# Patient Record
Sex: Male | Born: 1950 | Race: White | Hispanic: No | Marital: Married | Smoking: Never smoker
Health system: Southern US, Community
[De-identification: ages and names within clinical notes are randomized; demographics above are authoritative.]

## PROBLEM LIST (undated history)

## (undated) VITALS — BP 117/72 | HR 86 | Temp 97.0°F | Resp 18 | Ht 73.0 in | Wt 202.0 lb

## (undated) DIAGNOSIS — I469 Cardiac arrest, cause unspecified: Secondary | ICD-10-CM

## (undated) DIAGNOSIS — L0390 Cellulitis, unspecified: Secondary | ICD-10-CM

## (undated) DIAGNOSIS — F32A Depression, unspecified: Secondary | ICD-10-CM

## (undated) DIAGNOSIS — J45909 Unspecified asthma, uncomplicated: Secondary | ICD-10-CM

## (undated) DIAGNOSIS — F419 Anxiety disorder, unspecified: Secondary | ICD-10-CM

## (undated) DIAGNOSIS — J329 Chronic sinusitis, unspecified: Secondary | ICD-10-CM

## (undated) DIAGNOSIS — X838XXA Intentional self-harm by other specified means, initial encounter: Secondary | ICD-10-CM

## (undated) DIAGNOSIS — G4733 Obstructive sleep apnea (adult) (pediatric): Secondary | ICD-10-CM

## (undated) DIAGNOSIS — T7840XA Allergy, unspecified, initial encounter: Secondary | ICD-10-CM

## (undated) DIAGNOSIS — H269 Unspecified cataract: Secondary | ICD-10-CM

## (undated) DIAGNOSIS — F319 Bipolar disorder, unspecified: Secondary | ICD-10-CM

## (undated) DIAGNOSIS — M1990 Unspecified osteoarthritis, unspecified site: Secondary | ICD-10-CM

## (undated) DIAGNOSIS — E785 Hyperlipidemia, unspecified: Secondary | ICD-10-CM

## (undated) DIAGNOSIS — Z9989 Dependence on other enabling machines and devices: Secondary | ICD-10-CM

## (undated) DIAGNOSIS — G709 Myoneural disorder, unspecified: Secondary | ICD-10-CM

## (undated) DIAGNOSIS — K219 Gastro-esophageal reflux disease without esophagitis: Secondary | ICD-10-CM

## (undated) DIAGNOSIS — F99 Mental disorder, not otherwise specified: Secondary | ICD-10-CM

## (undated) DIAGNOSIS — M810 Age-related osteoporosis without current pathological fracture: Secondary | ICD-10-CM

## (undated) DIAGNOSIS — I10 Essential (primary) hypertension: Secondary | ICD-10-CM

## (undated) DIAGNOSIS — F329 Major depressive disorder, single episode, unspecified: Secondary | ICD-10-CM

## (undated) DIAGNOSIS — E079 Disorder of thyroid, unspecified: Secondary | ICD-10-CM

## (undated) DIAGNOSIS — I82409 Acute embolism and thrombosis of unspecified deep veins of unspecified lower extremity: Secondary | ICD-10-CM

## (undated) HISTORY — DX: Hyperlipidemia, unspecified: E78.5

## (undated) HISTORY — PX: BUNIONECTOMY: SHX129

## (undated) HISTORY — DX: Chronic sinusitis, unspecified: J32.9

## (undated) HISTORY — DX: Obstructive sleep apnea (adult) (pediatric): G47.33

## (undated) HISTORY — DX: Age-related osteoporosis without current pathological fracture: M81.0

## (undated) HISTORY — DX: Unspecified asthma, uncomplicated: J45.909

## (undated) HISTORY — DX: Major depressive disorder, single episode, unspecified: F32.9

## (undated) HISTORY — PX: COLON SURGERY: SHX602

## (undated) HISTORY — DX: Essential (primary) hypertension: I10

## (undated) HISTORY — PX: CHOLECYSTECTOMY: SHX55

## (undated) HISTORY — DX: Anxiety disorder, unspecified: F41.9

## (undated) HISTORY — DX: Bipolar disorder, unspecified: F31.9

## (undated) HISTORY — DX: Cellulitis, unspecified: L03.90

## (undated) HISTORY — DX: Dependence on other enabling machines and devices: Z99.89

## (undated) HISTORY — DX: Unspecified cataract: H26.9

## (undated) HISTORY — DX: Depression, unspecified: F32.A

## (undated) HISTORY — DX: Gastro-esophageal reflux disease without esophagitis: K21.9

## (undated) HISTORY — DX: Myoneural disorder, unspecified: G70.9

## (undated) HISTORY — DX: Unspecified osteoarthritis, unspecified site: M19.90

## (undated) HISTORY — DX: Allergy, unspecified, initial encounter: T78.40XA

## (undated) HISTORY — DX: Disorder of thyroid, unspecified: E07.9

## (undated) HISTORY — PX: FOOT SURGERY: SHX648

---

## 1996-06-17 HISTORY — PX: NEPHRECTOMY: SHX65

## 1996-06-17 HISTORY — PX: KIDNEY SURGERY: SHX687

## 1997-10-13 ENCOUNTER — Inpatient Hospital Stay (HOSPITAL_COMMUNITY): Admission: AD | Admit: 1997-10-13 | Discharge: 1997-10-17 | Payer: Self-pay | Admitting: Psychiatry

## 1999-05-14 ENCOUNTER — Encounter: Payer: Self-pay | Admitting: Urology

## 1999-05-14 ENCOUNTER — Encounter: Admission: RE | Admit: 1999-05-14 | Discharge: 1999-05-14 | Payer: Self-pay | Admitting: Urology

## 1999-11-13 ENCOUNTER — Encounter: Payer: Self-pay | Admitting: Urology

## 1999-11-13 ENCOUNTER — Encounter: Admission: RE | Admit: 1999-11-13 | Discharge: 1999-11-13 | Payer: Self-pay | Admitting: Urology

## 2000-05-12 ENCOUNTER — Encounter: Admission: RE | Admit: 2000-05-12 | Discharge: 2000-05-12 | Payer: Self-pay | Admitting: Urology

## 2000-05-12 ENCOUNTER — Encounter: Payer: Self-pay | Admitting: Urology

## 2000-05-16 ENCOUNTER — Ambulatory Visit (HOSPITAL_COMMUNITY): Admission: RE | Admit: 2000-05-16 | Discharge: 2000-05-16 | Payer: Self-pay | Admitting: Urology

## 2000-05-16 ENCOUNTER — Encounter: Payer: Self-pay | Admitting: Urology

## 2000-05-23 ENCOUNTER — Encounter: Payer: Self-pay | Admitting: Urology

## 2000-05-23 ENCOUNTER — Ambulatory Visit (HOSPITAL_COMMUNITY): Admission: RE | Admit: 2000-05-23 | Discharge: 2000-05-23 | Payer: Self-pay | Admitting: Urology

## 2000-05-23 ENCOUNTER — Encounter (INDEPENDENT_AMBULATORY_CARE_PROVIDER_SITE_OTHER): Payer: Self-pay | Admitting: Specialist

## 2006-08-07 ENCOUNTER — Emergency Department (HOSPITAL_COMMUNITY): Admission: EM | Admit: 2006-08-07 | Discharge: 2006-08-07 | Payer: Self-pay | Admitting: Emergency Medicine

## 2007-05-08 ENCOUNTER — Ambulatory Visit (HOSPITAL_BASED_OUTPATIENT_CLINIC_OR_DEPARTMENT_OTHER): Admission: RE | Admit: 2007-05-08 | Discharge: 2007-05-08 | Payer: Self-pay | Admitting: Internal Medicine

## 2007-05-08 ENCOUNTER — Ambulatory Visit: Payer: Self-pay | Admitting: Internal Medicine

## 2007-05-11 ENCOUNTER — Ambulatory Visit: Payer: Self-pay | Admitting: Internal Medicine

## 2007-05-31 ENCOUNTER — Observation Stay (HOSPITAL_COMMUNITY): Admission: RE | Admit: 2007-05-31 | Discharge: 2007-06-01 | Payer: Self-pay | Admitting: Family Medicine

## 2007-05-31 ENCOUNTER — Ambulatory Visit: Payer: Self-pay | Admitting: Family Medicine

## 2007-05-31 ENCOUNTER — Ambulatory Visit: Payer: Self-pay | Admitting: *Deleted

## 2007-05-31 ENCOUNTER — Encounter: Payer: Self-pay | Admitting: Emergency Medicine

## 2007-06-02 ENCOUNTER — Ambulatory Visit: Payer: Self-pay | Admitting: Internal Medicine

## 2007-06-02 DIAGNOSIS — J45909 Unspecified asthma, uncomplicated: Secondary | ICD-10-CM | POA: Insufficient documentation

## 2007-06-02 DIAGNOSIS — C649 Malignant neoplasm of unspecified kidney, except renal pelvis: Secondary | ICD-10-CM | POA: Insufficient documentation

## 2007-06-02 DIAGNOSIS — F319 Bipolar disorder, unspecified: Secondary | ICD-10-CM | POA: Insufficient documentation

## 2007-06-02 DIAGNOSIS — G4733 Obstructive sleep apnea (adult) (pediatric): Secondary | ICD-10-CM

## 2007-06-02 DIAGNOSIS — R0609 Other forms of dyspnea: Secondary | ICD-10-CM

## 2007-06-02 DIAGNOSIS — E785 Hyperlipidemia, unspecified: Secondary | ICD-10-CM

## 2007-06-02 DIAGNOSIS — R0989 Other specified symptoms and signs involving the circulatory and respiratory systems: Secondary | ICD-10-CM | POA: Insufficient documentation

## 2007-06-20 ENCOUNTER — Ambulatory Visit: Payer: Self-pay | Admitting: Internal Medicine

## 2007-06-24 ENCOUNTER — Encounter: Admission: RE | Admit: 2007-06-24 | Discharge: 2007-06-24 | Payer: Self-pay | Admitting: Emergency Medicine

## 2007-07-15 ENCOUNTER — Ambulatory Visit: Payer: Self-pay | Admitting: Internal Medicine

## 2007-11-06 ENCOUNTER — Telehealth: Payer: Self-pay | Admitting: *Deleted

## 2007-11-11 ENCOUNTER — Ambulatory Visit: Payer: Self-pay | Admitting: Sports Medicine

## 2007-11-11 DIAGNOSIS — M7750 Other enthesopathy of unspecified foot: Secondary | ICD-10-CM | POA: Insufficient documentation

## 2007-11-12 ENCOUNTER — Encounter: Payer: Self-pay | Admitting: Sports Medicine

## 2007-11-12 ENCOUNTER — Ambulatory Visit: Payer: Self-pay | Admitting: Internal Medicine

## 2008-04-13 ENCOUNTER — Observation Stay (HOSPITAL_COMMUNITY): Admission: EM | Admit: 2008-04-13 | Discharge: 2008-04-14 | Payer: Self-pay | Admitting: Emergency Medicine

## 2008-05-16 ENCOUNTER — Ambulatory Visit: Payer: Self-pay | Admitting: Internal Medicine

## 2008-11-15 ENCOUNTER — Ambulatory Visit: Payer: Self-pay | Admitting: Internal Medicine

## 2009-01-18 ENCOUNTER — Encounter: Payer: Self-pay | Admitting: Internal Medicine

## 2009-01-20 ENCOUNTER — Encounter: Payer: Self-pay | Admitting: Internal Medicine

## 2009-05-19 ENCOUNTER — Ambulatory Visit: Payer: Self-pay | Admitting: Internal Medicine

## 2009-08-16 ENCOUNTER — Telehealth: Payer: Self-pay | Admitting: Internal Medicine

## 2009-08-21 ENCOUNTER — Encounter: Payer: Self-pay | Admitting: Internal Medicine

## 2009-08-29 ENCOUNTER — Encounter: Payer: Self-pay | Admitting: Internal Medicine

## 2009-09-16 ENCOUNTER — Telehealth: Payer: Self-pay | Admitting: Internal Medicine

## 2009-11-15 ENCOUNTER — Ambulatory Visit: Payer: Self-pay | Admitting: Internal Medicine

## 2010-05-14 ENCOUNTER — Ambulatory Visit: Payer: Self-pay | Admitting: Internal Medicine

## 2010-07-17 NOTE — Assessment & Plan Note (Signed)
Summary: rov 6 months///kp   Copy to:  Daub Primary Provider/Referring Provider:  Cleta Alberts  CC:  6 month follow up.  Pt states he is wearing cpap every night for 7-8hours.  States pressure is doing better since it was increased.  Denies problems with mask.  Pt does c/o increased daytime sleepiness since starting on lorazepam by Dr. Awilda Metro.Marland Kitchen  History of Present Illness: 05/16/08- OSA, DOE, hx renal cancer chemo Comfortable with cpap 12, [facility] full face mask. Occ wakes dry mouth. Discussed humidifier and cpap settings.  11/15/08- OSA, DOE, hx renal cancer chemo Mentions he is getting androgel injections for fatigue by Earl Lites. Says this is lack of get up and go, not sleepiness. Admits he stays up too late. Aware of weight gain limited by aching joints after his chemo therapy. Sleeps comfortably, with occasional insomnia. He uses ambien occasionally. Naps on weekends help. Caffeine after lunch may be a problem. CPAP 12 is comfortable, full face mask.. Talks about taking dog to the ER at midnight as reason he is tired.  May 19, 2009- OSA, DOE, Hx Renal Cancer chemo Uses CPAP every night- comfortable. He would like to lose 20 lbs and we discussed changes in cpap. Been a little tireder recently- mutifactorial. Minor chest congestion this week- going through family.  November 15, 2009- OSA, DOE, Hx Renal Cancer chemo He got ? steroid injection and prednisone taper this Spring from Dr Lyla Son office for allergy. That may be what triggered anxiety for which he got lorazepam from Dr Plovsky/ Psychiatry. Lorazepam is making him sleepy in day. Sleeps ok at night with occasional nocturia or heartburn. He remains compliant with CPAP, liking the pressure better since we raised it to 14. Uses it all night, every night. He has gained some weight and understands that weight loss will change pressure requirement and mask fit.   Preventive Screening-Counseling & Management  Alcohol-Tobacco     Smoking  Status: never  Current Medications (verified): 1)  Lithium Carbonate 300 Mg  Tbcr (Lithium Carbonate) .... Take 1 By Mouth Once Daily 2)  Depakote Er 500 Mg  Tb24 (Divalproex Sodium) .... Take 1 By Mouth Once Daily 3)  Paxil 20 Mg  Tabs (Paroxetine Hcl) .... Take 1 By Mouth Once Daily 4)  Pravastatin Sodium 40 Mg  Tabs (Pravastatin Sodium) .... Take 1 By Mouth Once Daily 5)  Cpap 14 Cwp [facility] 6)  Testosterone Enanthate 200 Mg/ml Oil (Testosterone Enanthate) .Marland Kitchen.. 1 Cc Every 2 Weeks 7)  Lorazepam 0.5 Mg Tabs (Lorazepam) .Marland Kitchen.. 1 in Am and 2 in Pm 8)  Multivitamins  Tabs (Multiple Vitamin) .... Take 1 Tablet By Mouth Once A Day 9)  Fish Oil 1200 Mg Caps (Omega-3 Fatty Acids) .... Take 1 Capsule By Mouth Once A Day 10)  Bioflex  Tabs (Bioflavonoid Products) .... Take 1 Tablet By Mouth Two Times A Day  Allergies (verified): 1)  ! * Contrast Dye  Past History:  Past Medical History: Last updated: 11/11/2007 Renal cell carcinoma - chemo and xrt-- lost R kidney  Childhood asthma Sinusitis cellulitis after left foot surgery Osteoarthritis Obstructive sleep apnea--NPSG 05/08/07 AHI 63.1/hr, desat to 80%,loud snoring bipolar (current medications of paxil, depakote, low-dose lithium) cholesterol  see intake sheet  Past Surgical History: Last updated: 07/15/2007 Podiatric surgery on left foot toes, bunionecrtomy  Family History: Last updated: 11/25/2007 Hypertension Sleep Apnea Mother-living age 16 Father-living age 57; emphysema, prostate cancer Brother- living age 52 Sister-living age 64  Social History: Last updated:  11/25/2007 Patient never smoked.  Positive history of passive tobacco smoke exposure.  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    Vital Signs:  Patient profile:   60 year old male Height:      74 inches Weight:      225 pounds BMI:     28.99 O2 Sat:      98 % on Room air Pulse rate:   59 / minute BP sitting:   118 / 80  (right arm) Cuff size:   regular  Vitals Entered By: Gweneth Dimitri RN (November 15, 2009 10:31 AM)  O2 Flow:  Room air CC: 6 month follow up.  Pt states he is wearing cpap every night for 7-8hours.  States pressure is doing better since it was increased.  Denies problems with mask.  Pt does c/o increased daytime sleepiness since starting on lorazepam by Dr. Awilda Metro. Comments Medications reviewed with patient Daytime contact number verified with patient. Gweneth Dimitri RN  November 15, 2009 10:31 AM    Physical Exam  Additional Exam:  General: A/Ox3; pleasant and cooperative, NAD, ca;lm and conversational SKIN: no rash, lesions NODES: no lymphadenopathy HEENT: [place]/AT, EOM- WNL, Conjuctivae- clear, PERRLA, TM-WNL, Nose- clear, Throat- clear and wnl, Mallampati III, long uvula NECK: Supple w/ fair ROM, JVD- none, normal carotid impulses w/o bruits Thyroid-  CHEST: Clear to P&A HEART: RRR, no m/g/r heard ABDOMEN: Soft  QMV:HQIO, nl pulses, no edema  NEURO:  tremor with intention, seems worse.     Impression & Recommendations:  Problem # 1:  OBSTRUCTIVE SLEEP APNEA (ICD-327.23)  Good control and pressure is well tolerated. We discussed sleep hygiene.  Problem # 2:  ASTHMA, CHILDHOOD (ICD-493.00) Not having active problems to require care.  Medications Added to Medication List This Visit: 1)  Lorazepam 0.5 Mg Tabs (Lorazepam) .Marland Kitchen.. 1 in am and 2 in pm 2)   Multivitamins Tabs (Multiple vitamin) .... Take 1 tablet by mouth once a day 3)  Fish Oil 1200 Mg Caps (Omega-3 fatty acids) .... Take 1 capsule by mouth once a day 4)  Bioflex Tabs (Bioflavonoid products) .... Take 1 tablet by mouth two times a day  Other Orders: Est. Patient Level III (96295)  Patient Instructions: 1)  Please schedule a follow-up appointment in 6 months. 2)  Continue CPAP at 14. Call if that seems not to be working right for you.

## 2010-07-17 NOTE — Progress Notes (Signed)
Summary: Autotitrated to cpap 14- will change  Phone Note Other Incoming   Summary of Call: CPAP autotitration 08/31/09- needs CPAP increased to 14 cwp Initial call taken by: Waymon Budge MD,  September 16, 2009 7:14 PM  Follow-up for Phone Call        Spoke with pt and he is aware of pressure change. He is aware that we faxed an order to [facility] to increase his pressure 14 cwp. Rhonda Cobb  September 18, 2009 9:34 AM     New/Updated Medications: * CPAP 14 CWP [facility]

## 2010-07-17 NOTE — Assessment & Plan Note (Signed)
Summary: 6 months/apc   Copy to:  Daub Primary Provider/Referring Provider:  Cleta Alberts  CC:  6 month follow up visit-sleep; Uses CPAP regularly-only missed one night of use due to being out of town; Having Dry mouth due to mouth breathing.[name]  History of Present Illness: May 19, 2009- OSA, DOE, Hx Renal Cancer chemo Uses CPAP every night- comfortable. He would like to lose 20 lbs and we discussed changes in cpap. Been a little tireder recently- mutifactorial. Minor chest congestion this week- going through family.  November 15, 2009- OSA, DOE, Hx Renal Cancer chemo He got ? steroid injection and prednisone taper this Spring from Dr Lyla Son office for allergy. That may be what triggered anxiety for which he got lorazepam from Dr Plovsky/ Psychiatry. Lorazepam is making him sleepy in day. Sleeps ok at night with occasional nocturia or heartburn. He remains compliant with CPAP, liking the pressure better since we raised it to 14. Uses it all night, every night. He has gained some weight and understands that weight loss will change pressure requirement and mask fit.  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 Blames onset of dry mouth with use of lorazepam, now dc'd. Discussed nasal stuffiness and mouth breathing. Using CPAP every night and sleeps much better with it.      Preventive Screening-Counseling & Management  Alcohol-Tobacco     Smoking Status: never     Passive Smoke Exposure: yes  Current Medications (verified): 1)  Lithium Carbonate 300 Mg  Tbcr (Lithium Carbonate) .... Take 1 By Mouth Once Daily 2)  Depakote Er 500 Mg  Tb24 (Divalproex Sodium) .... Take 1 By Mouth Once Daily 3)  Paxil 20 Mg  Tabs (Paroxetine Hcl) .... Take 1 By Mouth Once Daily 4)  Pravastatin Sodium 40 Mg  Tabs (Pravastatin Sodium) .... Take 1 By  Mouth Once Daily 5)  Cpap 14 Cwp [facility] 6)  Testosterone Enanthate 200 Mg/ml Oil (Testosterone Enanthate) .[name].. 1 Cc Every 2 Weeks 7)  Multivitamins  Tabs (Multiple Vitamin) .... Take 1 Tablet By Mouth Once A Day 8)  Fish Oil 1200 Mg Caps (Omega-3 Fatty Acids) .... Take 1 Capsule By Mouth Two Times A Day 9)  Bioflex  Tabs (Bioflavonoid Products) .... Take 1 Tablet By Mouth Two Times A Day  Allergies (verified): 1)  ! * Contrast Dye  Past History:  Past Medical History: Last updated: 11/11/2007 Renal cell carcinoma - chemo and xrt-- lost R kidney  Childhood asthma Sinusitis cellulitis after left foot surgery Osteoarthritis Obstructive sleep apnea--NPSG 05/08/07 AHI 63.1/hr, desat to 80%,loud snoring bipolar (current medications of paxil, depakote, low-dose lithium) cholesterol  see intake sheet  Past Surgical History: Last updated: 07/15/2007 Podiatric surgery on left foot toes, bunionecrtomy  Family History: Last updated: 11/25/2007 Hypertension Sleep Apnea Mother-living age 14 Father-living age 33; emphysema, prostate cancer Brother- living age 45 Sister-living age 2  Social History: Last updated: 11/25/2007 Patient never smoked.  Positive history of passive tobacco smoke exposure.  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  The patient denies shortness  of breath with activity, shortness of breath at rest, productive cough, non-productive cough, coughing up blood, chest pain, irregular heartbeats, acid heartburn, indigestion, loss of appetite, weight change, abdominal pain, difficulty swallowing, sore throat, tooth/dental problems, headaches, and sneezing.    Vital Signs:  Patient profile:   60 year old  male Height:      74 inches Weight:      220 pounds BMI:     28.35 O2 Sat:      96 % on Room air Pulse rate:   70 / minute BP sitting:   130 / 72  (right arm) Cuff size:   regular  Vitals Entered By: Reynaldo Minium CMA (May 14, 2010 11:09 AM)  O2 Flow:  Room air CC: 6 month follow up visit-sleep; Uses CPAP regularly-only missed one night of use due to being out of town; Having Dry mouth due to mouth breathing.   Physical Exam  Additional Exam:  General: A/Ox3; pleasant and cooperative, NAD, ca;lm and conversational SKIN: no rash, lesions NODES: no lymphadenopathy HEENT: [place]/AT, EOM- WNL, Conjuctivae- clear, PERRLA, TM-WNL, Nose- clear, Throat- clear and wnl, Mallampati III, long uvula, mild dry to tongue blade.  NECK: Supple w/ fair ROM, JVD- none, normal carotid impulses w/o bruits Thyroid-  CHEST: Clear to P&A HEART: RRR, no m/g/r heard ABDOMEN: Soft  EAV:WUJW, nl pulses, no edema  NEURO:  tremor with intention, significant     Impression & Recommendations:  Problem # 1:  OBSTRUCTIVE SLEEP APNEA (ICD-327.23)  We discussed potential for CPAP with humidifier to cause some dry  mouth. Some of his meds might also do it, as well as winter heat. We will let im also try  Biotine. Discussed normal sleep hygiene.  Problem # 2:  DYSPNEA ON EXERTION (ICD-786.09) Currently he associates dyspnea mainly w/ deconditioning, without rec ent change, or cough/wheeze/chest pain.  Medications Added to Medication List This Visit: 1)  Fish Oil 1200 Mg Caps (Omega-3 fatty acids) .... Take 1 capsule by mouth two times a day  Other Orders: Est. Patient Level III (11914)  Patient Instructions: 1)  Please schedule a follow-up appointment in 1 year. 2)  For dry mouth, getting enough water, reducing indoor heat at night, running a humidifier, or using a mouth moisture replacement like Biotine (otc) all may help

## 2010-07-17 NOTE — Progress Notes (Signed)
Summary: order request/ cpap -LMTCB  Phone Note Call from Patient Call back at Work Phone [phone]   Caller: Patient Call For: young Summary of Call: pt wants cpap pressure changed. says [facility] is waiting for an order.  Initial call taken by: Tivis Ringer, CNA,  August 16, 2009 10:00 AM  Follow-up for Phone Call        [facility] Carron Curie CMA  August 16, 2009 11:01 AM  Called and spoke with pt.  He states that lately, he has not been feeling as "refreshed" as he used to in the am.  He also states that  he has had some wt gain.  Pt thinks that he needs cpap titration study done.  Please advise, thanks Vernie Murders  August 21, 2009 4:39 PM    Additional Follow-up for Phone Call Additional follow up Details #1::        Will order CPAP titration through [facility]. Additional Follow-up by: Waymon Budge MD,  August 21, 2009 5:16 PM    Additional Follow-up for Phone Call Additional follow up Details #2::    order palced. Carron Curie CMA  August 21, 2009 5:23 PM

## 2010-07-17 NOTE — Letter (Signed)
Summary: LMN for 5 night auto titration/[facility]  LMN for 5 night auto titration/[facility]   Imported By: Sherian Rein 08/24/2009 12:16:52  _____________________________________________________________________  External Attachment:    Type:   Image     Comment:   External Document

## 2010-10-30 NOTE — Discharge Summary (Signed)
[name], [name]                  ACCOUNT NO.:  [id number]   MEDICAL RECORD NO.:  [id number]          PATIENT TYPE:  INP   LOCATION:  4707                         FACILITY:  [facility]   PHYSICIAN:  Ritta Slot, MD     DATE OF BIRTH:  [birth year]   DATE OF ADMISSION:  04/13/2008  DATE OF DISCHARGE:  04/14/2008                               DISCHARGE SUMMARY   DISCHARGE DIAGNOSES:  1. Chest pain, worrisome for unstable angina, myocardial infarction      ruled out.  2. History of renal cell cancer, status post nephrectomy in 1998 with      recurrence, treated with inferior vena cava reconstruction and      radiation therapy in 2003.  3. Sleep apnea, on CPAP.  4. History of bipolar disorder.  5. Degenerative joint disease.   HOSPITAL COURSE:  The patient is a pleasant 60 year old male who has  been followed by Dr. Cleta Alberts.  He has no history of coronary disease.  He  was sent to the emergency room by his primary care doctor after he  presented there with a history of substernal chest pain.  The patient  described his pain as heartburn.  He did get partial relief with Tums  but his symptoms recurred.  His wife became concerned and insisted he go  and see his family doctor.  In Dr. Ellis Parents office, he was given 4 aspirin  and sent to the ER at [facility].  He continued to have some midsternal chest  pain at [facility].  Symptoms did not radiate to his jaw or arms.  He does  admit to some dyspnea on exertion but states this has been chronic.  The  patient was admitted with unstable angina for observation.  He was  started on heparin and IV nitrates.  Enzymes were obtained.  These were  negative x2.  His EKG shows sinus rhythm, sinus bradycardia without  acute changes.  When seen by Dr. Lynnea Ferrier on April 14, 2008, we felt he  can be discharged.  He will get followup Myoview as an outpatient and  then see Dr. Lynnea Ferrier back.   LABORATORY DATA:  As noted EKG shows sinus rhythm without acute changes.  Chest  x-ray shows INR was 1.1, TSH 2.39, troponins were negative x2,  liver functions were normal.  Sodium 139, potassium 4.8, BUN 29,  creatinine 1.93.  White count 4.0, hemoglobin 13.1, hematocrit 39.1,  platelets 119.   DISCHARGE MEDICATIONS:  1. Coated aspirin once a day.  2. Prilosec 20 mg a day.   The patient will continue his home dose of lithium 300 mg nightly,  Depakote 500 mg nightly, Paxil 20 mg a day nightly, and pravastatin 40  mg nightly, and AndroGel as taken at home.  We have also added Prilosec  20 mg a day and coated aspirin.   DISCHARGE DIAGNOSES:  1. Chest pain, myocardial infarction ruled out.  2. History of renal cell cancer status post nephrectomy in 1998 with      recurrence, treated with inferior vena cava reconstruction and  radiation in 2003 at [facility].  3. Renal insufficiency with a creatinine of 1.9.  4. Bipolar disorder, controlled with medications.  5. Thrombocytopenia.  6. Sleep apnea, on CPAP.  7. Degenerative joint disease.  The patient does see a chiropractor      for some back issues.   PLAN:  The patient will be set up for a Persantine Myoview as an  outpatient.  He will follow up with Dr. Lynnea Ferrier after this.      Abelino Derrick, P.A.      Ritta Slot, MD  Electronically Signed    LKK/MEDQ  D:  04/14/2008  T:  04/15/2008  Job:  [id number]   cc:   Brett Canales A. Cleta Alberts, M.D.

## 2010-10-30 NOTE — Discharge Summary (Signed)
[name], [name]                  ACCOUNT NO.:  [id number]   MEDICAL RECORD NO.:  [id number]          PATIENT TYPE:  INP   LOCATION:  5032                         FACILITY:  [facility]   PHYSICIAN:  Pearlean Brownie, M.D.DATE OF BIRTH:  [birth year]   DATE OF ADMISSION:  05/31/2007  DATE OF DISCHARGE:  06/01/2007                               DISCHARGE SUMMARY   ADMISSION DIAGNOSIS:  Left lower extremity edema.   DISCHARGE DIAGNOSIS:  Resolved left lower extremity edema.   LABORATORY DATA:  CBC on admission showed white blood cell count 4.9,  hemoglobin 12, hematocrit 34.  Basic metabolic panel; sodium 137,  potassium 4.5, chloride 102, CO2 28, glucose 104, BUN 23, serum  creatinine 2.13, calcium 9.2.  Follow-up CBC which was done later in the  evening on December 14 as well showed white blood cell count 3.5,  hemoglobin 11.7, hematocrit 34.  Follow-up complete metabolic panel;  sodium 140, potassium 4.3, chloride 103, CO2 28, serum creatinine 1.9.  Lithium level done on December 14 was 0.27 with a reference range of 0.8  to 1.4.  Blood cultures were negative x2 days.   DISCHARGE MEDICATIONS:  Keflex 500 mg every 6 hours for 10 days.   FOLLOWUP:  He is to follow up with his podiatrist, Dr. Charlsie Merles, today.  Dr. Charlsie Merles has been informed.   HOSPITAL COURSE:  A 60 year old gentleman whose past medical history was  significant for renal cell carcinoma and bipolar disorder who presented  three weeks postoperative from a hammertoe repair and bunion surgery in  his left foot at [facility].  He had an unremarkable  postoperative course until December 13, when in the morning he began to  notice increased edema in his toes and foot.  He noticed it was more  painful to walk around.  That evening when he took off his boot to  change his dressing, he noted edema to mid calf, erythema, warmth, and  tenderness.  He denied fever, chills, drainage from incisions.  He had  sutures removed on  Monday, December 8.  The patient still has two pins  in place.  He presented to [facility] ED where x-  rays showed bony lucency concerning for osteomyelitis versus  postoperative changes.  He was given one dose of Ancef and transferred  to [facility] for admission.  He has no previous history of any  blood clots.  On admission it was noted that he had 2+ pitting edema to  mid calf, erythema, warmth, swelling from his toes to mid calf.  He was  mildly tender to palpation and there were no cords palpated and there  was no drainage from incisions.  He had good pulses bilaterally.  His  left calf was much larger than his right calf.  Neurologic examination  showed no focal changes.  The radiology report impression was that there  were changes that could not be differentiated between acute versus  chronic, that considerations would include osteomyelitis, Charcot  joints, or an unusual destructive arthropathy and that there was soft  tissue swelling  which could indicate a superimposed cellulitis.  He was  started on Vancomycin IV.  The patient also had Dopplers of his left  lower extremity to rule out a DVT, no signs of DVT were found.  He did  remarkably well and by the next morning edema, swelling, and pain had  all significantly improved.  His renal insufficiency had also improved  with his serum creatinine improving from 2.13 to 1.9.  He was continued  on his medicines for bipolar and had no changes in mood.  His labs also  showed no signs of liver damage and he was continued on his statin.  A  telephone call was made to his podiatrist who agreed to see him upon  discharge, so  the patient was switched to p.o. Keflex and instructed to follow up with  Dr. Charlsie Merles.  The patient verbalized understanding and had no other  questions or concerns.  He was again discharged in stable condition with  follow-up with Dr. Charlsie Merles scheduled.      [name], M.D.   Electronically Signed      Pearlean Brownie, M.D.  Electronically Signed    JH/MEDQ  D:  06/01/2007  T:  06/01/2007  Job:  [id number]   cc:   Lenn Sink, D.P.M.

## 2010-10-30 NOTE — Assessment & Plan Note (Signed)
[facility]                             PULMONARY OFFICE NOTE   NAME:ROYALHarden, Bramer                         MRN:          [id number]  DATE:05/08/2007                            DOB:          [birth year]    PROBLEM:  This is a 60 year old man seen through the courtesy of Dr.  Cleta Alberts in pulmonary consultation because of fatigue and reduced lung  capacity questioning sleep apnea.   HISTORY:  He comes with his wife. He has a complicated medical history.  Since March 2007, he has noticed easy exertional dyspnea primarily on  hills and stairs. There may have been some gradual progression, but that  is not definite. He feels comfortable walking as much as he wants on  flat surfaces and is not dyspneic sitting or in bed. He tires more  easily on an elliptical trainer at the gym than he used to. He does not  recognize any sudden event. His wife and he are now in separate bedrooms  because of his loud snoring, and she says that he is sleepy all of the  time. He admits that he falls asleep quickly if sitting and has to fight  sleepiness sometimes while driving. We discussed his responsibility for  safe driving.   He had a full physical exam on September 23 with Dr. Cleta Alberts and indicates  that he is not anemic. Chest x-ray from that visit is available and  shows clear lung parenchyma, normal heart size, mild elevation of the  left hemidiaphragm with large gastric air bubbles suggesting air  swallowing. He brings spirometry from that workup showing mild to  moderate restriction of exhaled volume with an FVC 67% of predicted, and  additional mild obstructive defect with an FEV1 62% of predicted. The  FEV1/FVC ratio was normal at 95%, but small airway flows are reduced to  49% of predicted.   MEDICATIONS:  1. Lithium carbonate 350 mg.  2. Depakote ER 500 mg.  3. Paxil 20 mg.  4. Pravastatin 40 mg.   Drug intolerant to CONTRAST DYE.   REVIEW OF SYSTEMS:  Weight is  stable. He denies purulent or bloody  sputum, adenopathy, rash, fever, chest pain, palpitation, leg edema, or  leg pain.   PAST HISTORY:  Bipolar disorder, childhood asthma, elevated cholesterol.  Right nephrectomy for renal cell carcinoma which was recurrent and  managed at [facility]. He had much chemotherapy and radiation therapy  including immunotherapy for this problem. He developed a peripheral  neuropathy while on Flutamide, but when the drug was withdrawn, the  neuropathy improved. He has no history of clotting disorder or anemia.  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 He has had degenerative  arthritis in the knee and back. Previous bunion surgery in 1984 and  surgery on a toe on his right foot in 2008. Cholecystectomy in 1987. He  is pending further foot surgery for hammer toe. Chronic tremor because  of which he strictly avoids caffeine. Allergy management including  allergy vaccine in  the past, by Dr. Stevphen Rochester.   SOCIAL HISTORY:  Never smoked. Avoids caffeine. 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   FAMILY HISTORY:  Positive for asthma.   OBJECTIVE:  Weight 212 pounds, blood pressure 122/70, pulse regular 62,  room air saturation 99% at rest. He is tall, normal body build. There is  a mild head bob and resting tremor increased with intention. No visible  rash. Adenopathy not found.  HEENT:  Voice quality normal. Narrow nasal airways bilaterally with a  little mucus crusting. Palette spacing 2/4 with normal gag. No obvious  pharyngeal irritation. No strider, thyromegaly, or neck vein distension.  CHEST:  Quiet, clear lung fields without rales or crackle.  HEART:  Regular without murmur or gallop.  ABDOMEN:  Nondistended.  EXTREMITIES:  Without cyanosis, clubbing, or edema.   IMPRESSION:  1. Exertional dyspnea, non-specific. Several factors are probably       involved. We will want to measure pulmonary function tests to get      accurate impression of lung volumes and look for desaturation on a      walk test. His chest x-ray shows significant osteophyte formation      in the thoracic spine suggesting there is a component of      costovertebral joint stiffness contributing to his restrictive      pattern on spirometry. I do not know if he could have sustained a      cardiomyopathy from his chemotherapy.  2. Sleep apnea strongly suggested from wife's observations although      body habitus is not high risk. We will schedule a sleep study.   PLAN:  1. Pulmonary function testing including six minute walk test.  2. We are scheduling a split protocol sleep study at the [facility]      center, and I will see him back after these are completed.   I appreciate the chance to meet him and hope that I can be helpful.     Clinton D. Maple Hudson, MD, [facility] Bollman, FACP  Electronically Signed    CDY/MedQ  DD: 05/09/2007  DT: 05/10/2007  Job #: [id number]   cc:   Brett Canales A. Cleta Alberts, M.D.

## 2010-10-30 NOTE — Procedures (Signed)
NAME:  [name], [name]                  ACCOUNT NO.:  [id number]   MEDICAL RECORD NO.:  [id number]          PATIENT TYPE:  OUT   LOCATION:  SLEEP CENTER                 FACILITY:  [facility]   PHYSICIAN:  Clinton D. Maple Hudson, MD, FCCP, FACPDATE OF BIRTH:  January 02, 1951   DATE OF STUDY:  05/08/2007                            NOCTURNAL POLYSOMNOGRAM   REFERRING PHYSICIAN:  Clinton D. Young, MD, FCCP, FACP   INDICATIONS FOR PROCEDURE:  Hypersomnia with sleep apnea.   RESULTS:  Epward sleepiness score 23/24. BMI 26.3. Height 74 inches.  Weight 205 pounds. Neck 15.5 inches.   HOME MEDICATIONS:  Listed and reviewed.   SLEEP ARCHITECTURE:  Split study protocol. Diagnostic phase total sleep  time 124 minutes with sleep efficiency 85%. Stage 1 was 17%; Stage 2,  76%; Stage 3, absent. REM 6.4% of total sleep time. Awake after sleep  onset 14 minutes. Arousal index during diagnostic phase 56.9. Depakote,  Lithium, Paxil, and Parastatin were all taken at 10:15 p.m.   RESPIRATORY DATA:  During diagnostic phase, apnea hypopnea index (AHI)  63.1 obstructive events per hour, indicating severe obstructive sleep  apnea/hypopnea syndrome before CPAP. There were 16 obstr5uctive apnea's  and 115 hypopnea's, mostly associated with supine sleep position. CPAP  was titrated to 13 CWP, AHI zero per hour. A medium Mirage Quattro mask  was used with heated humidifier.   OXYGEN DATA:  Very loud snoring with oxygen desaturation to a nadir of  80% before CPAP. After CPAP control, mean oxygen saturation held 97.3%  on room air.   CARDIAC DATA:  Normal sinus rhythm.   MOVEMENT/PARASOMNIA:  No significant movement disturbance. Bathroom x2.   IMPRESSION:  1. Severe obstructive sleep apnea/hypopnea syndrome, AHI 63.1 per hour      with non-positional events, very loud snoring, and oxygen      desaturation to a nadir of 80%.  2. Successful CPAP titration to 13 CWP, AHI zero per hour. A medium      full face Comfort full  mask was used (correcting mask style noted      above) with heated humidifier.      Clinton D. Maple Hudson, MD, [facility], FACP  Diplomate, Biomedical engineer of Sleep Medicine  Electronically Signed     CDY/MEDQ  D:  05/24/2007 13:07:38  T:  05/24/2007 22:03:15  Job:  [id number]

## 2010-10-30 NOTE — Discharge Summary (Signed)
[name], [name]                  ACCOUNT NO.:  [id number]   MEDICAL RECORD NO.:  [id number]          PATIENT TYPE:  INP   LOCATION:  4707                         FACILITY:  [facility]   PHYSICIAN:  Ritta Slot, MD     DATE OF BIRTH:  01/30/1951   DATE OF ADMISSION:  04/13/2008  DATE OF DISCHARGE:  04/14/2008                               DISCHARGE SUMMARY   ADDENDUM   Mr. Schiro was also sent home with a prescription for nitroglycerin 0.4  mg sublingual p.r.n. for severe chest pain only.      Abelino Derrick, P.A.      Ritta Slot, MD  Electronically Signed    LKK/MEDQ  D:  04/14/2008  T:  04/15/2008  Job:  [id number]

## 2010-10-30 NOTE — H&P (Signed)
NAMEJAHID, WEIDA                  ACCOUNT NO.:  [id number]   MEDICAL RECORD NO.:  [id number]          PATIENT TYPE:  EMS   LOCATION:  MAJO                         FACILITY:  [facility]   PHYSICIAN:  Ritta Slot, MD     DATE OF BIRTH:  Jul 08, 1950   DATE OF ADMISSION:  04/13/2008  DATE OF DISCHARGE:                              HISTORY & PHYSICAL   CHIEF COMPLAINT:  Chest pain.   HISTORY OF PRESENT ILLNESS:  Mr. Petersen is a 60 year old male with no  prior history of coronary disease who was sent from Dr. Lynder Parents office  with a complaint of chest pain.  The patient has somewhat of a  complicated past medical history.  He has had renal cell cancer and was  treated with a nephrectomy in 1998.  He apparently had recurrence which  involved his inferior vena cava as well.  He underwent experimental  surgery to repair his inferior vena cava at [facility] in 2003, this was also  associated with radiation therapy.  He has done fairly well since then.  He has not had chest pain or prior cardiac workup.  Today at work, he  developed some burning in the mid chest.  He took a couple of Tums with  partial relief.  He mentioned it to his wife who insisted he call his  primary care doctor.  The patient came over to Dr. Lynder Parents office.  He  was given 4 baby aspirin there and sent to [facility] ER.  He still has mild  residual midsternal pain which he describes as heartburn-like.  He  denies any radiation to his jaw, into his arms or his back.  He does  admit to some dyspnea on exertion, but actually this is a chronic  complaint and not new.  He will be admitted now for further evaluation.   PAST MEDICAL HISTORY:  1. Remarkable for bipolar disorder which has been treated.  2. He has had renal cell cancer status post nephrectomy in 1998 with      recurrence involving the inferior vena cava as well, treated with      experimental surgery at [facility] and radiation in 2003.  3. He has sleep apnea which was diagnosed in  November 2008 and he is      on CPAP at home.  4. He has treated dyslipidemia.  5. He has had gallbladder surgery in 1987.  6. He has some arthritis issues in his knees and back.  7. He has a baseline chronic tremor of unclear etiology.   ALLERGIES:  NO KNOWN DRUG ALLERGIES.   CURRENT MEDICATIONS:  1. Depakote 500 mg h.s.  2. Paxil 10 mg h.s.  3. Lithium 300 mg h.s.  4. Pravastatin 40 mg h.s.  5. He is also recently been put on AndroGel topical.  6. He is on CPAP at home.   SOCIAL HISTORY:  He is married, he is employed as a Production designer, theatre/television/film.  He never  smoked.  He denies alcohol use.  He has no children.   FAMILY HISTORY:  Unremarkable for coronary disease, both his  parents are  alive, his father does have a history of prostate cancer.   REVIEW OF SYSTEMS:  Essentially unremarkable except for noted above.  He  denies any history of GI bleeding, melena or ulcers.  He has had some  back problems and sees a chiropractor, he went to see him earlier this  week for an adjustment and some stimulation therapy.  He has had foot  surgery November 2008 on the left foot for hammertoe.  This was  complicated by some persistent lower extremity edema on the left.  He  has had venous Doppler studies which have not shown DVT.   PHYSICAL EXAMINATION:  VITAL SIGNS:  Blood pressure 114/74, pulse 55,  temperature 97.5, respirations 12.  GENERAL:  He is a well-developed, well-nourished male in no acute  distress.  HEENT:  Normocephalic.  Extraocular movements are intact.  Sclerae  nonicteric.  He wears glasses.  NECK:  Without JVD or bruit.  Thyroid is not enlarged.  CHEST:  Clear to auscultation and percussion.  CARDIAC:  Reveals regular rate and rhythm without murmur, rub or gallop.  Normal S1 and S2.  ABDOMEN:  Nontender.  He has a right upper quadrant surgical scar.  EXTREMITIES:  Without edema.  Distal pulses are intact.  NEURO:  Grossly intact.  He is awake, alert, oriented and cooperative.  He  moves all extremities without obvious deficit.  SKIN:  Warm and dry.   DIAGNOSTICS:  EKG reveals sinus rhythm, sinus bradycardia without acute  changes.   LABORATORY DATA:  Pending.   IMPRESSION:  1. Chest pain consistent with unstable angina, new onset, rule out      myocardial infarction.  2. History of renal cell cancer treated with right nephrectomy in 1998      with recurrence in 2003, treated with experimental inferior vena      cava repair and radiation therapy.  3. Sleep apnea, on CPAP since November 2008.  4. Treated dyslipidemia.  5. Bipolar disorder, controlled with medications.  6. Degenerative joint disease especially involving the back.  7. Chronic baseline tremor.   PLAN:  The patient will be admitted to telemetry, started on heparin and  nitrates, rule out for an MI.  We will add a PPI.  Further workup, i.e.  cardiac catheterization versus possible outpatient Myoview will depend  on recurrence of his symptoms, labs and/or EKG changes.      Abelino Derrick, P.A.      Ritta Slot, MD  Electronically Signed    LKK/MEDQ  D:  04/13/2008  T:  04/13/2008  Job:  [id number]   cc:   Ritta Slot, MD  Illa Level, MD

## 2011-03-18 LAB — COMPREHENSIVE METABOLIC PANEL
AST: 26
CO2: 28
Calcium: 9.4
Creatinine, Ser: 1.93 — ABNORMAL HIGH
GFR calc Af Amer: 44 — ABNORMAL LOW
Potassium: 4.8
Total Bilirubin: 0.5

## 2011-03-18 LAB — PROTIME-INR
INR: 1
INR: 1.1

## 2011-03-18 LAB — DIFFERENTIAL
Basophils Absolute: 0
Basophils Relative: 0
Basophils Relative: 1
Eosinophils Relative: 4
Eosinophils Relative: 5
Lymphocytes Relative: 16
Lymphocytes Relative: 23
Lymphs Abs: 0.6 — ABNORMAL LOW
Monocytes Absolute: 0.3
Monocytes Absolute: 0.4
Monocytes Relative: 9
Neutro Abs: 2.1
Neutro Abs: 2.8
Neutrophils Relative %: 71

## 2011-03-18 LAB — CARDIAC PANEL(CRET KIN+CKTOT+MB+TROPI)
Relative Index: INVALID
Total CK: 62
Troponin I: 0.01

## 2011-03-18 LAB — CK TOTAL AND CKMB (NOT AT ARMC)
CK, MB: 1.6
Relative Index: INVALID

## 2011-03-18 LAB — CBC
HCT: 39.8
Hemoglobin: 12.9 — ABNORMAL LOW
MCHC: 33.6
MCV: 95.1
Platelets: 108 — ABNORMAL LOW
RDW: 12.9
RDW: 13.1
WBC: 4.5

## 2011-03-18 LAB — APTT: aPTT: >200

## 2011-03-18 LAB — HEPARIN LEVEL (UNFRACTIONATED): Heparin Unfractionated: 1.15 — ABNORMAL HIGH

## 2011-03-18 LAB — TROPONIN I: Troponin I: <0.01

## 2011-03-25 LAB — CBC
Hemoglobin: 11.7 — ABNORMAL LOW
MCHC: 34.5
MCV: 93.2
Platelets: 159
RBC: 3.65 — ABNORMAL LOW
RDW: 13.3
WBC: 3.5 — ABNORMAL LOW
WBC: 4.9

## 2011-03-25 LAB — CULTURE, BLOOD (ROUTINE X 2)
Culture: NO GROWTH
Culture: NO GROWTH

## 2011-03-25 LAB — URINALYSIS, ROUTINE W REFLEX MICROSCOPIC
Hgb urine dipstick: NEGATIVE
Ketones, ur: NEGATIVE
Nitrite: NEGATIVE
Urobilinogen, UA: 0.2

## 2011-03-25 LAB — DIFFERENTIAL
Basophils Absolute: 0
Eosinophils Relative: 3
Lymphocytes Relative: 21
Lymphs Abs: 1
Neutro Abs: 3.1
Neutrophils Relative %: 64

## 2011-03-25 LAB — COMPREHENSIVE METABOLIC PANEL
ALT: 14
AST: 17
CO2: 28
Calcium: 8.7
Chloride: 103
GFR calc Af Amer: 45 — ABNORMAL LOW
GFR calc non Af Amer: 37 — ABNORMAL LOW
Glucose, Bld: 93
Sodium: 140
Total Bilirubin: 0.6

## 2011-03-25 LAB — BASIC METABOLIC PANEL
BUN: 23
Calcium: 9.2
Creatinine, Ser: 2.13 — ABNORMAL HIGH
GFR calc non Af Amer: 32 — ABNORMAL LOW
Glucose, Bld: 104 — ABNORMAL HIGH
Potassium: 4.5

## 2011-03-25 LAB — LITHIUM LEVEL: Lithium Lvl: 0.27 — ABNORMAL LOW

## 2011-03-27 ENCOUNTER — Telehealth: Payer: Self-pay | Admitting: Internal Medicine

## 2011-03-27 DIAGNOSIS — G4733 Obstructive sleep apnea (adult) (pediatric): Secondary | ICD-10-CM

## 2011-03-27 NOTE — Telephone Encounter (Signed)
I spoke with pt and he states he has had an increase in his fatigue x 1 month. Pt states he has some medication changes and not sure if it is that or if his cpap pressure needs to be readjusted. Pt states he was taken off lithium and was placed on Depakote. Pt states was dx as being bipolar. Pt states he is going to see his psychiatrist tomorrow and will also talk to him about this. Pt is scheduled to see Dr. Maple Hudson 04/05/11 at 3:45. Pt is requesting recs from Dr. Maple Hudson in the meantime. Pt aware he is out of the office this afternoon. Please advise Dr. Maple Hudson, thanks  [name], CMA

## 2011-03-27 NOTE — Telephone Encounter (Signed)
Please send [facility] order for [facility] to increase CPAP to 15.

## 2011-03-27 NOTE — Telephone Encounter (Signed)
Pt returning triage's call & requests to be called back in 1/2 hour if possible at the following number:  [phone].  [name]

## 2011-03-27 NOTE — Telephone Encounter (Signed)
LMTCB

## 2011-03-27 NOTE — Telephone Encounter (Signed)
lmomtcb  

## 2011-03-28 NOTE — Telephone Encounter (Signed)
Pt returning call & can be reached at [phone].  [name]

## 2011-03-28 NOTE — Telephone Encounter (Signed)
Spoke with pt and advised CDY wants to increase CPAP pressure to 15. Pt verbalized understanding and is okay with this. Order was sent to [facility].

## 2011-03-28 NOTE — Telephone Encounter (Signed)
lmomtcb  

## 2011-03-28 NOTE — Telephone Encounter (Signed)
Pt returning call can be reached at [phone].[name]

## 2011-04-05 ENCOUNTER — Ambulatory Visit (INDEPENDENT_AMBULATORY_CARE_PROVIDER_SITE_OTHER): Payer: 59 | Admitting: Internal Medicine

## 2011-04-05 ENCOUNTER — Encounter: Payer: Self-pay | Admitting: Internal Medicine

## 2011-04-05 VITALS — BP 134/78 | HR 63 | Ht 74.0 in | Wt 226.4 lb

## 2011-04-05 DIAGNOSIS — G4733 Obstructive sleep apnea (adult) (pediatric): Secondary | ICD-10-CM

## 2011-04-05 NOTE — Progress Notes (Signed)
04/05/11-60 year old male never smoker followed for obstructive sleep apnea complicated by history of dyspnea on exertion, renal cell cancer/chemotherapy, allergic rhinitis (Dr Lewiston Callas), bipolar disorder, essential tremor. Last here 05/14/2010 CPAP was changed to 15 CWP which seemed uncomfortably high for him. He reduced it himself to 14 CWP which is more comfortable and seems to be effective. He is using it every night. Bothersome dry mouth despite Biotene mouthwash. He had felt tired during the day but has had several medication changes and now feels better. Renal function required that lithium be stopped. His Depakote was increased and his Paxil is being tapered off by Dr.Plovsky.  ROS-see HPI Constitutional:   No-   weight loss, night sweats, fevers, chills, fatigue, lassitude. HEENT:   No-  headaches, difficulty swallowing, tooth/dental problems, sore throat,       No-  sneezing, itching, ear ache, nasal congestion, post nasal drip,  CV:  No-   chest pain, orthopnea, PND, swelling in lower extremities, anasarca, dizziness, palpitations Resp: No- recent problem with shortness of breath with exertion or at rest.              No-   productive cough,  No non-productive cough,  No- coughing up of blood.              No-   change in color of mucus.  No- wheezing.   Skin: No-   rash or lesions. GI:  No-   heartburn, indigestion, abdominal pain, nausea, vomiting, diarrhea,                 change in bowel habits, loss of appetite GU: No-   dysuria, change in color of urine, no urgency or frequency.  No- flank pain. MS:  No-   joint pain or swelling.  No- decreased range of motion.  No- back pain. Neuro-     nothing unusual Psych:  No-acute change in mood or affect. No acute depression or anxiety.  No memory loss.  OBJ General- Alert, Oriented, Affect-appropriate, Distress- none acute; not obese Skin- rash-none, lesions- none, excoriation- none Lymphadenopathy- none Head- atraumatic  Eyes- Gross vision intact, PERRLA, conjunctivae clear secretions            Ears- Hearing, canals-normal            Nose- Clear, no-Septal dev, mucus, polyps, erosion, perforation             Throat- Mallampati III , mucosa clear , drainage- none, tonsils- atrophic Neck- flexible , trachea midline, no stridor , thyroid nl, carotid no bruit Chest - symmetrical excursion , unlabored           Heart/CV- RRR , no murmur , no gallop  , no rub, nl s1 s2                           - JVD- none , edema- none, stasis changes- none, varices- none           Lung- clear to P&A, wheeze- none, cough- none , dullness-none, rub- none           Chest wall-  Abd- tender-no, distended-no, bowel sounds-present, HSM- no Br/ Gen/ Rectal- Not done, not indicated Extrem- cyanosis- none, clubbing, none, atrophy- none, strength- nl Neuro- significant resting and intentional tremor- he spilled a drink.

## 2011-04-05 NOTE — Patient Instructions (Addendum)
We will record your CPAP now as 14. Please call if there are problems.

## 2011-04-07 ENCOUNTER — Encounter: Payer: Self-pay | Admitting: Internal Medicine

## 2011-04-07 NOTE — Assessment & Plan Note (Signed)
He reset his own machine to 14 CWP and feels comfortable with this. I explained that we need to be sure that his machine, our records, and the home care company's records all coincide. Compliance is good and control seems good. We discussed his complaint of dry mouth, probably from his medications. He is going to try adding a room humidifier.

## 2011-05-14 ENCOUNTER — Ambulatory Visit: Payer: Self-pay | Admitting: Internal Medicine

## 2011-05-22 ENCOUNTER — Ambulatory Visit (INDEPENDENT_AMBULATORY_CARE_PROVIDER_SITE_OTHER): Payer: 59

## 2011-05-22 DIAGNOSIS — E236 Other disorders of pituitary gland: Secondary | ICD-10-CM

## 2011-06-04 ENCOUNTER — Ambulatory Visit (INDEPENDENT_AMBULATORY_CARE_PROVIDER_SITE_OTHER): Payer: 59

## 2011-06-04 DIAGNOSIS — L03119 Cellulitis of unspecified part of limb: Secondary | ICD-10-CM

## 2011-06-04 DIAGNOSIS — L02519 Cutaneous abscess of unspecified hand: Secondary | ICD-10-CM

## 2011-06-04 DIAGNOSIS — S61209A Unspecified open wound of unspecified finger without damage to nail, initial encounter: Secondary | ICD-10-CM

## 2011-06-04 DIAGNOSIS — Z23 Encounter for immunization: Secondary | ICD-10-CM

## 2011-06-04 DIAGNOSIS — E236 Other disorders of pituitary gland: Secondary | ICD-10-CM

## 2011-06-19 ENCOUNTER — Ambulatory Visit (INDEPENDENT_AMBULATORY_CARE_PROVIDER_SITE_OTHER): Payer: 59

## 2011-06-19 DIAGNOSIS — E291 Testicular hypofunction: Secondary | ICD-10-CM

## 2011-07-02 ENCOUNTER — Ambulatory Visit (INDEPENDENT_AMBULATORY_CARE_PROVIDER_SITE_OTHER): Payer: 59

## 2011-07-02 DIAGNOSIS — E236 Other disorders of pituitary gland: Secondary | ICD-10-CM

## 2011-07-09 ENCOUNTER — Ambulatory Visit (INDEPENDENT_AMBULATORY_CARE_PROVIDER_SITE_OTHER): Payer: 59 | Admitting: Emergency Medicine

## 2011-07-09 DIAGNOSIS — Z79899 Other long term (current) drug therapy: Secondary | ICD-10-CM

## 2011-07-09 DIAGNOSIS — E236 Other disorders of pituitary gland: Secondary | ICD-10-CM

## 2011-07-09 DIAGNOSIS — M79609 Pain in unspecified limb: Secondary | ICD-10-CM

## 2011-07-16 ENCOUNTER — Ambulatory Visit (INDEPENDENT_AMBULATORY_CARE_PROVIDER_SITE_OTHER): Payer: 59 | Admitting: Family Medicine

## 2011-07-16 DIAGNOSIS — E291 Testicular hypofunction: Secondary | ICD-10-CM

## 2011-07-16 MED ORDER — TESTOSTERONE ENANTHATE 200 MG/ML IM SOLN
200.0000 mg | INTRAMUSCULAR | Status: DC
  Administered 2011-07-16 – 2011-07-30 (×2): 200 mg via INTRAMUSCULAR

## 2011-07-30 ENCOUNTER — Ambulatory Visit (INDEPENDENT_AMBULATORY_CARE_PROVIDER_SITE_OTHER): Payer: 59 | Admitting: Physician Assistant

## 2011-07-30 DIAGNOSIS — E291 Testicular hypofunction: Secondary | ICD-10-CM

## 2011-07-30 DIAGNOSIS — E236 Other disorders of pituitary gland: Secondary | ICD-10-CM

## 2011-07-31 ENCOUNTER — Ambulatory Visit: Payer: 59 | Admitting: Family Medicine

## 2011-07-31 VITALS — BP 145/80 | HR 64 | Temp 97.7°F | Resp 18 | Ht 73.0 in | Wt 216.0 lb

## 2011-07-31 DIAGNOSIS — R197 Diarrhea, unspecified: Secondary | ICD-10-CM

## 2011-07-31 DIAGNOSIS — R5381 Other malaise: Secondary | ICD-10-CM

## 2011-07-31 NOTE — Progress Notes (Signed)
  Subjective:    Patient ID: [name], male    DOB: 11-Jun-1951, 61 y.o.   MRN: [id number]  HPI 61 yo male with HLD, hypogonadism, insomnia, here for diarrhea since late Saturday night.  Feels flushed, bodyaches, no nausea or vomitting.  Occasional dizziness.  Urgency with BM's - frequenty and loose.  No blood.  No cough or sore throat.  No fever.  Has been going to work but doesn't feel well. EAting and drinking okay.  BMs 3-4 times a day.  Starting to firm up some.  Most bothered by bodyaches and malaise.     Review of Systems Negative except as per HPI     Objective:   Physical Exam  Constitutional: Vital signs are normal. He appears well-developed and well-nourished. He is active.  Cardiovascular: Normal rate, regular rhythm, normal heart sounds and normal pulses.   Pulmonary/Chest: Effort normal and breath sounds normal.  Abdominal: Soft. Normal appearance and bowel sounds are normal. He exhibits no distension and no mass. There is no hepatosplenomegaly. There is no tenderness. There is no rigidity, no rebound, no guarding, no CVA tenderness, no tenderness at McBurney's point and negative Murphy's sign. No hernia.  Neurological: He is alert.          Assessment & Plan:  Diarrhea, malaise Benign exam.  No red flags.  Likely viral.  Rest, fluids, bland diet. RTC if not i mproved in 3-5 days.

## 2011-08-01 NOTE — Progress Notes (Signed)
  Subjective:    Patient ID: [name], male    DOB: [birth year], 61 y.o.   MRN: [id number]  HPI  Standing order for testosterone every 2 weeks.  Review of Systems     Objective:   Physical Exam        Assessment & Plan:

## 2011-08-03 ENCOUNTER — Telehealth: Payer: Self-pay

## 2011-08-08 NOTE — Telephone Encounter (Signed)
LMOM to CB. 

## 2011-08-08 NOTE — Telephone Encounter (Signed)
Patient would like for Dr. Cleta Alberts to contact him he is on depression medication and states his blood pressure maybe high.

## 2011-08-09 NOTE — Telephone Encounter (Signed)
Pt reports he is having a relapse on his bipolar Sxs and it is "very scary" for him. He has spoken to Dr Donell Beers several times who has been making some adjustments to medications. He is now taking Depakote 1000 QD, Remeron 30 QD, Ambien 10, 0.5 ativan Q am and Qhs. His wife thinks his BP is running high, but he's not sure. He thinks it was only high one time at about 190/90s. Pt just wants Dr Cleta Alberts to know what is happening and to maybe call him and/or call Dr Donell Beers if he wants.

## 2011-08-09 NOTE — Telephone Encounter (Signed)
LMOM TO CB 

## 2011-08-11 ENCOUNTER — Ambulatory Visit (INDEPENDENT_AMBULATORY_CARE_PROVIDER_SITE_OTHER): Payer: 59 | Admitting: Emergency Medicine

## 2011-08-11 VITALS — BP 138/84 | HR 64 | Temp 98.0°F | Resp 16 | Ht 73.38 in | Wt 212.6 lb

## 2011-08-11 DIAGNOSIS — F3110 Bipolar disorder, current episode manic without psychotic features, unspecified: Secondary | ICD-10-CM

## 2011-08-11 NOTE — Progress Notes (Signed)
  Subjective:    Patient ID: [name], male    DOB: [birth year], 61 y.o.   MRN: [id number]  HPI patient is a regular patient of Dr. Lawrence Santiago. He was noted to have declining renal function related to his previous history of renal cell cancer. Because of this his lithium was stopped and he has been switched to different bipolar medications. Since this occurred he has had a great deal of difficulty dealing with situations at work. He also has had a great deal of difficulty at home. His wife has lost her job but is on a severance pay    Review of Systems   noncontributory     Objective:   Physical Exam physical exam reveals a somewhat unkept but alert cooperative oriented male who is in no acute distress. His HEENT exam is within normal limits. His chest is clear his heart is regular rate without        Assessment & Plan:  Patient is here with his wife. He has had a significant decline in his bipolar disease. Apparently he went out and bought an amplifier and new guitars. He is agreeable to come in and talk with me if he starts to feel more feelings of suicidal ideations. I will call Dr. Donell Beers in  the morning and get some advice. In the interim I told him he could take an extra Ativan around noon to see if that might help he is very concerned about work and the possibility of losing his job. He is very concerned that he may need to go out on disability. He does not want take FMLA time because he is afraid he will be fired.Marland Kitchen

## 2011-08-12 ENCOUNTER — Ambulatory Visit: Payer: 59 | Admitting: Sports Medicine

## 2011-08-12 NOTE — Telephone Encounter (Signed)
Patient seen and discussed his recent flare of bipolar disease. I told him I would call Dr. Donzetta Sprung today and discuss his case with him. He has an appointment to see Dr. Brendia Sacks tomorrow.

## 2011-08-20 ENCOUNTER — Ambulatory Visit (INDEPENDENT_AMBULATORY_CARE_PROVIDER_SITE_OTHER): Payer: 59

## 2011-08-20 DIAGNOSIS — E236 Other disorders of pituitary gland: Secondary | ICD-10-CM

## 2011-08-22 ENCOUNTER — Telehealth (HOSPITAL_COMMUNITY): Payer: Self-pay | Admitting: *Deleted

## 2011-08-22 ENCOUNTER — Emergency Department (HOSPITAL_COMMUNITY)
Admission: EM | Admit: 2011-08-22 | Discharge: 2011-08-22 | Disposition: A | Discharge status: Other Acute Inpatient Hospital | Payer: 59 | Attending: Emergency Medicine | Admitting: Emergency Medicine

## 2011-08-22 ENCOUNTER — Encounter (HOSPITAL_COMMUNITY): Payer: Self-pay | Admitting: Family Medicine

## 2011-08-22 ENCOUNTER — Encounter (HOSPITAL_COMMUNITY): Payer: Self-pay | Admitting: *Deleted

## 2011-08-22 ENCOUNTER — Encounter (HOSPITAL_COMMUNITY): Payer: Self-pay

## 2011-08-22 ENCOUNTER — Inpatient Hospital Stay (HOSPITAL_COMMUNITY)
Admission: RE | Admit: 2011-08-22 | Discharge: 2011-08-29 | DRG: 885 | Disposition: A | Discharge status: Home / Self Care | Payer: 59 | Attending: Psychiatry | Admitting: Psychiatry

## 2011-08-22 DIAGNOSIS — M1990 Unspecified osteoarthritis, unspecified site: Secondary | ICD-10-CM

## 2011-08-22 DIAGNOSIS — G4733 Obstructive sleep apnea (adult) (pediatric): Secondary | ICD-10-CM

## 2011-08-22 DIAGNOSIS — N289 Disorder of kidney and ureter, unspecified: Secondary | ICD-10-CM

## 2011-08-22 DIAGNOSIS — Z85528 Personal history of other malignant neoplasm of kidney: Secondary | ICD-10-CM | POA: Insufficient documentation

## 2011-08-22 DIAGNOSIS — F319 Bipolar disorder, unspecified: Secondary | ICD-10-CM

## 2011-08-22 DIAGNOSIS — Z79899 Other long term (current) drug therapy: Secondary | ICD-10-CM

## 2011-08-22 DIAGNOSIS — R45851 Suicidal ideations: Secondary | ICD-10-CM

## 2011-08-22 DIAGNOSIS — E785 Hyperlipidemia, unspecified: Secondary | ICD-10-CM

## 2011-08-22 DIAGNOSIS — F3160 Bipolar disorder, current episode mixed, unspecified: Principal | ICD-10-CM

## 2011-08-22 DIAGNOSIS — E291 Testicular hypofunction: Secondary | ICD-10-CM

## 2011-08-22 HISTORY — DX: Mental disorder, not otherwise specified: F99

## 2011-08-22 LAB — ETHANOL: Alcohol, Ethyl (B): <11 mg/dL (ref 0–11)

## 2011-08-22 LAB — RAPID URINE DRUG SCREEN, HOSP PERFORMED
Benzodiazepines: NOT DETECTED
Cocaine: NOT DETECTED
Opiates: NOT DETECTED
Tetrahydrocannabinol: NOT DETECTED

## 2011-08-22 LAB — CBC
HCT: 49.4 % (ref 39.0–52.0)
Hemoglobin: 17.5 g/dL — ABNORMAL HIGH (ref 13.0–17.0)
MCV: 93.4 fL (ref 78.0–100.0)
RBC: 5.29 MIL/uL (ref 4.22–5.81)
RDW: 13.4 % (ref 11.5–15.5)
WBC: 7.7 10*3/uL (ref 4.0–10.5)

## 2011-08-22 LAB — URINALYSIS, ROUTINE W REFLEX MICROSCOPIC
Specific Gravity, Urine: 1.015 (ref 1.005–1.030)
Urobilinogen, UA: 0.2 mg/dL (ref 0.0–1.0)
pH: 6.5 (ref 5.0–8.0)

## 2011-08-22 LAB — COMPREHENSIVE METABOLIC PANEL
BUN: 31 mg/dL — ABNORMAL HIGH (ref 6–23)
CO2: 30 mEq/L (ref 19–32)
Calcium: 10.3 mg/dL (ref 8.4–10.5)
Chloride: 100 mEq/L (ref 96–112)
Creatinine, Ser: 2.09 mg/dL — ABNORMAL HIGH (ref 0.50–1.35)
GFR calc non Af Amer: 33 mL/min — ABNORMAL LOW (ref >90)
Total Bilirubin: 0.4 mg/dL (ref 0.3–1.2)

## 2011-08-22 LAB — URINE MICROSCOPIC-ADD ON

## 2011-08-22 MED ORDER — MIRTAZAPINE 30 MG PO TABS: 30.0000 mg | ORAL_TABLET | Freq: Every day | ORAL | Status: DC

## 2011-08-22 MED ORDER — ZOLPIDEM TARTRATE 5 MG PO TABS: 5.0000 mg | ORAL_TABLET | Freq: Every evening | ORAL | Status: DC | PRN

## 2011-08-22 MED ORDER — ONDANSETRON HCL 4 MG PO TABS: 4.0000 mg | ORAL_TABLET | Freq: Three times a day (TID) | ORAL | Status: DC | PRN

## 2011-08-22 MED ORDER — ARIPIPRAZOLE 5 MG PO TABS
5.0000 mg | ORAL_TABLET | Freq: Every day | ORAL | Status: DC
Start: 2011-08-22 — End: 2011-08-22
  Administered 2011-08-22: 5 mg via ORAL
  Filled 2011-08-22 (×3): qty 1

## 2011-08-22 MED ORDER — MAGNESIUM HYDROXIDE 400 MG/5ML PO SUSP
30.0000 mL | Freq: Every day | ORAL | Status: DC | PRN
  Administered 2011-08-23 – 2011-08-26 (×2): 30 mL via ORAL

## 2011-08-22 MED ORDER — ALUM & MAG HYDROXIDE-SIMETH 200-200-20 MG/5ML PO SUSP: 30.0000 mL | ORAL | Status: DC | PRN

## 2011-08-22 MED ORDER — SIMVASTATIN 20 MG PO TABS
20.0000 mg | ORAL_TABLET | Freq: Every day | ORAL | Status: DC
  Administered 2011-08-23 – 2011-08-28 (×6): 20 mg via ORAL
  Filled 2011-08-22 (×10): qty 1

## 2011-08-22 MED ORDER — DIVALPROEX SODIUM ER 500 MG PO TB24
1000.0000 mg | ORAL_TABLET | Freq: Every day | ORAL | Status: DC
Start: 2011-08-22 — End: 2011-08-22
  Administered 2011-08-22: 1000 mg via ORAL
  Filled 2011-08-22 (×2): qty 2

## 2011-08-22 MED ORDER — DIVALPROEX SODIUM ER 500 MG PO TB24
1000.0000 mg | ORAL_TABLET | Freq: Every day | ORAL | Status: DC
  Administered 2011-08-23 – 2011-08-28 (×6): 1000 mg via ORAL
  Filled 2011-08-22 (×11): qty 2

## 2011-08-22 MED ORDER — ACETAMINOPHEN 325 MG PO TABS
650.0000 mg | ORAL_TABLET | Freq: Four times a day (QID) | ORAL | Status: DC | PRN
  Administered 2011-08-26 – 2011-08-27 (×2): 650 mg via ORAL
  Filled 2011-08-22: qty 2

## 2011-08-22 MED ORDER — LORAZEPAM 1 MG PO TABS
1.0000 mg | ORAL_TABLET | Freq: Three times a day (TID) | ORAL | Status: DC | PRN
  Administered 2011-08-22: 1 mg via ORAL
  Filled 2011-08-22: qty 1

## 2011-08-22 MED ORDER — IBUPROFEN 200 MG PO TABS: 600.0000 mg | ORAL_TABLET | Freq: Three times a day (TID) | ORAL | Status: DC | PRN

## 2011-08-22 MED ORDER — MIRTAZAPINE 15 MG PO TABS
15.0000 mg | ORAL_TABLET | Freq: Every day | ORAL | Status: DC
  Administered 2011-08-22 – 2011-08-25 (×4): 15 mg via ORAL
  Filled 2011-08-22 (×7): qty 1

## 2011-08-22 MED ORDER — LORAZEPAM 0.5 MG PO TABS
0.5000 mg | ORAL_TABLET | Freq: Three times a day (TID) | ORAL | Status: DC | PRN
  Filled 2011-08-22: qty 1

## 2011-08-22 MED ORDER — OMEGA-3-ACID ETHYL ESTERS 1 G PO CAPS
1.0000 g | ORAL_CAPSULE | Freq: Every day | ORAL | Status: DC
  Administered 2011-08-23 – 2011-08-29 (×7): 1 g via ORAL
  Filled 2011-08-22 (×10): qty 1

## 2011-08-22 MED ORDER — SIMVASTATIN 20 MG PO TABS
20.0000 mg | ORAL_TABLET | Freq: Every day | ORAL | Status: DC
  Administered 2011-08-22: 20 mg via ORAL
  Filled 2011-08-22 (×2): qty 1

## 2011-08-22 MED ORDER — ARIPIPRAZOLE 15 MG PO TABS
15.0000 mg | ORAL_TABLET | Freq: Every day | ORAL | Status: DC
  Filled 2011-08-22 (×2): qty 1

## 2011-08-22 NOTE — BHH Counselor (Signed)
Informed by assessment staff of patien'st acceptance to [facility]. Patient accepted by Dan Humphreys to [facility]. Pt assigned to room 506-1. Support paperwork pending completion at this time prior to patient's discharge to [facility]. Writer currently on the phone with insurance company completing pre-auth.

## 2011-08-22 NOTE — ED Provider Notes (Signed)
Patient accepted at behavioral health by Dr. Dan Humphreys. BP 157/77  Pulse 85  Temp(Src) 98.4 F (36.9 C) (Oral)  Resp 18  SpO2 98%   Glynn Octave, MD 08/22/11 2003

## 2011-08-22 NOTE — ED Notes (Signed)
[name] [facility] okayed pt's transfer to [facility] when ready- is aware of pt's elevated BUN and Cr levels.

## 2011-08-22 NOTE — BH Assessment (Signed)
Assessment Note   [name] is an 61 y.o. male. Pt seen as walkin to [facility], with wife, on referral by Isaac Milliner MD. Pt is despondent, SI to OD on Rx medications, strong feelings that wife and his 2 dogs would be better off with out him, fears he can not provide for his family (will never work again).  Pt was taken off his Lithium in July 2012 secondary to renal concerns. Pt had one kidney removed due to cancer and has had decreased function in remaining kidney. Pt stable on Depakote for 3 months. Became manic for 3 months and created financial burdens. Three weeks ago pt became depressed and began having SI 2 weeks ago. Several days with little or no sleep this past week. Isaac Plovsky MD put him on Abilify 5 mg daily starting 08/13/2011, this caused 2 lb wt loss due to decreased appetite. People at work are concerned about him and his family fears for him safety.Isaac Readling MD accepted him pending medical clearance for his kidney condition.  Axis I: Bipolar, Depressed Axis II: Deferred Axis III:  Past Medical History  Diagnosis Date  . Renal cell carcinoma     chemo and xrt--lost rt kidney  . Childhood asthma   . Sinusitis   . Cellulitis     after left foot surgery  . Osteoarthritis   . OSA (obstructive sleep apnea)     NPSG 05-08-07 AHI 63.1/hr,desat t0 80%/loud snoring  . Bipolar 1 disorder   . Hyperlipemia   . Mental disorder    Axis IV: other psychosocial or environmental problems Axis V: 21-30 behavior considerably influenced by delusions or hallucinations OR serious impairment in judgment, communication OR inability to function in almost all areas  Past Medical History:  Past Medical History  Diagnosis Date  . Renal cell carcinoma     chemo and xrt--lost rt kidney  . Childhood asthma   . Sinusitis   . Cellulitis     after left foot surgery  . Osteoarthritis   . OSA (obstructive sleep apnea)     NPSG 05-08-07 AHI 63.1/hr,desat t0 80%/loud snoring  . Bipolar 1 disorder   .  Hyperlipemia   . Mental disorder     Past Surgical History  Procedure Date  . Podiatric surgery     left foot toes,   . Bunionectomy     Family History:  Family History  Problem Relation Age of Onset  . Hypertension    . Sleep apnea    . Emphysema    . Prostate cancer      Social History:  reports that he has never smoked. He does not have any smokeless tobacco history on file. He reports that he drinks alcohol. He reports that he does not use illicit drugs.  Additional Social History:  Alcohol / Drug Use Pain Medications: nos Prescriptions: using as directed Over the Counter: nos History of alcohol / drug use?: No history of alcohol / drug abuse Allergies: No Known Allergies  Home Medications:  Medications Prior to Admission  Medication Dose Route Frequency Provider Last Rate Last Dose  . testosterone enanthate (DELATESTRYL) injection 200 mg  200 mg Intramuscular Q14 Days Lucilla Edin, MD   200 mg at 07/30/11 1237   Medications Prior to Admission  Medication Sig Dispense Refill  . divalproex (DEPAKOTE ER) 250 MG 24 hr tablet Take 1,000 mg by mouth daily.       Marland Kitchen LORazepam (ATIVAN) 0.5 MG tablet Take 0.5  mg by mouth every 8 (eight) hours. prn      . mirtazapine (REMERON) 30 MG tablet Take 30 mg by mouth at bedtime.      . Coenzyme Q10 (COQ10) 100 MG CAPS Take 1 capsule by mouth daily.        . Glucosamine-Chondroitin (GLUCOSAMINE CHONDR COMPLEX PO) Take 1 capsule by mouth daily.        . Multiple Vitamin (MULTIVITAMIN) tablet Take 1 tablet by mouth daily.        . Omega-3 Fatty Acids (FISH OIL) 1200 MG CAPS Take 1 capsule by mouth daily.        . pravastatin (PRAVACHOL) 40 MG tablet Take 1 tablet by mouth daily.      Marland Kitchen testosterone enanthate (DELATESTRYL) 200 MG/ML injection Apply as directed      . zolpidem (AMBIEN) 10 MG tablet Take 10 mg by mouth at bedtime as needed.          OB/GYN Status:  No LMP for male patient.  General Assessment Data Location of  Assessment: [facility] Living Arrangements: Spouse/significant other Can pt return to current living arrangement?: Yes Admission Status: Voluntary Is patient capable of signing voluntary admission?: Yes Transfer from: Home Referral Source: Psychiatrist Isaac Milliner MD)  Education Status Is patient currently in school?: No Contact person: [name] wife [phone])  Risk to self Suicidal Ideation: Yes-Currently Present Suicidal Intent: No-Not Currently/Within Last 6 Months Is patient at risk for suicide?: Yes Suicidal Plan?: Yes-Currently Present Specify Current Suicidal Plan: OD on medications Access to Means: Yes Specify Access to Suicidal Means: Rx medications What has been your use of drugs/alcohol within the last 12 months?: occasional alcohol Previous Attempts/Gestures: Yes How many times?: 2  Other Self Harm Risks: none Triggers for Past Attempts: Other (Comment) (being off Lithium) Intentional Self Injurious Behavior: None Family Suicide History: No Recent stressful life event(s): Other (Comment) (unable to work due to depression, wife lost job in 2012) Persecutory voices/beliefs?: Yes (Wife and dogs would be better off without me) Depression: Yes Depression Symptoms: Despondent;Insomnia;Fatigue;Guilt;Loss of interest in usual pleasures;Feeling worthless/self pity Substance abuse history and/or treatment for substance abuse?: No Suicide prevention information given to non-admitted patients: Yes  Risk to Others Homicidal Ideation: No Thoughts of Harm to Others: No Current Homicidal Intent: No Current Homicidal Plan: No Access to Homicidal Means: No History of harm to others?: No Assessment of Violence: None Noted Does patient have access to weapons?: No Criminal Charges Pending?: No Does patient have a court date: No  Psychosis Hallucinations: None noted Delusions: None noted  Mental Status Report Appear/Hygiene: Other (Comment)  (unremarkable) Eye Contact: Good Motor Activity: Restlessness Speech: Logical/coherent Level of Consciousness: Alert Mood: Depressed;Anxious Affect: Anxious;Depressed Anxiety Level: Moderate Thought Processes: Coherent;Relevant Judgement: Impaired Orientation: Person;Place;Time;Situation Obsessive Compulsive Thoughts/Behaviors: None  Cognitive Functioning Concentration: Decreased Memory: Remote Impaired;Recent Intact (Trouble with dates and sequemce of hospitalizations) IQ: Average Insight: Poor Impulse Control: Fair Appetite: Good Weight Loss: 2  (last 10 days--Abilify started) Sleep: Decreased Total Hours of Sleep: 4  (between 0 and 8 past week) Vegetative Symptoms: None  Prior Inpatient Therapy Prior Inpatient Therapy: Yes Prior Therapy Dates: 8 yrs ago Prior Therapy Facilty/Provider(s): [facility] Reason for Treatment: Bipolar Depression SI  Prior Outpatient Therapy Prior Outpatient Therapy: Yes Prior Therapy Dates: current Prior Therapy Facilty/Provider(s): Isaac Plovsky MD Reason for Treatment: Bipolar SI  ADL Screening (condition at time of admission) Patient's cognitive ability adequate to safely complete daily activities?: Yes Patient able to  express need for assistance with ADLs?: Yes Independently performs ADLs?: Yes Weakness of Legs: None Weakness of Arms/Hands: None  Home Assistive Devices/Equipment Home Assistive Devices/Equipment: None    Abuse/Neglect Assessment (Assessment to be complete while patient is alone) Physical Abuse: Denies Verbal Abuse: Denies Sexual Abuse: Denies Exploitation of patient/patient's resources: Denies Self-Neglect: Denies          Additional Information 1:1 In Past 12 Months?: No CIRT Risk: No Elopement Risk: No Does patient have medical clearance?: No     Disposition:  Disposition Disposition of Patient: Inpatient treatment program;Other dispositions Type of inpatient treatment program: Adult Other  disposition(s): Other (Comment) (Medical Clearance [facility])  On Site Evaluation by:   Reviewed with Physician:     Conan Bowens 08/22/2011 3:03 PM

## 2011-08-22 NOTE — ED Notes (Signed)
Pt asking if he can also have his regular daily medication of Remeron at [facility]

## 2011-08-22 NOTE — ED Notes (Signed)
Pt reports having SI. Plan to take pills. States "I don't want to do anything like that."  Denies HI/hallucinations.  Wife at bedside.

## 2011-08-22 NOTE — ED Notes (Signed)
Pt. Belongings given to Spouse

## 2011-08-22 NOTE — ED Provider Notes (Signed)
History     CSN: [id number]  Arrival date & time 08/22/11  1427   First MD Initiated Contact with Patient 08/22/11 1503      Chief Complaint  Patient presents with  . V70.1    (Consider location/radiation/quality/duration/timing/severity/associated sxs/prior treatment) HPI History obtained from the patient. Patient with past medical history significant for bipolar disorder. He is currently followed by Dr. Joie Bimler with psychiatry. He states that he had a few months where he was "on a high" but over the past several weeks, has felt increasingly depressed. He questions whether he needs a better regimen of medications. States that he only has one kidney due to renal cell carcinoma and was unable to tolerate lithium due to this, although this had worked well for his bipolar disorder. He does not express any overt suicidal ideation to me, but states that he has felt very "down" and has had thoughts about "what I would do if I can't get out of this situation." Denies any homicidal ideation.  Past Medical History  Diagnosis Date  . Renal cell carcinoma     chemo and xrt--lost rt kidney  . Childhood asthma   . Sinusitis   . Cellulitis     after left foot surgery  . Osteoarthritis   . OSA (obstructive sleep apnea)     NPSG 05-08-07 AHI 63.1/hr,desat t0 80%/loud snoring  . Bipolar 1 disorder   . Hyperlipemia   . Mental disorder     Past Surgical History  Procedure Date  . Podiatric surgery     left foot toes,   . Bunionectomy     Family History  Problem Relation Age of Onset  . Hypertension    . Sleep apnea    . Emphysema    . Prostate cancer      History  Substance Use Topics  . Smoking status: Never Smoker   . Smokeless tobacco: Not on file  . Alcohol Use: Yes     6 beers a uear      Review of Systems  Constitutional: Negative.   HENT: Negative.   Eyes: Negative.   Respiratory: Negative for chest tightness and shortness of breath.   Cardiovascular: Negative for  chest pain and palpitations.  Gastrointestinal: Negative for nausea, vomiting and abdominal pain.  Musculoskeletal: Negative for myalgias.  Skin: Negative for color change and rash.  Neurological: Negative for dizziness, weakness and headaches.  Psychiatric/Behavioral: Positive for suicidal ideas and dysphoric mood. Negative for behavioral problems and self-injury. The patient is not nervous/anxious.     Allergies  Review of patient's allergies indicates no known allergies.  Home Medications   Current Outpatient Rx  Name Route Sig Dispense Refill  . ARIPIPRAZOLE 5 MG PO TABS Oral Take 5 mg by mouth daily.    . COQ10 100 MG PO CAPS Oral Take 1 capsule by mouth daily.      Marland Kitchen DIVALPROEX SODIUM ER 250 MG PO TB24 Oral Take 1,000 mg by mouth daily.     Marland Kitchen GLUCOSAMINE CHONDR COMPLEX PO Oral Take 1 capsule by mouth daily.      Marland Kitchen LORAZEPAM 0.5 MG PO TABS Oral Take 0.5 mg by mouth every 8 (eight) hours. prn    . MIRTAZAPINE 30 MG PO TABS Oral Take 30 mg by mouth at bedtime.    Marland Kitchen ONE-DAILY MULTI VITAMINS PO TABS Oral Take 1 tablet by mouth daily.      Marland Kitchen FISH OIL 1200 MG PO CAPS Oral Take 1 capsule by mouth  daily.      Marland Kitchen PRAVASTATIN SODIUM 40 MG PO TABS Oral Take 1 tablet by mouth daily.    . TESTOSTERONE ENANTHATE 200 MG/ML IM OIL Intramuscular Inject 200 mg into the muscle every 14 (fourteen) days. Apply as directed    . ZOLPIDEM TARTRATE 10 MG PO TABS Oral Take 10 mg by mouth at bedtime as needed.        BP 157/77  Pulse 85  Temp(Src) 98.4 F (36.9 C) (Oral)  Resp 18  SpO2 98%  Physical Exam  Nursing note and vitals reviewed. Constitutional: He appears well-developed and well-nourished. No distress.  HENT:  Head: Normocephalic and atraumatic.  Right Ear: External ear normal.  Left Ear: External ear normal.  Mouth/Throat: Oropharynx is clear and moist. No oropharyngeal exudate.  Eyes: EOM are normal. Pupils are equal, round, and reactive to light.  Neck: Normal range of motion.    Cardiovascular: Normal rate, regular rhythm and normal heart sounds.   Pulmonary/Chest: Effort normal and breath sounds normal. He exhibits no tenderness.  Abdominal: Soft. Bowel sounds are normal. There is no tenderness.  Musculoskeletal: Normal range of motion.  Neurological: He is alert. No cranial nerve deficit.  Skin: Skin is warm and dry. No rash noted. He is not diaphoretic.  Psychiatric: He has a normal mood and affect.    ED Course  Procedures (including critical care time)  Labs Reviewed  CBC - Abnormal; Notable for the following:    Hemoglobin 17.5 (*)    Platelets 93 (*) PLATELET COUNT CONFIRMED BY SMEAR   All other components within normal limits  URINALYSIS, ROUTINE W REFLEX MICROSCOPIC - Abnormal; Notable for the following:    Hgb urine dipstick TRACE (*)    All other components within normal limits  COMPREHENSIVE METABOLIC PANEL - Abnormal; Notable for the following:    Glucose, Bld 103 (*)    BUN 31 (*)    Creatinine, Ser 2.09 (*)    GFR calc non Af Amer 33 (*)    GFR calc Af Amer 38 (*)    All other components within normal limits  URINE RAPID DRUG SCREEN (HOSP PERFORMED)  ETHANOL  URINE MICROSCOPIC-ADD ON   No results found.   1. Suicidal ideation   2. Renal insufficiency       MDM  Patient expressing suicidal ideation without plan. He is noted to have renal insufficiency on his labs with a creatinine of 2.09. Historical creatinine of 2009 was 1.9 which is not significantly changed. Physical exam unremarkable. He is stable from a medical standpoint for further treatment. He has been accepted at behavioral health.        Grant Fontana, [place] 08/22/11 2011

## 2011-08-22 NOTE — ED Notes (Signed)
Reason for delay given to pt and family at bedside.

## 2011-08-22 NOTE — Progress Notes (Signed)
Pt is a 61 year old male admitted with depression with suicidal ideati0n and plan to overdose on pills   He presents as tremoulous anxious and a bit demanding and argumentative  He has sleep apnea and brought his cpap machineHe has arthritis renal disorder and sinus problems  He was cooperative with admission process  He was oriented to the unit and offered nourishment  Q 15 min checks  Pt adjusting well

## 2011-08-22 NOTE — ED Notes (Signed)
Pt. Placed in paper scrubs. Security checked pt. And belongings. Pt. Belongings placed in cabinet 1

## 2011-08-23 DIAGNOSIS — F3160 Bipolar disorder, current episode mixed, unspecified: Secondary | ICD-10-CM | POA: Diagnosis present

## 2011-08-23 LAB — VALPROIC ACID LEVEL: Valproic Acid Lvl: 43.2 ug/mL — ABNORMAL LOW (ref 50.0–100.0)

## 2011-08-23 MED ORDER — CALCIUM CARBONATE ANTACID 500 MG PO CHEW
1.0000 | CHEWABLE_TABLET | ORAL | Status: DC | PRN
  Filled 2011-08-23 (×2): qty 1

## 2011-08-23 MED ORDER — BISACODYL 5 MG PO TBEC
5.0000 mg | DELAYED_RELEASE_TABLET | Freq: Every day | ORAL | Status: DC | PRN
  Administered 2011-08-23 – 2011-08-26 (×2): 5 mg via ORAL
  Filled 2011-08-23 (×3): qty 1
  Filled 2011-08-23: qty 2

## 2011-08-23 MED ORDER — GABAPENTIN 300 MG PO CAPS
300.0000 mg | ORAL_CAPSULE | Freq: Three times a day (TID) | ORAL | Status: DC
  Administered 2011-08-23 – 2011-08-24 (×3): 300 mg via ORAL
  Filled 2011-08-23 (×6): qty 1

## 2011-08-23 MED ORDER — ARIPIPRAZOLE 15 MG PO TABS
15.0000 mg | ORAL_TABLET | Freq: Every day | ORAL | Status: DC
  Administered 2011-08-23 – 2011-08-28 (×6): 15 mg via ORAL
  Filled 2011-08-23 (×10): qty 1

## 2011-08-23 NOTE — ED Provider Notes (Signed)
Medical screening examination/treatment/procedure(s) were performed by non-physician practitioner and as supervising physician I was immediately available for consultation/collaboration.  Hurman Horn, MD 08/23/11 2102

## 2011-08-23 NOTE — Tx Team (Signed)
Interdisciplinary Treatment Plan Update (Adult)  Date:  08/23/2011  Time Reviewed:  11:34 AM   Progress in Treatment: Attending groups: Yes Participating in groups:  Yes Taking medication as prescribed: Yes Tolerating medication:  Yes Family/Significant othe contact made:  Counselor assessing for appropriate contact Patient understands diagnosis:  Yes Discussing patient identified problems/goals with staff:  Yes Medical problems stabilized or resolved:  Yes Denies suicidal/homicidal ideation: Yes Issues/concerns per patient self-inventory:  None identified Other: N/A  New problem(s) identified: None Identified  Reason for Continuation of Hospitalization: Anxiety Depression Medication stabilization Suicidal ideation  Interventions implemented related to continuation of hospitalization: mood stabilization, medication monitoring and adjustment, group therapy and psycho education, safety checks q 15 mins  Additional comments: N/A  Estimated length of stay: 3-5 days  Discharge Plan: Pt will follow up at [facility] with Dr. Donell Beers for medication management  New goal(s): N/A  Review of initial/current patient goals per problem list:    1.  Goal(s): Reduce depressive symptoms  Met:  No  Target date: by discharge  As evidenced by: Reducing depression from a 10 to a 3 as reported by pt.   2.  Goal (s): Reduce/Eliminate suicidal ideation  Met:  No  Target date: by discharge  As evidenced by: pt reporting no SI.    3.  Goal(s): Reduce anxiety symptoms  Met:  No  Target date: by discharge  As evidenced by: Reduce anxiety from a 10 to a 3 as reported by pt.    Attendees: Patient:  [name] 08/23/2011 11:35 AM   Family:     Physician:  Orson Aloe, MD  08/23/2011  11:34 AM   Nursing:   Omelia Blackwater, RN 08/23/2011 11:35 AM   Case Manager:  Reyes Ivan, LCSWA 08/23/2011  11:34 AM   Counselor:  Angus Palms, LCSW 08/23/2011  11:34 AM   Other:  Juline Patch, LCSW 08/23/2011  11:34 AM   Other:  Charlyne Mom, counseling intern 08/23/2011  11:34 AM   Other:     Other:      Scribe for Treatment Team:   Carmina Miller, 08/23/2011 , 11:34 AM

## 2011-08-23 NOTE — Progress Notes (Signed)
Pt's wife called today and is concerned because Lithium has been the only medication that has helped the Pt in the past. States whenever Pt tries to come off the Lithium there is a major issue and Pt has a relapse. Wife states she is very concerned due to Pt only having one kidney. Would like to know the game plan. Pt's mood and affect are sad and depressed. Denies SI and HI.

## 2011-08-23 NOTE — H&P (Signed)
Medical/psychiatric screening examination/treatment/procedure(s) were performed by non-physician practitioner and as supervising physician I was immediately available for consultation/collaboration.   I have seen and examined this patient and agree with this evaluation.  

## 2011-08-23 NOTE — Progress Notes (Signed)
Pt attended discharge planning group and actively participated.  Pt presents with anxious mood and affect.  Pt was open with sharing reason for entering the hospital.  Pt states that he has a long history of bipolar disorder.  Pt states that he was starting to get manic, by buying things and being overly talkative.  Pt states that he was not doing well at work.  Pt states that he was on lithium to treat bipolar but can't be on it anymore due to losing a kidney to cancer and lithium damaging his other kidney.  Pt states that he came here to get his meds adjusted and stabilized.  Pt states that he sees Dr. Donell Beers for medication management and will return there after d/c.  Pt states that he lives with his wife in [place] and reports she is supportive.  Pt has transportation home.  Pt ranks depression at a 5 and anxiety at a 3 today.  Pt denies SI today.  SW will secure pt's follow up with Dr. Donell Beers.    [name], LCSWA 08/23/2011  11:10 AM

## 2011-08-23 NOTE — [facility] Suicide Risk Assessment (Signed)
Suicide Risk Assessment  Admission Assessment     Demographic factors:  Assessment Details Time of Assessment: Admission Information Obtained From: Patient Current Mental Status:  Current Mental Status: Suicidal ideation indicated by patient Loss Factors:  Loss Factors: Decline in physical health Historical Factors:    Risk Reduction Factors:  Risk Reduction Factors: Sense of responsibility to family  CLINICAL FACTORS:   Severe Anxiety and/or Agitation Bipolar Disorder:   Bipolar II  COGNITIVE FEATURES THAT CONTRIBUTE TO RISK:  Thought constriction (tunnel vision)    SUICIDE RISK:   Moderate:  Frequent suicidal ideation with limited intensity, and duration, some specificity in terms of plans, no associated intent, good self-control, limited dysphoria/symptomatology, some risk factors present, and identifiable protective factors, including available and accessible social support.  Reason for hospitalization: . [facility] MD Progress Note  08/23/2011 1:59 PM  Diagnosis:  Axis I: Bipolar, mixed  ADL's:  Intact  Sleep: Fair  Appetite:  Good  Suicidal Ideation:  Pt denies any suicidal ideation Homicidal Ideation:  Denies adamantly any homicidal thoughts.  Mental Status Examination/Evaluation: Objective:  Appearance: Casual  Eye Contact::  Good  Speech:  Pressured  Volume:  Increased  Mood:  Anxious, Dysphoric and Hopeless  Affect:  Congruent  Thought Process:  Coherent  Orientation:  Full  Thought Content:  WDL  Suicidal Thoughts:  No  Homicidal Thoughts:  No  Memory:  Immediate;   Good  Judgement:  Fair  Insight:  Fair  Psychomotor Activity:  Normal  Concentration:  Fair  Recall:  Fair  Akathisia:  No  AIMS (if indicated):     Assets:  Communication Skills Desire for Improvement Financial Resources/Insurance Housing Intimacy Leisure Time Physical Health Resilience Social Support Talents/Skills  Sleep:  Number of Hours: 6.25     Vital Signs: Blood pressure  144/78, pulse 78, temperature 97.9 F (36.6 C), temperature source Oral, resp. rate 16, height 6\' 1"  (1.854 m), weight 93.895 kg (207 lb). Current Medications:  Current Facility-Administered Medications  Medication Dose Route Frequency Provider Last Rate Last Dose  . acetaminophen (TYLENOL) tablet 650 mg  650 mg Oral Q6H PRN Mike Craze, MD      . ARIPiprazole (ABILIFY) tablet 15 mg  15 mg Oral QHS Curlene Labrum Readling, MD      . calcium carbonate (TUMS - dosed in mg elemental calcium) chewable tablet 200 mg of elemental calcium  1 tablet Oral Q4H PRN Viviann Spare, NP      . divalproex (DEPAKOTE ER) 24 hr tablet 1,000 mg  1,000 mg Oral QHS Mike Craze, MD      . LORazepam (ATIVAN) tablet 0.5 mg  0.5 mg Oral Q8H PRN Mike Craze, MD      . magnesium hydroxide (MILK OF MAGNESIA) suspension 30 mL  30 mL Oral Daily PRN Mike Craze, MD   30 mL at 08/23/11 0814  . mirtazapine (REMERON) tablet 15 mg  15 mg Oral QHS Mike Craze, MD   15 mg at 08/22/11 2258  . omega-3 acid ethyl esters (LOVAZA) capsule 1 g  1 g Oral Daily Mike Craze, MD   1 g at 08/23/11 0813  . simvastatin (ZOCOR) tablet 20 mg  20 mg Oral q1800 Mike Craze, MD      . DISCONTD: alum & mag hydroxide-simeth (MAALOX/MYLANTA) 200-200-20 MG/5ML suspension 30 mL  30 mL Oral Q4H PRN Mike Craze, MD      . DISCONTD: ARIPiprazole (ABILIFY) tablet 15 mg  15 mg Oral Daily Mike Craze, MD       Facility-Administered Medications Ordered in Other Encounters  Medication Dose Route Frequency Provider Last Rate Last Dose  . DISCONTD: alum & mag hydroxide-simeth (MAALOX/MYLANTA) 200-200-20 MG/5ML suspension 30 mL  30 mL Oral PRN Grant Fontana, PA      . DISCONTD: ARIPiprazole (ABILIFY) tablet 5 mg  5 mg Oral Daily Grant Fontana, [place]   5 mg at 08/22/11 2023  . DISCONTD: divalproex (DEPAKOTE ER) 24 hr tablet 1,000 mg  1,000 mg Oral Daily Grant Fontana, [place]   1,000 mg at 08/22/11 2025  . DISCONTD: ibuprofen  (ADVIL,MOTRIN) tablet 600 mg  600 mg Oral Q8H PRN Grant Fontana, [place]      . DISCONTD: LORazepam (ATIVAN) tablet 1 mg  1 mg Oral Q8H PRN Grant Fontana, PA   1 mg at 08/22/11 2002  . DISCONTD: mirtazapine (REMERON) tablet 30 mg  30 mg Oral QHS Grant Fontana, [place]      . DISCONTD: ondansetron [facility]) tablet 4 mg  4 mg Oral Q8H PRN Grant Fontana, [place]      . DISCONTD: simvastatin (ZOCOR) tablet 20 mg  20 mg Oral q1800 Grant Fontana, [place]   20 mg at 08/22/11 2022  . DISCONTD: zolpidem (AMBIEN) tablet 5 mg  5 mg Oral QHS PRN Grant Fontana, PA        Lab Results:  Results for orders placed during the hospital encounter of 08/22/11 (from the past 48 hour(s))  CBC     Status: Abnormal   Collection Time   08/22/11  4:40 PM      Component Value Range Comment   WBC 7.7  4.0 - 10.5 (K/uL)    RBC 5.29  4.22 - 5.81 (MIL/uL)    Hemoglobin 17.5 (*) 13.0 - 17.0 (g/dL)    HCT 16.1  09.6 - 04.5 (%)    MCV 93.4  78.0 - 100.0 (fL)    MCH 33.1  26.0 - 34.0 (pg)    MCHC 35.4  30.0 - 36.0 (g/dL)    RDW 40.9  81.1 - 91.4 (%)    Platelets 93 (*) 150 - 400 (K/uL) PLATELET COUNT CONFIRMED BY SMEAR  COMPREHENSIVE METABOLIC PANEL     Status: Abnormal   Collection Time   08/22/11  4:40 PM      Component Value Range Comment   Sodium 138  135 - 145 (mEq/L)    Potassium 4.5  3.5 - 5.1 (mEq/L)    Chloride 100  96 - 112 (mEq/L)    CO2 30  19 - 32 (mEq/L)    Glucose, Bld 103 (*) 70 - 99 (mg/dL)    BUN 31 (*) 6 - 23 (mg/dL)    Creatinine, Ser 7.82 (*) 0.50 - 1.35 (mg/dL)    Calcium 95.6  8.4 - 10.5 (mg/dL)    Total Protein 8.1  6.0 - 8.3 (g/dL)    Albumin 4.0  3.5 - 5.2 (g/dL)    AST 23  0 - 37 (U/L)    ALT 21  0 - 53 (U/L)    Alkaline Phosphatase 68  39 - 117 (U/L)    Total Bilirubin 0.4  0.3 - 1.2 (mg/dL)    GFR calc non Af Amer 33 (*) >90 (mL/min)    GFR calc Af Amer 38 (*) >90 (mL/min)   ETHANOL     Status: Normal   Collection Time   08/22/11  4:40 PM      Component Value Range  Comment     Alcohol, Ethyl (B) <11  0 - 11 (mg/dL)   URINALYSIS, ROUTINE W REFLEX MICROSCOPIC     Status: Abnormal   Collection Time   08/22/11  5:02 PM      Component Value Range Comment   Color, Urine YELLOW  YELLOW     APPearance CLEAR  CLEAR     Specific Gravity, Urine 1.015  1.005 - 1.030     pH 6.5  5.0 - 8.0     Glucose, UA NEGATIVE  NEGATIVE (mg/dL)    Hgb urine dipstick TRACE (*) NEGATIVE     Bilirubin Urine NEGATIVE  NEGATIVE     Ketones, ur NEGATIVE  NEGATIVE (mg/dL)    Protein, ur NEGATIVE  NEGATIVE (mg/dL)    Urobilinogen, UA 0.2  0.0 - 1.0 (mg/dL)    Nitrite NEGATIVE  NEGATIVE     Leukocytes, UA NEGATIVE  NEGATIVE    URINE RAPID DRUG SCREEN (HOSP PERFORMED)     Status: Normal   Collection Time   08/22/11  5:02 PM      Component Value Range Comment   Opiates NONE DETECTED  NONE DETECTED     Cocaine NONE DETECTED  NONE DETECTED     Benzodiazepines NONE DETECTED  NONE DETECTED     Amphetamines NONE DETECTED  NONE DETECTED     Tetrahydrocannabinol NONE DETECTED  NONE DETECTED     Barbiturates NONE DETECTED  NONE DETECTED    URINE MICROSCOPIC-ADD ON     Status: Normal   Collection Time   08/22/11  5:02 PM      Component Value Range Comment   WBC, UA 0-2  <3 (WBC/hpf)    RBC / HPF 0-2  <3 (RBC/hpf)    Physical Findings: AIMS:   CIWA:   CIWA-Ar Total: 5  COWS:      Treatment Plan Summary: Daily contact with patient to assess and evaluate symptoms and progress in treatment Medication management  Risk of harm to self is elevated by is diagnosis of bipolar disorder, but he wants tot find the best combination of medications to help him manage his moods better.  Risk of harm to others is minimal in that he has not been involved in fights or had any legal charges filed on him.  Plan: We will admit the patient for crisis stabilization and treatment. I talked to pt about starting Neurontin and increasing his Depakote to better manage his anxiety and his mood respectively. I explained  the risks and benefits of medication in detail.  We will continue on q. 15 checks the unit protocol. At this time there is no clinical indication for one-to-one observation as patient contract for safety and presents little risk to harm themself and others.  We will increase collateral information. I encourage patient to participate in group milieu therapy. Pt will be seen in treatment team meeting tomorrow morning for further treatment and appropriate discharge planning. Please see history and physical note for more detailed information ELOS: 3 to 5 days.   Tamorah Hada 08/23/2011, 1:45 PM

## 2011-08-23 NOTE — Progress Notes (Addendum)
[facility] Group Notes: (Counselor/Nursing/MHT/Case Management/Adjunct) 08/23/2011   @  11:00am   Type of Therapy:  Group Therapy  Participation Level:       Billie Lade 08/23/2011  2:04 PM        [facility] Group Notes: (Counselor/Nursing/MHT/Case Management/Adjunct) 08/23/2011   @1 :15pm  Type of Therapy:  Group Therapy  Participation Level:  Did Not Attend    Billie Lade 08/23/2011 2:03 PM

## 2011-08-23 NOTE — H&P (Signed)
Psychiatric Admission Assessment Adult  Patient Identification:  [name] Date of Evaluation:  08/23/2011 Chief Complaint:  Suicidal thoughts x 2 weeks, poor sleep History of Present Illness::   First [facility] admission for [name] who was brought to the hospital by his wife, who was concerned that he been expressing suicidal thoughts for about 2 weeks. He has a history of bipolar disorder and was previously stabilized on lithium. Most recently has been started on Depakote. Lithium was stopped in June of 2012 due to renal insufficiency after nephrectomy for kidney cancer.  Since June he had an episode of mood elevation, impulsivity, resulting in some financial stressors for the family. For the past 2 weeks has been going through this depressive phase. Sleep has been poor. He reports he's had some suicidal thoughts and possibly overdosing on pills. Denies he has had any intent for suicide. Concentration is poor, and his work performance has been poor. Yesterday his boss told him to come to the hospital and get help. He denies suicidal thoughts today, denies psychotic symptoms. Asking for help getting his medications include straightened out.  He presents disheveled with variable eye contact, anxious affect, cooperative, pleasant with blunt affect and minimal speech production.   Past psychiatric history:  Currently followed as an outpatient by Dr. Girtha Hake ski. Recently started on Depakote, and Abilify. He has a history of previous hospitalizations on our psychiatric unit was in: Hospital. Previously on lithium and discontinued as noted above, he denies any history of substance abuse.  Mood Symptoms:  Concentration, Depression, SI, Depression Symptoms:  depressed mood, insomnia, suicidal thoughts with specific plan, (Hypo) Manic Symptoms:  Impulsivity, Anxiety Symptoms:  Poor sleep, restlessness, racing thoughts Psychotic Symptoms:  None evident, denies  Past Psychiatric History:see  above Diagnosis:  Hospitalizations:  Outpatient Care:  Substance Abuse Care:  Self-Mutilation:  Suicidal Attempts:  Violent Behaviors:   Past Medical History:  Also history of hypogonadism. Past Medical History  Diagnosis Date  . Renal cell carcinoma     chemo and xrt--lost rt kidney  . Childhood asthma   . Sinusitis   . Cellulitis     after left foot surgery  . Osteoarthritis   . OSA (obstructive sleep apnea)     NPSG 05-08-07 AHI 63.1/hr,desat t0 80%/loud snoring  . Bipolar 1 disorder   . Hyperlipemia   . Mental disorder    Allergies:  No Known Allergies PTA Medications: Prescriptions prior to admission  Medication Sig Dispense Refill  . ARIPiprazole (ABILIFY) 5 MG tablet Take 5 mg by mouth daily.      . divalproex (DEPAKOTE ER) 250 MG 24 hr tablet Take 1,000 mg by mouth daily.       Marland Kitchen LORazepam (ATIVAN) 0.5 MG tablet Take 0.5 mg by mouth every 8 (eight) hours. prn      . mirtazapine (REMERON) 30 MG tablet Take 30 mg by mouth at bedtime.      . Coenzyme Q10 (COQ10) 100 MG CAPS Take 1 capsule by mouth daily.        . Glucosamine-Chondroitin (GLUCOSAMINE CHONDR COMPLEX PO) Take 1 capsule by mouth daily.        . Multiple Vitamin (MULTIVITAMIN) tablet Take 1 tablet by mouth daily.        . Omega-3 Fatty Acids (FISH OIL) 1200 MG CAPS Take 1 capsule by mouth daily.        . pravastatin (PRAVACHOL) 40 MG tablet Take 1 tablet by mouth daily.      Marland Kitchen  testosterone enanthate (DELATESTRYL) 200 MG/ML injection Inject 200 mg into the muscle every 14 (fourteen) days. Apply as directed      . zolpidem (AMBIEN) 10 MG tablet Take 10 mg by mouth at bedtime as needed.         Previous Psychotropic Medications:see above  Medication/Dose                 Substance Abuse History in the last 12 months:no known hx of substance abuse. 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 Wife supportive. History of college education. No current legal problems.  Work stressors recently due to poor concentration and performance.   Current Place of Residence:   Place of Birth:   Family Members: Marital Status:  Married Children:  Sons:  Daughters: Relationships: Education:  Corporate treasurer Problems/Performance: Religious Beliefs/Practices: History of Abuse (Emotional/Phsycial/Sexual) Teacher, music History:   Legal History: Hobbies/Interests:  Family History:  Not available Family History  Problem Relation Age of Onset  . Hypertension    . Sleep apnea    . Emphysema    . Prostate cancer      Mental Status Examination/Evaluation: Objective:  Appearance: Disheveled  Eye Contact::  Minimal  Speech:  Normal Rate  Volume:  Normal  Mood:  Anxious  Affect:  Congruent  Thought Process:  Coherent  Orientation:  Full  Thought Content:  Denies SI, asking for help with mood and meds.  Suicidal Thoughts:  No  Homicidal Thoughts:  No  Memory:  Immediate;   Fair Recent;   Fair Remote;   Fair  Judgement:  Poor  Insight:  Partial   Psychomotor Activity:  Normal  Concentration:  poor  Recall:  Fair  Akathisia:  No  Handed:    AIMS (if indicated):   0  Assets:  Desire for Improvement Housing Intimacy Leisure Time  Sleep:  Number of Hours: 6.25    Medical Evaluation: 94 Kgs, Adequately Nourished and hydrated. UDS negative, ETOH screen negative, Creatinine 2.09 with normal electrolytes.  Other basic labs pending. I have medically and physically evaluated this patient and my findings are consistent with those of the emergency room.   Laboratory/X-Ray Psychological Evaluation(s)      Assessment:    AXIS I:  Bipolar Disorder Type I,  depressed AXIS II:  No diagnosis AXIS III:   Past Medical History  Diagnosis Date  . Renal cell carcinoma     chemo and xrt--lost rt kidney  . Childhood asthma   . Sinusitis   . Cellulitis     after left foot surgery  . Osteoarthritis   . OSA (obstructive sleep apnea)     NPSG 05-08-07 AHI 63.1/hr,desat t0 80%/loud snoring  . Bipolar 1 disorder   . Hyperlipemia   . Mental disorder    AXIS IV:  deferred AXIS V:  41-50 serious symptoms  Treatment Plan  Summary: Daily contact with patient to assess and evaluate symptoms and progress in treatment Medication management Current Medications:  Current Facility-Administered Medications  Medication Dose Route Frequency Provider Last Rate Last Dose  . acetaminophen (TYLENOL) tablet 650 mg  650 mg Oral Q6H PRN Orson Aloe, MD      . alum & mag hydroxide-simeth (MAALOX/MYLANTA) 200-200-20 MG/5ML suspension 30 mL  30 mL Oral Q4H PRN Orson Aloe, MD      . ARIPiprazole (ABILIFY) tablet 15 mg  15 mg Oral QHS Randy Readling, MD      . divalproex (DEPAKOTE ER) 24 hr tablet 1,000 mg  1,000 mg Oral QHS Orson Aloe, MD      . LORazepam (ATIVAN) tablet 0.5 mg  0.5 mg Oral Q8H PRN Orson Aloe, MD      . magnesium hydroxide (MILK OF MAGNESIA) suspension 30 mL  30 mL Oral Daily PRN Orson Aloe, MD   30 mL at 08/23/11 0814  . mirtazapine (REMERON) tablet 15 mg  15 mg Oral QHS Orson Aloe, MD   15 mg at 08/22/11 2258  . omega-3 acid ethyl esters (LOVAZA) capsule 1 g  1 g Oral Daily Orson Aloe, MD   1 g at 08/23/11 0813  . simvastatin (ZOCOR) tablet 20 mg  20 mg Oral q1800 Orson Aloe, MD      . DISCONTD: ARIPiprazole (ABILIFY) tablet 15 mg  15 mg Oral Daily Orson Aloe, MD       Facility-Administered Medications Ordered in Other Encounters  Medication Dose Route Frequency Provider Last Rate Last Dose  . DISCONTD: alum & mag hydroxide-simeth (MAALOX/MYLANTA) 200-200-20 MG/5ML suspension 30 mL  30 mL Oral PRN Grant Fontana, PA      .  DISCONTD: ARIPiprazole (ABILIFY) tablet 5 mg  5 mg Oral Daily Grant Fontana, [place]   5 mg at 08/22/11 2023  . DISCONTD: divalproex (DEPAKOTE ER) 24 hr tablet 1,000 mg  1,000 mg Oral Daily Grant Fontana, [place]   1,000 mg at 08/22/11 2025  . DISCONTD: ibuprofen (ADVIL,MOTRIN) tablet 600 mg  600 mg Oral Q8H PRN Grant Fontana, [place]      . DISCONTD: LORazepam (ATIVAN) tablet 1 mg  1 mg Oral Q8H PRN Grant Fontana, PA   1 mg at 08/22/11 2002  . DISCONTD: mirtazapine (REMERON) tablet 30 mg  30 mg Oral QHS Grant Fontana, [place]      . DISCONTD: ondansetron [facility]) tablet 4 mg  4 mg Oral Q8H PRN Grant Fontana, [place]      . DISCONTD: simvastatin (ZOCOR) tablet 20 mg  20 mg Oral q1800 Grant Fontana, [place]   20 mg at 08/22/11 2022  . DISCONTD: zolpidem (AMBIEN) tablet 5 mg  5 mg Oral QHS PRN Grant Fontana, PA        Observation Level/Precautions:    Laboratory:    Psychotherapy:    Medications:    Routine PRN Medications:    Consultations:    Discharge Concerns:    Other:     Ranee Peasley A 3/8/201310:29 AM

## 2011-08-24 LAB — TESTOSTERONE: Testosterone: 1424.98 ng/dL — ABNORMAL HIGH (ref 250–890)

## 2011-08-24 LAB — T4, FREE: Free T4: 0.94 ng/dL (ref 0.80–1.80)

## 2011-08-24 LAB — T3, FREE: T3, Free: 3 pg/mL (ref 2.3–4.2)

## 2011-08-24 MED ORDER — LORAZEPAM 1 MG PO TABS: 1.0000 mg | ORAL_TABLET | ORAL | Status: DC | PRN

## 2011-08-24 MED ORDER — BISACODYL 5 MG PO TBEC
10.0000 mg | DELAYED_RELEASE_TABLET | Freq: Once | ORAL | Status: AC
  Administered 2011-08-24: 10 mg via ORAL
  Filled 2011-08-24: qty 2

## 2011-08-24 MED ORDER — DIVALPROEX SODIUM ER 500 MG PO TB24
500.0000 mg | ORAL_TABLET | Freq: Every day | ORAL | Status: DC
  Administered 2011-08-24 – 2011-08-29 (×6): 500 mg via ORAL
  Filled 2011-08-24 (×7): qty 1
  Filled 2011-08-24: qty 2
  Filled 2011-08-24 (×2): qty 1

## 2011-08-24 MED ORDER — ZOLPIDEM TARTRATE 10 MG PO TABS
10.0000 mg | ORAL_TABLET | Freq: Every evening | ORAL | Status: DC | PRN
  Filled 2011-08-24: qty 1

## 2011-08-24 NOTE — Progress Notes (Signed)
Patient ID: [name], male   DOB: [birth year], 61 y.o.   MRN: [id number]  [facility] Group Notes:  (Counselor/Nursing/MHT/Case Management/Adjunct)  08/24/2011 1:15 PM  Type of Therapy:  Group Therapy, Dance/Movement Therapy   Participation Level:  Active  Participation Quality:  Appropriate  Affect:  Appropriate  Cognitive:  Appropriate  Insight: Limited  Engagement in Group:  Limited  Engagement in Therapy:  Limited  Modes of Intervention:  Clarification, Problem-solving, Role-play, Socialization and Support  Summary of Progress/Problems: Therapist discussed and distributed hand out the 7 signs of self-sabotaging behaviors with group. Therapist discussed how negative thoughts can led to self -sabotaging behaviors.Therapist discussed and disturbed positive affirmation hand out.  Pt. stated that their main self sabotaging behavior is "beingstuck because of fear".  Pt.'s positive affirmation is "I will overcome".      Rhunette Croft

## 2011-08-24 NOTE — Progress Notes (Signed)
Patient ID: [name], male   DOB: 1951/01/31, 61 y.o.   MRN: [id number]  Pt. attended and participated in aftercare planning group. Pt. accepted information on suicide prevention, warning signs to look for with suicide and crisis line numbers to use. The pt. agreed to call crisis line numbers if having warning signs or having thoughts of suicide. Pt. listed their current anxiety level as a 2 and his depression as a 2 on a scale of 1-10. He shared that he was feeling bad because of his medications and that he did not sleep well last night. Pt denied S/I or H/I.

## 2011-08-24 NOTE — Progress Notes (Signed)
Pt has attended the groups and interacts with select staff. Talked today about his inability to hold a job at this point due to his mood swings and lability. Also verbalizes fear r/t caring for his family if he can't work. Did share a part of his humor with this Clinical research associate and then talked about the guy he use to be. Shared concerns over money and his worry about how he will pay his bills. Denies SI and HI.

## 2011-08-24 NOTE — Progress Notes (Signed)
[facility] Adult Inpatient Family/Significant Other Suicide Prevention Education  Suicide Prevention Education:  Education Completed; Nichlas Pitera- has been identified by the patient as the family member/significant other with whom the patient will be residing, and identified as the person(s) who will aid the patient in the event of a mental health crisis (suicidal ideations/suicide attempt).  With written consent from the patient, the family member/significant other has been provided the following suicide prevention education, prior to the and/or following the discharge of the patient.  The suicide prevention education provided includes the following:  Suicide risk factors  Suicide prevention and interventions  National Suicide Hotline telephone number  [facility] assessment telephone number  [facility] Emergency Assistance 911  [facility] and/or Residential Mobile Crisis Unit telephone number  Request made of family/significant other to:  Remove weapons (e.g., guns, rifles, knives), all items previously/currently identified as safety concern. Pt.'s wife sates that there are no guns or weapons in the home   Remove drugs/medications (over-the-counter, prescriptions, illicit drugs), all items previously/currently identified as a safety concern.Pt.'s  Wife states that she monitors the pt.'s medications and has no concerns . It was discussed with the pt.'s wife that in his ED report that the pt. Was having SI Ideation with plan to overdose on pills. Pt.'s wife states she gives pt.'s medication in a pill organizer and monitor's his intake.  Pt.'s wife is frustrated and wants to know more about the pt.'s treatment and game plan for the pt. She asked account d/c and follow up and was told that according to note in Epic that his caseworker will be setting up his follow up with Dr. Weber Cooks. Pt. Wife would like to be called by the counselor, case manger, or doctor next  week on 08/26/11 to find out more information about what is going on with the t. The pt. Signed a balk consent form on 08/22/11 and wife name was filled in on 08/24/11 giving consent to talk to his wife.  The family member/significant other verbalizes understanding of the suicide prevention education information provided.  The family member/significant other agrees to remove the items of safety concern listed above.  Neila Gear 08/24/2011, 2:56 PM

## 2011-08-24 NOTE — Progress Notes (Signed)
Patient ID: [name], male   DOB: 26-Jul-1950, 61 y.o.   MRN: [id number] The patient is very anxious and concerned that his mania is not going to stabilize. Reports that Lithium was the only medication that worked well for him but had to come off of it after losing a kidney due to cancer and renal insufficiency last June. He stated that he has been on a spending spree and unable to function well at work. Tremors noted both hands. Denies any substance abuse. States the medication he is on is causing the tremors. C/O constipation. Had been medicated earlier today with M.O.M. With no results and now was requesting Ducolax. Order obtained and the patient was medicated. Denies any suicidal ideation at present.

## 2011-08-24 NOTE — Progress Notes (Signed)
Patient ID: [name], male   DOB: May 15, 1951, 61 y.o.   MRN: [id number]  Presents with grim affect, disheveled and with poor hygiene.  Slept poorly last night and says it took a long time to get his CPAP set up and he needs it  Earlier.  Also complaining of constipation unrelieved by Dulcolax .  Appetite is fairly good.   Rates his depression a 2/10, Anxiety  2/10, and hopelessness a 3/10.  We discussed support system.Marland Kitchen He is interested in speaking with one of our pastoral counselors.  He has been active in his church at various times - thinks he could use some pastoral support.   He is also concerned about pressures at home - he is primary breadwinner since wife lost her job and he feels pressured to get back to work soon even though he is not sure he can handle the job pressures anymore. He is free of kidney cancer.    VPA 43.2.  Discussed medication risks, benefits and functions.   Plan: Pastoral Services referral. 2 tabs Dulcolax today. Dry mouth drops. D/C gabapentin for anxiety - he's not sure why he needs to be on this.  Will restart Ambien that had helped him sleep. Increase Depakote to 1500mg  total daily.

## 2011-08-24 NOTE — Progress Notes (Signed)
Pt's wife came to family forum. She was given general information on programming and therapy progress of the pt. She had additional medical questions that were deferred to the pt's nurse who agreed to talk to her during visitation.

## 2011-08-24 NOTE — Progress Notes (Signed)
Chaplain Note: Nurse Practitioner ordered a Chaplain visit for this patient.  Chaplain arrived at [facility] at approximately 2PM.  The patient spoke of two main directions: 1. To go home and 2. To be able to provide for his wife in the future should he not be bale to go back to work (which he felt his old job was not going to be a place where he could go, though he said they would like him to come back).  We talked about all the various financial vehicles available to him, and I think he began to see that God was caring for him and may have known he would come to this place.  We reviewed his prior experience with cancer (10 years ago) and the doubts he had then, and that here he is now.  God will not leave him.  God is not done with him.  The cancer experience should serve as reminder of how much God has cared for him, and will not suddenly change his mind.  He expressed some doubt of his wife staying with him (they have been married 18 years), I believe tied tot the support issue (she is out of work, on a Customer service manager). He had future insurance premium concerns, but the discussion of his potential viable resources seemed to allay some of his apprehensions.  To some extent, I think he was shot-gunning his concerns, and had some general anxiety.  He was clear about his desire to go home.  We concluded that for going home, he should speak frankly to his doctor and participate in his care plan.  The patient is articulate and realistic.  Though bi-polar, he has apparently been faithful in his medication regimen.  It is conceive able that these same concerns may arise again. Prayer is significant to him.  He initially said he wanted to bring a higher power into his life.  We framed all the discussion in the context of that Higher Power already moving to deal with the patient's concerns.  Rema Jasmine, Chaplain Group Pager: [phone] Local Pager: [phone]

## 2011-08-25 LAB — COMPREHENSIVE METABOLIC PANEL
ALT: 22 U/L (ref 0–53)
AST: 21 U/L (ref 0–37)
Albumin: 3.7 g/dL (ref 3.5–5.2)
Alkaline Phosphatase: 58 U/L (ref 39–117)
Chloride: 94 mEq/L — ABNORMAL LOW (ref 96–112)
Creatinine, Ser: 2.15 mg/dL — ABNORMAL HIGH (ref 0.50–1.35)
Potassium: 4.4 mEq/L (ref 3.5–5.1)
Sodium: 132 mEq/L — ABNORMAL LOW (ref 135–145)
Total Bilirubin: 0.4 mg/dL (ref 0.3–1.2)

## 2011-08-25 MED ORDER — ZOLPIDEM TARTRATE 5 MG PO TABS
5.0000 mg | ORAL_TABLET | Freq: Every evening | ORAL | Status: DC | PRN
  Administered 2011-08-25 – 2011-08-28 (×3): 5 mg via ORAL
  Filled 2011-08-25 (×3): qty 1

## 2011-08-25 NOTE — Progress Notes (Signed)
Patient ID: [name], male   DOB: [birth year], 61 y.o.   MRN: [id number]  Pt. attended and participated in aftercare planning group. Pt. accepted information on suicide prevention, warning signs to look for with suicide and crisis line numbers to use. The pt. agreed to call crisis line numbers if having warning signs or having thoughts of suicide. Pt. Shared that he slept "great" last night, but he was dealing with an "Ambien hangover". Pt listed their current anxiety level as a 1 and depression as a 2 on a scale of 1-10. Pt denied S/I and H/I. Pt wants to be D/C as soon as possible because he says it is difficult on his wife to commute to [facility] and that he would be better off at home.

## 2011-08-25 NOTE — Progress Notes (Signed)
[facility] Group Notes:  (Counselor/Nursing/MHT/Case Management/Adjunct)  08/25/2011 5:36 PM  Type of Therapy:  Group Therapy  Participation Level:  Active  Participation Quality:  Appropriate  Affect:  Appropriate  Cognitive:  Appropriate  Insight:  Good  Engagement in Group:  Good  Engagement in Therapy:  Good  Modes of Intervention:  Clarification, Problem-solving, Socialization and Support  Summary of Progress/Problems: Pt. participated in group session on supports, and identified who their supports are in their lives and how they support them. Each pt. Spoke about identifying was to support themselves when their supports are not available. Each pt. participated in activity on how support feels verses just saying you have support. The pt. spoke about his wife being a support to him and that he was doing well today.  [name] 08/25/2011, 5:36 PM

## 2011-08-25 NOTE — Progress Notes (Signed)
Patient ID: [name], male   DOB: 1950-10-30, 61 y.o.   MRN: [id number] The patient has an anxious mood and affect. He worries if he is going to get better and questions staff frequently about his treatment and medication. His participation in group and social interactions are minimal. Tremors are still present in both hands. He states these tremors started here in the hospital after starting on his medication. Denies any suicidal ideation. Reports his stomach was feeling "all tore up" and he thinks it could be due to all the laxatives he has had over the past few days.

## 2011-08-25 NOTE — Progress Notes (Signed)
Patient ID: [name], male   DOB: Oct 30, 1950, 61 y.o.   MRN: [id number] [name] presents fully alert this morning with a coarse tremor that he says began when he was on lithium. Reports that he did not have this tremor in the past when he was on Depakote only. Depakote was increased to 1500 mg ER yesterday. And Ambien was added in hopes of getting him to sleep better last night. He says he feels groggy and had difficulty waking up this morning.  And we discussed reducing his Ambien 5 mg each bedtime. He reports he feels anxious to go home. He is asked a lot of questions about medications and the risks and benefits of Depakote which I reviewed with him again today. He would like to be discharged to followup with Dr. Donell Beers.  He has consistently denied suicidal thoughts and denies them again today. Reports he is not anxious and not depressed. Visiting him here is a big burden on his wife.   Presents disheveled, with a blunt and somewhat irritable affect.  He denies suicidal thoughts, but has denied them consistently even though his wife says he has had suicidal thoughts for the past two weeks. His insight remains limited.  He is taking medications as prescribed.  Still has concerns about going back to work.   He is grateful for the pastoral counseling session and feels it was very helpful.   Plan: VPA level Reduce Ambien to 5mg  q hs.

## 2011-08-25 NOTE — Progress Notes (Signed)
Pt has attended the groups and interacts with some of his peers. Worried about his wife and having to travel here and the burden his illness is placing on her. Eye contract is a bit improved since yesterday. Continues to be constipated and was given prune juice to help get his bowels moving again. Also encouraged to go outside and get a bit of exercise in hopes of stimulating his intestines.  Denies SI and HI. Given support, reassurance and praise.

## 2011-08-26 ENCOUNTER — Encounter (HOSPITAL_COMMUNITY): Payer: Self-pay | Admitting: *Deleted

## 2011-08-26 DIAGNOSIS — F319 Bipolar disorder, unspecified: Secondary | ICD-10-CM

## 2011-08-26 LAB — VALPROIC ACID LEVEL: Valproic Acid Lvl: 73 ug/mL (ref 50.0–100.0)

## 2011-08-26 MED ORDER — MAGNESIUM CITRATE PO SOLN
1.0000 | Freq: Once | ORAL | Status: AC
  Administered 2011-08-26: 1 via ORAL

## 2011-08-26 MED ORDER — MIRTAZAPINE 7.5 MG PO TABS
7.5000 mg | ORAL_TABLET | Freq: Every day | ORAL | Status: DC
  Administered 2011-08-27 – 2011-08-28 (×3): 7.5 mg via ORAL
  Filled 2011-08-26 (×6): qty 1

## 2011-08-26 MED ORDER — PRAZOSIN HCL 1 MG PO CAPS
1.0000 mg | ORAL_CAPSULE | Freq: Every evening | ORAL | Status: DC | PRN
  Administered 2011-08-27 – 2011-08-28 (×2): 1 mg via ORAL
  Filled 2011-08-26 (×2): qty 1

## 2011-08-26 MED ORDER — PRAZOSIN HCL 1 MG PO CAPS
1.0000 mg | ORAL_CAPSULE | Freq: Every day | ORAL | Status: DC
  Administered 2011-08-27: 1 mg via ORAL
  Filled 2011-08-26 (×4): qty 1

## 2011-08-26 NOTE — Progress Notes (Signed)
[facility] Group Notes:  (Counselor/Nursing/MHT/Case Management/Adjunct)  08/26/2011 11:56 AM  Type of Therapy:  Group Therapy  Participation Level:  Minimal  Participation Quality:  Appropriate  Affect:  Appropriate  Cognitive:  Appropriate  Insight:  None  Engagement in Group:  None  Engagement in Therapy:  None  Modes of Intervention:  Support  Summary of Progress/Problems: Patient attended group but did not participate and left early.   Mendleson, Concettina Leth 08/26/2011, 11:56 AM

## 2011-08-26 NOTE — Progress Notes (Signed)
Pt did not attend d/c planning group on this date.  SW met with pt individually at this time.  Pt was laying in the bed and presents with irritated affect and mood.  Pt states that he can't get up for group because he didn't sleep well last night and is constipated.  Pt states that he is waiting for something for the constipation from the nurse.  Pt ranks depression and anxiety at a 5 today.  Pt denies SI.  Pt will follow up with Dr. Donell Beers for medication management and therapy.  No further needs voiced at this time.   [name], LCSWA 08/26/2011  10:51 AM

## 2011-08-26 NOTE — Progress Notes (Signed)
Patient ID: [name], male   DOB: Nov 11, 1950, 61 y.o.   MRN: [id number] Pt. Denies lethality and A/V/H's: Pt. Continues to have intention tremors and worries constantly about his bowels and c/o constipation, despite repeated laxatives. Wife here to visit this PM and worries about his tremors and wonders if his medication or withdrawal from lithium is causing them.  Pt. Continues to have a flat affect and looks depressed.

## 2011-08-26 NOTE — ED Notes (Signed)
Pt denies any si/hi.

## 2011-08-26 NOTE — ED Notes (Signed)
Pt states he is having abdominal pain. Pt states he has not had a full bowel movement in 3 days.pt states he has not sleep in days and he is a pt at behavioral health. Pt states he was sent over to wled with a tech from [facility] but she had 3 other pt's. Pt is with his wife

## 2011-08-26 NOTE — Tx Team (Addendum)
Interdisciplinary Treatment Plan Update (Adult)  Date:  08/26/2011  Time Reviewed:  10:26 AM   Progress in Treatment: Attending groups: Yes Participating in groups:  Yes Taking medication as prescribed: Yes Tolerating medication:  Yes Family/Significant othe contact made:  Yes - contact made with wife Patient understands diagnosis:  Yes Discussing patient identified problems/goals with staff:  Yes Medical problems stabilized or resolved:  Yes Denies suicidal/homicidal ideation: Yes Issues/concerns per patient self-inventory:  None identified Other: N/A  New problem(s) identified: None Identified  Reason for Continuation of Hospitalization: Anxiety Depression Medication stabilization  Interventions implemented related to continuation of hospitalization: mood stabilization, medication monitoring and adjustment, group therapy and psycho education, safety checks q 15 mins  Additional comments: N/A  Estimated length of stay: 2-3 days  Discharge Plan: Pt will follow up with Dr. Donell Beers for medication management.    New goal(s): N/A  Review of initial/current patient goals per problem list:    1.  Goal(s): Reduce depressive symptoms  Met:  No  Target date: by discharge  As evidenced by: Reducing depression from a 10 to a 3 as reported by pt.  Pt ranks at a 5 today.    2.  Goal (s): Reduce/Eliminate suicidal ideation  Met:  No  Target date: by discharge  As evidenced by: pt reporting no SI.    3.  Goal(s): Reduce anxiety symptoms  Met:  No  Target date: by discharge  As evidenced by: Reduce anxiety from a 10 to a 3 as reported by pt. Pt ranks at a 5 today.     Attendees: Patient:     Family:     Physician:  Orson Aloe, MD  08/26/2011  10:26 AM   Nursing:   Quintella Reichert, RN 08/26/2011 10:27 AM   Case Manager:  Reyes Ivan, LCSWA 08/26/2011  10:26 AM   Counselor:  Angus Palms, LCSW 08/26/2011  10:26 AM   Other:  Juline Patch, LCSW 08/26/2011  10:26  AM   Other:   08/26/2011  10:26 AM   Other:  Carolynn Comment, RN 08/26/2011 10:27 AM   Other:  Charlyne Mom, counseling intern 08/26/2011 10:27 AM    Scribe for Treatment Team:   Carmina Miller, 08/26/2011 , 10:26 AM

## 2011-08-26 NOTE — Progress Notes (Signed)
Patient ID: [name], male   DOB: Jul 18, 1950, 61 y.o.   MRN: [id number] The patient is more visible on the unit and interacting in the milieu. He has a waxy, flat affect and a notable coarse tremor. Denies any suicidal ideation. Would like to be discharged and continue treatment with his psychiatrist on an out patient basis, because he feels this hospitalization has been a burden on his wife. Continues to c/o constipation. Encouraged to increase his fluid and fiber intact.

## 2011-08-26 NOTE — ED Notes (Signed)
HYQ:[id number]<NG> Expected date:08/26/11<BR> Expected time: 7:44 PM<BR> Means of arrival:Ambulance<BR> Comments:<BR> EMS M12 GC -- Abdominal Pain

## 2011-08-26 NOTE — Progress Notes (Signed)
[facility] Group Notes:  (Counselor/Nursing/MHT/Case Management/Adjunct)    Type of Therapy:  Group Therapy  Participation Level:  Did Not Attend       Billie Lade 08/26/2011  2:48 PM

## 2011-08-27 ENCOUNTER — Telehealth: Payer: Self-pay

## 2011-08-27 LAB — PULMONARY FUNCTION TEST

## 2011-08-27 MED ORDER — POLYETHYLENE GLYCOL 3350 17 GM/SCOOP PO POWD: 17.0000 g | Freq: Every day | ORAL | Status: AC

## 2011-08-27 NOTE — Progress Notes (Signed)
[facility] MD Progress Note  08/27/2011 1:55 PM  Diagnosis:  Axis I: Bipolar, Depressed  ADL's:  Intact  Sleep: Fair  Appetite:  Good  Suicidal Ideation:  Denies adamantly any suicidal thoughts. Homicidal Ideation:  Denies adamantly any homicidal thoughts.  Mental Status Examination/Evaluation: Objective:  Appearance: Casual  Eye Contact::  Good  Speech:  Clear and Coherent  Volume:  Normal  Mood:  2 /10 on a scale of 1 is the best and 10 is the worst  Anxiety:  2 or 3 /10 on the same scale  Affect:  Congruent  Thought Process:  Coherent  Orientation:  Full  Thought Content:  WDL  Suicidal Thoughts:  No  Homicidal Thoughts:  No  Memory:  Immediate;   Good  Judgement:  Fair  Insight:  Fair  Psychomotor Activity:  Normal  Concentration:  Fair  Recall:  Fair  Akathisia:  No  AIMS (if indicated):     Assets:  Communication Skills Desire for Improvement Financial Resources/Insurance Housing Intimacy Leisure Time Physical Health  Sleep:  Number of Hours: 6    Vital Signs:Blood pressure 123/79, pulse 79, temperature 97.6 F (36.4 C), temperature source Oral, resp. rate 18, height 6\' 1"  (1.854 m), weight 91.627 kg (202 lb), SpO2 100.00%. Current Medications: Current Facility-Administered Medications  Medication Dose Route Frequency Provider Last Rate Last Dose  . acetaminophen (TYLENOL) tablet 650 mg  650 mg Oral Q6H PRN Mike Craze, MD   650 mg at 08/27/11 0334  . ARIPiprazole (ABILIFY) tablet 15 mg  15 mg Oral QHS Curlene Labrum Readling, MD   15 mg at 08/27/11 0426  . bisacodyl (DULCOLAX) EC tablet 5 mg  5 mg Oral Daily PRN Sanjuana Kava, NP   5 mg at 08/26/11 0058  . calcium carbonate (TUMS - dosed in mg elemental calcium) chewable tablet 200 mg of elemental calcium  1 tablet Oral Q4H PRN Viviann Spare, NP      . divalproex (DEPAKOTE ER) 24 hr tablet 1,000 mg  1,000 mg Oral QHS Mike Craze, MD   1,000 mg at 08/27/11 0426  . divalproex (DEPAKOTE ER) 24 hr tablet 500 mg   500 mg Oral Daily Viviann Spare, NP   500 mg at 08/27/11 7829  . magnesium hydroxide (MILK OF MAGNESIA) suspension 30 mL  30 mL Oral Daily PRN Mike Craze, MD   30 mL at 08/26/11 0059  . mirtazapine (REMERON) tablet 7.5 mg  7.5 mg Oral QHS Mike Craze, MD   7.5 mg at 08/27/11 0425  . omega-3 acid ethyl esters (LOVAZA) capsule 1 g  1 g Oral Daily Mike Craze, MD   1 g at 08/27/11 0829  . prazosin (MINIPRESS) capsule 1 mg  1 mg Oral QHS Mike Craze, MD      . prazosin (MINIPRESS) capsule 1 mg  1 mg Oral QHS PRN,MR X 1 Mike Craze, MD   1 mg at 08/27/11 0425  . simvastatin (ZOCOR) tablet 20 mg  20 mg Oral q1800 Mike Craze, MD   20 mg at 08/27/11 0426  . zolpidem (AMBIEN) tablet 5 mg  5 mg Oral QHS PRN Viviann Spare, NP   5 mg at 08/27/11 0335  . DISCONTD: mirtazapine (REMERON) tablet 15 mg  15 mg Oral QHS Mike Craze, MD   15 mg at 08/25/11 2147    Lab Results:  Results for orders placed during the hospital encounter of 08/22/11 (from the  past 48 hour(s))  VALPROIC ACID LEVEL     Status: Normal   Collection Time   08/25/11  7:20 PM      Component Value Range Comment   Valproic Acid Lvl 73.0  50.0 - 100.0 (ug/mL)   COMPREHENSIVE METABOLIC PANEL     Status: Abnormal   Collection Time   08/25/11  7:20 PM      Component Value Range Comment   Sodium 132 (*) 135 - 145 (mEq/L)    Potassium 4.4  3.5 - 5.1 (mEq/L)    Chloride 94 (*) 96 - 112 (mEq/L)    CO2 31  19 - 32 (mEq/L)    Glucose, Bld 109 (*) 70 - 99 (mg/dL)    BUN 27 (*) 6 - 23 (mg/dL)    Creatinine, Ser 4.09 (*) 0.50 - 1.35 (mg/dL)    Calcium 81.1  8.4 - 10.5 (mg/dL)    Total Protein 7.0  6.0 - 8.3 (g/dL)    Albumin 3.7  3.5 - 5.2 (g/dL)    AST 21  0 - 37 (U/L)    ALT 22  0 - 53 (U/L)    Alkaline Phosphatase 58  39 - 117 (U/L)    Total Bilirubin 0.4  0.3 - 1.2 (mg/dL)    GFR calc non Af Amer 32 (*) >90 (mL/min)    GFR calc Af Amer 37 (*) >90 (mL/min)     Physical Findings: AIMS:  , ,  ,  ,    CIWA:   CIWA-Ar Total: 5  COWS:     Treatment Plan Summary: Daily contact with patient to assess and evaluate symptoms and progress in treatment Medication management  Plan: His bowels have switched to very loose.  ED suggested Miralax and high fiber diet.  He got some sleep on Minipress, Ambien, and low dose Remeron.  He seems calmer today and better able to hold a give a take conversation. Get Depakote level tonight and think about D/C in AM. Amair Shrout 08/27/2011, 1:55 PM

## 2011-08-27 NOTE — ED Notes (Signed)
Pt declining transport back to [facility] he wants to talk with MD-Dr. Patria Mane made aware

## 2011-08-27 NOTE — Progress Notes (Signed)
[facility] Group Notes: (Counselor/Nursing/MHT/Case Management/Adjunct) 08/27/2011   @  11:00am   Type of Therapy:  Group Therapy  Participation Level:  None  Participation Quality:  Inattentive  Affect:  Anxious, Blunted  Cognitive:  Appropriate  Insight:  None  Engagement in Group: None  Engagement in Therapy:  None  Modes of Intervention:  Support and Exploration  Summary of Progress/Problems: Colter was not engaged with the group and did not seem to be paying attention. He presented as anxious - fidgeting and trembling hands - but his facial expression was quite blunted. He did not make eye contact with anyone, but looked at this hands or floor throughout the whole group.  Billie Lade 08/27/2011 1:39 PM

## 2011-08-27 NOTE — Progress Notes (Signed)
Patient returned from ED early this morning.  Has been visible on in the milieu today.  Met at length with the doctor today.  Attending some groups.  Interacting well with staff and peers.  Has been pleasant and cooperative this shift.

## 2011-08-27 NOTE — Progress Notes (Signed)
Pt did not attend d/c planning group on this date.  SW met with pt individually at this time.  Pt was up moving around his room and looks noticeably better today.  Pt states that he went to the ED last night for his constipation.  Pt states that he hasn't been able to sleep the past few days because of his upset stomach.  Pt states that he was able to sleep last night at the ED and is in a better mood today.  Pt states that he wants to get home soon to get better sleep.  Pt states that he is feeling stable on his meds and hopes to d/c tomorrow.  Pt ranks depression at a 2 and denies anxiety and SI.  Pt has follow up scheduled with Dr. Donell Beers.  No further needs at this time.    [name], LCSWA 08/27/2011  1:54 PM

## 2011-08-27 NOTE — ED Provider Notes (Signed)
History     CSN: [id number]  Arrival date & time 08/26/11  2013   First MD Initiated Contact with Patient 08/27/11 0211      Chief Complaint  Patient presents with  . Constipation     The history is provided by the patient.  pt reports no BM x 3 days, now with crampy abdominal pain. No nausea or vomiting. No fever or chills. No melena. Pain is mild. No hx of bowel obstruction. Unrelated to his complaint of abdominal pain he also reports poor sleep since his admission to [facility]. Still eating and drinking without difficulty. Pt reports they are using a stool softner without improvements. He has not received enemas.   Past Medical History  Diagnosis Date  . Renal cell carcinoma     chemo and xrt--lost rt kidney  . Childhood asthma   . Sinusitis   . Cellulitis     after left foot surgery  . Osteoarthritis   . OSA (obstructive sleep apnea)     NPSG 05-08-07 AHI 63.1/hr,desat t0 80%/loud snoring  . Bipolar 1 disorder   . Hyperlipemia   . Mental disorder     Past Surgical History  Procedure Date  . Podiatric surgery     left foot toes,   . Bunionectomy     Family History  Problem Relation Age of Onset  . Hypertension    . Sleep apnea    . Emphysema    . Prostate cancer      History  Substance Use Topics  . Smoking status: Never Smoker   . Smokeless tobacco: Not on file  . Alcohol Use: Yes     6 beers a uear      Review of Systems  Gastrointestinal: Positive for constipation.  All other systems reviewed and are negative.    Allergies  Review of patient's allergies indicates no known allergies.  Home Medications   Current Outpatient Rx  Name Route Sig Dispense Refill  . POLYETHYLENE GLYCOL 3350 PO POWD Oral Take 17 g by mouth daily. 255 g 0    BP 126/77  Pulse 79  Temp(Src) 97.6 F (36.4 C) (Oral)  Resp 18  Ht 6\' 1"  (1.854 m)  Wt 202 lb (91.627 kg)  BMI 26.65 kg/m2  SpO2 100%  Physical Exam  Nursing note and vitals reviewed. Constitutional: He  is oriented to person, place, and time. He appears well-developed and well-nourished.  HENT:  Head: Normocephalic and atraumatic.  Eyes: EOM are normal.  Neck: Normal range of motion.  Cardiovascular: Normal rate and regular rhythm.   Pulmonary/Chest: Effort normal and breath sounds normal. No respiratory distress.  Abdominal: Soft. He exhibits no distension. There is no tenderness. There is no rebound and no guarding.  Musculoskeletal: Normal range of motion.  Neurological: He is alert and oriented to person, place, and time.  Skin: Skin is warm and dry.  Psychiatric: He has a normal mood and affect. Judgment normal.    ED Course  Procedures (including critical care time)  Labs Reviewed  TESTOSTERONE - Abnormal; Notable for the following:    Testosterone 1424.98 (*)    All other components within normal limits  VALPROIC ACID LEVEL - Abnormal; Notable for the following:    Valproic Acid Lvl 43.2 (*)    All other components within normal limits  COMPREHENSIVE METABOLIC PANEL - Abnormal; Notable for the following:    Sodium 132 (*)    Chloride 94 (*)    Glucose, Bld 109 (*)  BUN 27 (*)    Creatinine, Ser 2.15 (*)    GFR calc non Af Amer 32 (*)    GFR calc Af Amer 37 (*)    All other components within normal limits  TSH  T4, FREE  T3, FREE  VALPROIC ACID LEVEL  LAB REPORT - SCANNED   No results found.   1. Bipolar disorder, unspecified   2. Bipolar 1 disorder, mixed   3. Constipation       MDM  Pt with constipation clinically. His vitals are normal. Recommended double dose miralax and enema's PRN to be completed at [facility]. No indication for completion in the ER. No n/v. No signs of obstruction        Lyanne Co, MD 08/27/11 2202

## 2011-08-27 NOTE — ED Notes (Signed)
Pt. Returned to [facility] via security

## 2011-08-27 NOTE — ED Notes (Signed)
Patient c/o decreased function in his "urinary tract and bowels". Patient has hx of depression and reports currently being treated for depression, but states he hasn't been able to get to sleep in 3-4 days. Patient also reports progressive decreased appetite and fluid intake x 3-4  days

## 2011-08-27 NOTE — Progress Notes (Signed)
Grief and Loss Group   Facilitated grief and loss group on [address] hall. Discussed group rules/norms and reviewed privacy. Discussed types of loss and grief reactions. Group was large and mostly quiet, required facilitator to used silence often, but members did share as group progressed. Members were supportive of one another and provided universality/validation. Main topic of grief/loss in current group was loss of children and loss of childhood.   Pt shared at beginning of group that he had battled cancer 2x in his life, but did not share further and was silent majority of group, though he was attentive to other members.  [name] B MS, LPCA, NCC

## 2011-08-27 NOTE — Telephone Encounter (Signed)
Dr Cleta Alberts  Pt is in the hospital for depression since Thursday night.  Wanted you to be informed.

## 2011-08-27 NOTE — ED Notes (Signed)
Patient's wife left contact information if needed as follows: Mieczyslaw Stamas (wife) [phone].

## 2011-08-27 NOTE — ED Provider Notes (Deleted)
6:32 AM The patient was sent from behavioral health for evaluation for no bowel movement in 3 days.  His abdomen is benign.  It is soft and nontender.  Rectal exam deferred there is no indication for imaging.  I will recommend double dose MiraLAX and high-fiber diet.  If need be they can do him as at the behavior health hospital.   Lyanne Co, MD 08/27/11 [phone]

## 2011-08-28 MED ORDER — PRAZOSIN HCL 2 MG PO CAPS
2.0000 mg | ORAL_CAPSULE | Freq: Every day | ORAL | Status: DC
  Administered 2011-08-28: 2 mg via ORAL
  Filled 2011-08-28 (×2): qty 1
  Filled 2011-08-28: qty 2
  Filled 2011-08-28: qty 1

## 2011-08-28 MED ORDER — POLYETHYLENE GLYCOL 3350 17 G PO PACK
17.0000 g | PACK | Freq: Every day | ORAL | Status: DC
  Administered 2011-08-28 – 2011-08-29 (×2): 17 g via ORAL
  Filled 2011-08-28 (×4): qty 1

## 2011-08-28 NOTE — Progress Notes (Signed)
[facility] Group Notes: (Counselor/Nursing/MHT/Case Management/Adjunct) 08/28/2011   @1 :15pm  Type of Therapy:  Group Therapy  Participation Level:  Active  Participation Quality:  Attentive  Affect:  Blunted  Cognitive:  Appropriate  Insight:  None  Engagement in Group: Limited  Engagement in Therapy:  Limited  Modes of Intervention:  Support and Exploration  Summary of Progress/Problems: [name] participated in lovingkindness/wellness meditation and breathing exercise, but did not process his experience with the group afterward.  Billie Lade 08/28/2011 2:21 PM

## 2011-08-28 NOTE — Progress Notes (Signed)
08/28/2011         Time: 1415      Group Topic/Focus: The focus of this group is on enhancing patients' problem solving skills, which involves identifying the problem, brainstorming solutions and choosing and trying a solution.   Participation Level: Active  Participation Quality: Redirectable  Affect: Anxious  Cognitive: Oriented  Additional Comments: Patient shaking, reports not sleeping well last night. Patient encouraged to just try his best during group, was receptive.   Cigi Bega 08/28/2011 3:08 PM

## 2011-08-28 NOTE — Progress Notes (Signed)
SW met with pt individually at this time.  Pt was resting in bed.  Pt presents with anxious mood and affect.  Pt states that he didn't sleep well last night.  SW commented on pt's shakiness in his hands and pt states this is from not sleeping.  Pt states that he was ready to d/c today yesterday, but now doesn't feel well today.  Pt states that he doesn't want to go home until he gets "a solid 8 hours of sleep".  Pt ranks depression and anxiety at a 5 today.  Pt denies SI.  Pt has follow up with Dr. Donell Beers.  No further needs voiced by pt at this time.   [name], LCSWA 08/28/2011  10:55 AM

## 2011-08-28 NOTE — Progress Notes (Signed)
[facility] Group Notes:  (Counselor/Nursing/MHT/Case Management/Adjunct)  08/28/2011 11:58 AM  Type of Therapy:  Group Therapy  Participation Level:  Did Not Attend    Luanna Cole 08/28/2011, 11:58 AM

## 2011-08-28 NOTE — Telephone Encounter (Signed)
Dr Cleta Alberts, Pts wife called regarding pts stay at behavioral health recently. He is due to be released this week and he and his wife are requesting a recommendation for a psychiatrist. Have seen dr Donell Beers and would like a 2nd opinion from another dr. Quincy Carnes can make their own appt, just need a recommendation of another dr. Please call: [phone]

## 2011-08-28 NOTE — Progress Notes (Signed)
Patient ID: [name], male   DOB: Oct 02, 1950, 61 y.o.   MRN: [id number] [facility] MD Progress Note  08/28/2011 9:36 AM  Diagnosis:  Axis I: Bipolar, Depressed  ADL's:  Intact  Sleep: Fair  Appetite:  Fair  Suicidal Ideation:  Denies adamantly any suicidal thoughts. Homicidal Ideation:  Denies adamantly any homicidal thoughts.  Mental Status Examination/Evaluation: Objective:  Appearance: Casual  Eye Contact::  Good  Speech:  Clear and Coherent  Volume:  Normal  Mood:  5 /10 on a scale of 1 is the best and 10 is the worst  Anxiety:  5 /10 on the same scale  Affect:  Congruent  Thought Process:  Coherent  Orientation:  Full  Thought Content:  WDL  Suicidal Thoughts:  No  Homicidal Thoughts:  No  Memory:  Immediate;   Good  Judgement:  Fair  Insight:  Fair  Psychomotor Activity:  Decreased  Concentration:  Fair  Recall:  Fair  Akathisia:  No  AIMS (if indicated):     Assets:  Communication Skills Desire for Improvement Financial Resources/Insurance Housing Intimacy Leisure Time Physical Health  Sleep:  Number of Hours: 6.5    Vital Signs:Blood pressure 132/86, pulse 88, temperature 97 F (36.1 C), temperature source Oral, resp. rate 18, height 6\' 1"  (1.854 m), weight 91.627 kg (202 lb), SpO2 100.00%. Current Medications: Current Facility-Administered Medications  Medication Dose Route Frequency Provider Last Rate Last Dose  . acetaminophen (TYLENOL) tablet 650 mg  650 mg Oral Q6H PRN Mike Craze, MD   650 mg at 08/27/11 0334  . ARIPiprazole (ABILIFY) tablet 15 mg  15 mg Oral QHS Curlene Labrum Readling, MD   15 mg at 08/27/11 2142  . bisacodyl (DULCOLAX) EC tablet 5 mg  5 mg Oral Daily PRN Sanjuana Kava, NP   5 mg at 08/26/11 0058  . calcium carbonate (TUMS - dosed in mg elemental calcium) chewable tablet 200 mg of elemental calcium  1 tablet Oral Q4H PRN Viviann Spare, NP      . divalproex (DEPAKOTE ER) 24 hr tablet 1,000 mg  1,000 mg Oral QHS Mike Craze, MD    1,000 mg at 08/27/11 2142  . divalproex (DEPAKOTE ER) 24 hr tablet 500 mg  500 mg Oral Daily Viviann Spare, NP   500 mg at 08/28/11 0817  . magnesium hydroxide (MILK OF MAGNESIA) suspension 30 mL  30 mL Oral Daily PRN Mike Craze, MD   30 mL at 08/26/11 0059  . mirtazapine (REMERON) tablet 7.5 mg  7.5 mg Oral QHS Mike Craze, MD   7.5 mg at 08/27/11 2141  . omega-3 acid ethyl esters (LOVAZA) capsule 1 g  1 g Oral Daily Mike Craze, MD   1 g at 08/28/11 0817  . prazosin (MINIPRESS) capsule 1 mg  1 mg Oral QHS PRN,MR X 1 Mike Craze, MD   1 mg at 08/27/11 0425  . prazosin (MINIPRESS) capsule 2 mg  2 mg Oral QHS Mike Craze, MD      . simvastatin (ZOCOR) tablet 20 mg  20 mg Oral q1800 Mike Craze, MD   20 mg at 08/27/11 2142  . zolpidem (AMBIEN) tablet 5 mg  5 mg Oral QHS PRN Viviann Spare, NP   5 mg at 08/27/11 0335  . DISCONTD: prazosin (MINIPRESS) capsule 1 mg  1 mg Oral QHS Mike Craze, MD   1 mg at 08/27/11 2018    Lab  Results:  No results found for this or any previous visit (from the past 48 hour(s)).  Physical Findings: AIMS:  , ,  ,  ,    CIWA:  CIWA-Ar Total: 5  COWS:     Treatment Plan Summary: Daily contact with patient to assess and evaluate symptoms and progress in treatment Medication management  Plan: His bowels are still not going that well.  Will order Miralax.  Depakote level was not ordered for yesterday, so go that ordered for this evening.  Will push first does of Minipress to 2 mg first dose with 2 prn's after that. Discussed his perception of not sleeping when he may actually be sleeping.   Agam Tuohy 08/28/2011, 9:36 AM

## 2011-08-28 NOTE — Progress Notes (Signed)
Pt has been up and has been active while in the milieu today, has participated in some milieu activities, pt has received all medications without incident, pt has endorsed feelings of depression, pt has stated that he is just not feeling well, support provided, will continue to monitor

## 2011-08-28 NOTE — Progress Notes (Signed)
Patient ID: [name], male   DOB: Apr 10, 1951, 61 y.o.   MRN: [id number]  Patient was pleasant and cooperative during the assessment.  Pt doesn't tend to engage the writer in conversation, but is extremely polite. Pt didn't express any questions or concerns when asked. Support and encouragement was offered.

## 2011-08-29 DIAGNOSIS — F3160 Bipolar disorder, current episode mixed, unspecified: Principal | ICD-10-CM

## 2011-08-29 MED ORDER — TESTOSTERONE ENANTHATE 200 MG/ML IM SOLN: 200.0000 mg | INTRAMUSCULAR | Status: DC

## 2011-08-29 MED ORDER — PRAZOSIN HCL 2 MG PO CAPS: 2.0000 mg | ORAL_CAPSULE | Freq: Every day | ORAL | Status: DC

## 2011-08-29 MED ORDER — FISH OIL 1200 MG PO CAPS: 1.0000 | ORAL_CAPSULE | Freq: Every day | ORAL | Status: DC

## 2011-08-29 MED ORDER — COQ10 100 MG PO CAPS: 1.0000 | ORAL_CAPSULE | Freq: Every day | ORAL | Status: DC

## 2011-08-29 MED ORDER — GLUCOSAMINE-CHONDROITIN 500-400 MG PO CAPS: 1.0000 | ORAL_CAPSULE | Freq: Two times a day (BID) | ORAL | Status: DC

## 2011-08-29 MED ORDER — ONE-DAILY MULTI VITAMINS PO TABS: 1.0000 | ORAL_TABLET | Freq: Every day | ORAL | Status: DC

## 2011-08-29 MED ORDER — ARIPIPRAZOLE 5 MG PO TABS: 15.0000 mg | ORAL_TABLET | Freq: Every day | ORAL | Status: DC

## 2011-08-29 MED ORDER — POLYETHYLENE GLYCOL 3350 17 G PO PACK: 17.0000 g | PACK | Freq: Every day | ORAL | Status: DC

## 2011-08-29 MED ORDER — PRAVASTATIN SODIUM 40 MG PO TABS: 40.0000 mg | ORAL_TABLET | Freq: Every day | ORAL | Status: DC

## 2011-08-29 MED ORDER — MIRTAZAPINE 30 MG PO TABS: ORAL_TABLET | ORAL | Status: DC

## 2011-08-29 MED ORDER — DIVALPROEX SODIUM ER 250 MG PO TB24: ORAL_TABLET | ORAL | Status: DC

## 2011-08-29 NOTE — [facility] Suicide Risk Assessment (Signed)
Suicide Risk Assessment  Discharge Assessment     Demographic factors:  Assessment Details Time of Assessment: Admission Information Obtained From: Patient Current Mental Status:  Current Mental Status: Suicidal ideation indicated by patient Risk Reduction Factors:  Risk Reduction Factors: Sense of responsibility to family  CLINICAL FACTORS:   Severe Anxiety and/or Agitation Bipolar Disorder:   Bipolar II Previous Psychiatric Diagnoses and Treatments Medical Diagnoses and Treatments/Surgeries  COGNITIVE FEATURES THAT CONTRIBUTE TO RISK:  Thought constriction (tunnel vision)    SUICIDE RISK:   Minimal: No identifiable suicidal ideation.  Patients presenting with no risk factors but with morbid ruminations; may be classified as minimal risk based on the severity of the depressive symptoms  ADL's:  Intact  Sleep: Fair  Appetite:  Good  Suicidal Ideation:  Denies adamantly any suicidal thoughts. Homicidal Ideation:  Denies adamantly any homicidal thoughts.  Mental Status Examination/Evaluation: Objective:  Appearance: Casual  Eye Contact::  Good  Speech:  Clear and Coherent  Volume:  Normal  Mood:  Euthymic  Affect:  Congruent  Thought Process:  Coherent  Orientation:  Full  Thought Content:  WDL  Suicidal Thoughts:  No  Homicidal Thoughts:  No  Memory:  Immediate;   Good  Judgement:  Good  Insight:  Good  Psychomotor Activity:  Normal  Concentration:  Good  Recall:  Good  Akathisia:  No  AIMS (if indicated):     Assets:  Communication Skills Desire for Improvement Financial Resources/Insurance Housing Intimacy Leisure Time Physical Health Social Support Talents/Skills Transportation  Sleep: Number of Hours: 5    Vital Signs: Blood pressure 117/72, pulse 86, temperature 97 F (36.1 C), temperature source Oral, resp. rate 18, height 6\' 1"  (1.854 m), weight 91.627 kg (202 lb), SpO2 100.00%.  Labs Results for orders placed during the hospital encounter of  08/22/11 (from the past 48 hour(s))  VALPROIC ACID LEVEL     Status: Abnormal   Collection Time   08/28/11  7:30 PM      Component Value Range Comment   Valproic Acid Lvl 113.0 (*) 50.0 - 100.0 (ug/mL)     What pt has learned from hospital stay is to think before you do something.  Risk of self harm is elevated by his diagnosis, but he has decided that he has himself, his family, and getting back to work to live for.  Risk of harm to others is minimal in that he has not been involved in fights or had any legal charges filed on him.  PLAN: Discharge home Continue Medication List  As of 08/29/2011  1:40 PM   STOP taking these medications         LORazepam 0.5 MG tablet         TAKE these medications         ARIPiprazole 5 MG tablet   Commonly known as: ABILIFY   Take 3 tablets (15 mg total) by mouth daily. For mood control.      CoQ10 100 MG Caps   Take 1 capsule by mouth daily. For nutritional supplementation.      divalproex 250 MG 24 hr tablet   Commonly known as: DEPAKOTE ER   Take by mouth 2 in AM and 4 at Bedtime.For mood control.      Fish Oil 1200 MG Caps   Take 1 capsule (1,200 mg total) by mouth daily. For depression and brain health      Glucosamine-Chondroitin 500-400 MG Caps   Take 1 tablet by mouth 2 (  two) times daily. For arthritic pain management      mirtazapine 30 MG tablet   Commonly known as: REMERON   Take by mouth ONE FOURTH tablet at bed time For insomnia.      multivitamin tablet   Take 1 tablet by mouth daily. For nutritional supplementation.      polyethylene glycol powder powder   Commonly known as: GLYCOLAX/MIRALAX   Take 17 g by mouth daily.      polyethylene glycol packet   Commonly known as: MIRALAX / GLYCOLAX   Take 17 g by mouth daily. For constipation      pravastatin 40 MG tablet   Commonly known as: PRAVACHOL   Take 1 tablet (40 mg total) by mouth daily. To help lower cholesterol.      prazosin 2 MG capsule   Commonly  known as: MINIPRESS   Take 1 capsule (2 mg total) by mouth at bedtime. For insomnia. May repeat, but be careful getting up in the middle of the night.  This may lower blood pressure and make you dizzy if you stand up quickly while it is      testosterone enanthate 200 MG/ML injection   Commonly known as: DELATESTRYL   Inject 1 mL (200 mg total) into the muscle every 14 (fourteen) days. Apply as directed.  Patient last received an injection on 08/13/11      zolpidem 10 MG tablet   Commonly known as: AMBIEN   Take 10 mg by mouth at bedtime as needed. For sleep           Shameka Aggarwal 08/29/2011, 1:39 PM

## 2011-08-29 NOTE — Progress Notes (Signed)
[facility] Case Management Discharge Plan:  Will you be returning to the same living situation after discharge: Yes,  return home with wife At discharge, do you have transportation home?:Yes,  wife to provide transportation Do you have the ability to pay for your medications:Yes,  access to meds   Release of information consent forms completed and in the chart;  Patient's signature needed at discharge.  Patient to Follow up at:  Follow-up Information    Follow up with [facility] - Dr. Donell Beers on 09/06/2011. (Appointment scheduled at 10:00 am)    Contact information:   [address] [place], [place] [address] [phone]      Follow up with DAUB, Stan Head, MD .         Patient denies SI/HI:   Yes,  pt denies SI/HI    Safety Planning and Suicide Prevention discussed:  Yes,  discussed with pt  Barrier to discharge identified:No.  Summary and Recommendations: Pt attended discharge planning group and actively participated.  Pt presents with calm mood and affect.  Pt reports feeling stable to d/c today.  Pt reports sleeping better last night, but still not the way he would like.  Pt states that he believes he would get better sleep at home.  Pt ranks depression and anxiety at a 2 today.  Pt denies SI.  No recommendations from SW.  No further needs voiced by pt.  Pt stable to discharge.     Carmina Miller 08/29/2011, 10:21 AM

## 2011-08-29 NOTE — Progress Notes (Signed)
[facility] Group Notes: (Counselor/Nursing/MHT/Case Management/Adjunct) 08/29/2011   @  11:00am   Type of Therapy:  Group Therapy  Participation Level:  None  Participation Quality:  Attentive  Affect:  Blunted  Cognitive:  Appropriate  Insight:  None  Engagement in Group: None   Engagement in Therapy:  None  Modes of Intervention:  Support and Exploration  Summary of Progress/Problems:  [name] seemed attentive but he did not personally engage.   Billie Lade 08/29/2011 11:40am     [facility] Group Notes: (Counselor/Nursing/MHT/Case Management/Adjunct) 08/29/2011   @1 :15pm  Type of Therapy:  Group Therapy  Participation Level:  None  Participation Quality:  None  Affect:  Blunted  Cognitive:  Appropriate  Insight:  None  Engagement in Group: None  Engagement in Therapy:  None  Modes of Intervention:  Support and Exploration  Summary of Progress/Problems:  [name]  was attentive but not engaged in group process    Billie Lade 08/29/2011 3:18 PM

## 2011-08-29 NOTE — Discharge Summary (Signed)
I agree with this D/C Summary.  

## 2011-08-29 NOTE — Telephone Encounter (Signed)
I spoke with patient's wife. He had trouble with constipation we'll treat that with MiraLax. Apparently his testosterone level was high when he was in the hospital we'll need to check a level on Tuesday

## 2011-08-29 NOTE — Tx Team (Signed)
Interdisciplinary Treatment Plan Update (Adult)  Date:  08/29/2011  Time Reviewed:  10:40 AM   Progress in Treatment: Attending groups: Yes Participating in groups:  Yes Taking medication as prescribed: Yes Tolerating medication:  Yes Family/Significant othe contact made:  Yes - contact made with wife Patient understands diagnosis:  Yes Discussing patient identified problems/goals with staff:  Yes Medical problems stabilized or resolved:  Yes Denies suicidal/homicidal ideation: Yes Issues/concerns per patient self-inventory:  None identified Other: N/A  New problem(s) identified: None Identified  Reason for Continuation of Hospitalization: Stable to d/c  Interventions implemented related to continuation of hospitalization: Stable to d/c  Additional comments: N/A  Estimated length of stay: D/C today  Discharge Plan: Pt will follow up with Dr. Donell Beers for medication management.  New goal(s): N/A  Review of initial/current patient goals per problem list:    1.  Goal(s): Reduce depressive symptoms  Met:  Yes  Target date: by discharge  As evidenced by: Reducing depression from a 10 to a 3 as reported by pt.  Pt ranks at a 2 today.  2.  Goal (s): Reduce/Eliminate suicidal ideation  Met:  Yes  Target date: by discharge  As evidenced by: pt reporting no SI.    3.  Goal(s): Reduce anxiety symptoms  Met:  Yes  Target date: by discharge  As evidenced by: Reduce anxiety from a 10 to a 3 as reported by pt. Pt ranks at a 2 today.    Attendees: Patient:  [name] 08/29/2011 10:41 AM   Family:     Physician:  Orson Aloe, MD  08/29/2011  10:40 AM   Nursing:   Quintella Reichert, RN 08/29/2011 10:42 AM   Case Manager:  Reyes Ivan, LCSWA 08/29/2011  10:40 AM   Counselor:  Angus Palms, LCSW 08/29/2011  10:40 AM   Other:  Juline Patch, LCSW 08/29/2011  10:40 AM      Other:     Other:      Scribe for Treatment Team:   Carmina Miller, 08/29/2011 , 10:40 AM

## 2011-08-29 NOTE — Progress Notes (Signed)
Pt discharged this afternoon to wife, pt denies any suicidal thoughts, pt provided with discharge information and pt verbalized understanding, pt provided with prescriptions and all belongings returned

## 2011-08-29 NOTE — Discharge Summary (Signed)
Physician Discharge Summary Note  Patient:  [name] is an 61 y.o., male MRN:  [id number] DOB:  10/27/1950 Patient phone:  [phone] (home)  Patient address:   [address] [place] [address],   Date of Admission:  08/22/2011 Date of Discharge: 08/29/11  Reason for Admission: Expression of suicidal thoughts x 2 weeks Discharge Diagnoses: Principal Problem:  *Bipolar 1 disorder, mixed Active Problems:  Renal insufficiency   Axis Diagnosis:   AXIS I:  Bipolar disorder, mixed AXIS II:  Deferred AXIS III:   Past Medical History  Diagnosis Date  . Renal cell carcinoma     chemo and xrt--lost rt kidney  . Childhood asthma   . Sinusitis   . Cellulitis     after left foot surgery  . Osteoarthritis   . OSA (obstructive sleep apnea)     NPSG 05-08-07 AHI 63.1/hr,desat t0 80%/loud snoring  . Bipolar 1 disorder   . Hyperlipemia   . Mental disorder    AXIS IV:  other psychosocial or environmental problems AXIS V:  70  Level of Care:  OP  Hospital Course:  [facility] H. admission for [name] who was brought to the hospital by his wife, who was concerned that he been expressing suicidal thoughts for about 2 weeks. He has a history of bipolar disorder and was previously stabilized on lithium. Most recently has been started on Depakote. Lithium was stopped in June of 2012 due to renal insufficiency after nephrectomy for kidney cancer.  Since June he had an episode of mood elevation, impulsivity, resulting in some financial stressors for the family. For the past 2 weeks has been going through this depressive phase. Sleep has been poor. He reports he's had some suicidal thoughts and possibly overdosing on pills. Denies he has had any intent for suicide. Concentration is poor, and his work performance has been poor.  While a patient in this hospital, patient received medication managements as well as group counseling. He also received medication management and monitoring for other  medical conditions. He did well on his medications without any adverse effects reported. Patient participated actively in group counseling and reports on daily basis decreased suicidal ideation and improved mood. Patient met with the treatment team this am and agreed with the team that he is stable for discharge. To maintain stability, patient will continue psychiatric care on an outpatient basis at the [facility] with Dr. Donell Beers on 09/06/11 and with Dr. Cleta Alberts. The addresses, dates and times for these appointments and other important appointments provided for patient.  Patient left [facility] with all personal belongings via family transport in no apparent distress.   What pt has learned from hospital stay is to think before you do something  Consults: None.   Significant Diagnostic Studies:  None  Discharge Vitals:   Blood pressure 117/72, pulse 86, temperature 97 F (36.1 C), temperature source Oral, resp. rate 18, height 6\' 1"  (1.854 m), weight 91.627 kg (202 lb), SpO2 100.00%.  Mental Status Exam: See Mental Status Examination and Suicide Risk Assessment completed by Attending Physician prior to discharge.  Discharge destination:  Home  Is patient on multiple antipsychotic therapies at discharge:  No   Has Patient had three or more failed trials of antipsychotic monotherapy by history:  No  Recommended Plan for Multiple Antipsychotic Therapies: NA  Discharge Orders    Future Appointments: Provider: Department: Dept Phone: Center:   11/12/2011 8:00 AM Collene Gobble, MD [facility] [phone]  [facility]   04/10/2012 3:45 PM Waymon Budge, MD [facility] [phone] None     Medication List  As of 08/29/2011  3:27 PM   STOP taking these medications         LORazepam 0.5 MG tablet         TAKE these medications      Indication    ARIPiprazole 5 MG tablet   Commonly known as: ABILIFY   Take 3 tablets (15 mg total) by mouth daily. For mood control.        CoQ10 100 MG Caps   Take 1 capsule by mouth daily. For nutritional supplementation.       divalproex 250 MG 24 hr tablet   Commonly known as: DEPAKOTE ER   Take by mouth 2 in AM and 4 at Bedtime.For mood control.       Fish Oil 1200 MG Caps   Take 1 capsule (1,200 mg total) by mouth daily. For depression and brain health       Glucosamine-Chondroitin 500-400 MG Caps   Take 1 tablet by mouth 2 (two) times daily. For arthritic pain management       mirtazapine 30 MG tablet   Commonly known as: REMERON   Take by mouth ONE FOURTH tablet at bed time For insomnia.       multivitamin tablet   Take 1 tablet by mouth daily. For nutritional supplementation.       polyethylene glycol powder powder   Commonly known as: GLYCOLAX/MIRALAX   Take 17 g by mouth daily.       polyethylene glycol packet   Commonly known as: MIRALAX / GLYCOLAX   Take 17 g by mouth daily. For constipation       pravastatin 40 MG tablet   Commonly known as: PRAVACHOL   Take 1 tablet (40 mg total) by mouth daily. To help lower cholesterol.       prazosin 2 MG capsule   Commonly known as: MINIPRESS   Take 1 capsule (2 mg total) by mouth at bedtime. For insomnia. May repeat, but be careful getting up in the middle of the night.  This may lower blood pressure and make you dizzy if you stand up quickly while it is       testosterone enanthate 200 MG/ML injection   Commonly known as: DELATESTRYL   Inject 1 mL (200 mg total) into the muscle every 14 (fourteen) days. Apply as directed.  Patient last received an injection on 08/13/11       zolpidem 10 MG tablet   Commonly known as: AMBIEN   Take 10 mg by mouth at bedtime as needed. For sleep            Follow-up Information    Follow up with [facility] - Dr. Donell Beers on 09/06/2011. (Appointment scheduled at 10:00 am)    Contact information:   [address] [place], [place] [address] [phone]      Follow up with DAUB, Stan Head, MD .          Follow-up recommendations:  Other:  keep all scheduled follow-up appointments as recommended.  Comments:  Take all your discharged medications as prescribed.                       Report to your outpatient provider any adverse effects from medications promptly.  SignedArmandina Stammer I 08/29/2011, 3:27 PM

## 2011-08-29 NOTE — Progress Notes (Signed)
Patient ID: [name], male   DOB: June 30, 1950, 61 y.o.   MRN: [id number] Pt was more anxious today than previous days. "I feel like if I can get some sleep, I'll get out of here tomorrow."  Pt asked the writer about his sleep meds and was informed of all of them. Support and encouragement was offered.

## 2011-08-31 ENCOUNTER — Emergency Department (HOSPITAL_COMMUNITY)
Admission: EM | Admit: 2011-08-31 | Discharge: 2011-09-01 | Disposition: A | Discharge status: Other Acute Inpatient Hospital | Payer: 59 | Attending: Emergency Medicine | Admitting: Emergency Medicine

## 2011-08-31 ENCOUNTER — Ambulatory Visit: Payer: 59

## 2011-08-31 ENCOUNTER — Ambulatory Visit (INDEPENDENT_AMBULATORY_CARE_PROVIDER_SITE_OTHER): Payer: 59 | Admitting: Emergency Medicine

## 2011-08-31 ENCOUNTER — Other Ambulatory Visit: Payer: Self-pay | Admitting: Emergency Medicine

## 2011-08-31 ENCOUNTER — Encounter (HOSPITAL_COMMUNITY): Payer: Self-pay | Admitting: Adult Health

## 2011-08-31 VITALS — BP 153/92 | HR 76 | Temp 97.3°F | Resp 16 | Ht 73.0 in | Wt 204.0 lb

## 2011-08-31 DIAGNOSIS — E291 Testicular hypofunction: Secondary | ICD-10-CM | POA: Insufficient documentation

## 2011-08-31 DIAGNOSIS — F3130 Bipolar disorder, current episode depressed, mild or moderate severity, unspecified: Secondary | ICD-10-CM

## 2011-08-31 DIAGNOSIS — K5900 Constipation, unspecified: Secondary | ICD-10-CM

## 2011-08-31 DIAGNOSIS — F329 Major depressive disorder, single episode, unspecified: Secondary | ICD-10-CM | POA: Insufficient documentation

## 2011-08-31 DIAGNOSIS — F3289 Other specified depressive episodes: Secondary | ICD-10-CM | POA: Insufficient documentation

## 2011-08-31 DIAGNOSIS — F319 Bipolar disorder, unspecified: Secondary | ICD-10-CM | POA: Insufficient documentation

## 2011-08-31 LAB — POCT CBC
MCH, POC: 31.4 pg — AB (ref 27–31.2)
MID (cbc): 0.4 (ref 0–0.9)
MPV: 7.9 fL (ref 0–99.8)
POC Granulocyte: 2.4 (ref 2–6.9)
POC MID %: 12.1 %M — AB (ref 0–12)
Platelet Count, POC: 118 10*3/uL — AB (ref 142–424)
RBC: 5.12 M/uL (ref 4.69–6.13)
WBC: 3.6 10*3/uL — AB (ref 4.6–10.2)

## 2011-08-31 LAB — CBC
Hemoglobin: 17.8 g/dL — ABNORMAL HIGH (ref 13.0–17.0)
RBC: 5.39 MIL/uL (ref 4.22–5.81)

## 2011-08-31 LAB — RAPID URINE DRUG SCREEN, HOSP PERFORMED
Amphetamines: NOT DETECTED
Cocaine: NOT DETECTED
Opiates: NOT DETECTED
Tetrahydrocannabinol: NOT DETECTED

## 2011-08-31 LAB — COMPREHENSIVE METABOLIC PANEL
ALT: 51 U/L (ref 0–53)
AST: 45 U/L — ABNORMAL HIGH (ref 0–37)
Albumin: 4.5 g/dL (ref 3.5–5.2)
Alkaline Phosphatase: 62 U/L (ref 39–117)
Alkaline Phosphatase: 67 U/L (ref 39–117)
BUN: 28 mg/dL — ABNORMAL HIGH (ref 6–23)
CO2: 31 mEq/L (ref 19–32)
Calcium: 9.4 mg/dL (ref 8.4–10.5)
Calcium: 9.6 mg/dL (ref 8.4–10.5)
Chloride: 101 mEq/L (ref 96–112)
GFR calc non Af Amer: 31 mL/min — ABNORMAL LOW (ref >90)
Glucose, Bld: 90 mg/dL (ref 70–99)
Potassium: 5.2 mEq/L (ref 3.5–5.3)
Potassium: 5.1 mEq/L (ref 3.5–5.1)
Sodium: 136 mEq/L (ref 135–145)

## 2011-08-31 MED ORDER — ARIPIPRAZOLE 15 MG PO TABS
15.0000 mg | ORAL_TABLET | Freq: Every day | ORAL | Status: DC
  Administered 2011-08-31: 15 mg via ORAL
  Filled 2011-08-31 (×2): qty 1

## 2011-08-31 MED ORDER — TESTOSTERONE ENANTHATE 200 MG/ML IM SOLN: 200.0000 mg | INTRAMUSCULAR | Status: DC

## 2011-08-31 MED ORDER — DIVALPROEX SODIUM ER 500 MG PO TB24: 500.0000 mg | ORAL_TABLET | Freq: Every morning | ORAL | Status: DC

## 2011-08-31 MED ORDER — COQ10 100 MG PO CAPS: 1.0000 | ORAL_CAPSULE | Freq: Every day | ORAL | Status: DC

## 2011-08-31 MED ORDER — DIVALPROEX SODIUM ER 250 MG PO TB24: 250.0000 mg | ORAL_TABLET | Freq: Every day | ORAL | Status: DC

## 2011-08-31 MED ORDER — SODIUM CHLORIDE 0.9 % IV BOLUS (SEPSIS)
1000.0000 mL | Freq: Once | INTRAVENOUS | Status: AC
  Administered 2011-08-31: 1000 mL via INTRAVENOUS

## 2011-08-31 MED ORDER — MAGNESIUM CITRATE PO SOLN
1.0000 | Freq: Once | ORAL | Status: AC
  Administered 2011-08-31: 1 via ORAL
  Filled 2011-08-31: qty 296

## 2011-08-31 MED ORDER — POLYETHYLENE GLYCOL 3350 17 G PO PACK
17.0000 g | PACK | Freq: Every day | ORAL | Status: DC
  Administered 2011-08-31: 17 g via ORAL
  Filled 2011-08-31 (×3): qty 1

## 2011-08-31 MED ORDER — ZOLPIDEM TARTRATE 5 MG PO TABS: 10.0000 mg | ORAL_TABLET | Freq: Every evening | ORAL | Status: DC | PRN

## 2011-08-31 MED ORDER — ACETAMINOPHEN 325 MG PO TABS: 650.0000 mg | ORAL_TABLET | ORAL | Status: DC | PRN

## 2011-08-31 MED ORDER — LORAZEPAM 1 MG PO TABS: 1.0000 mg | ORAL_TABLET | Freq: Three times a day (TID) | ORAL | Status: DC | PRN

## 2011-08-31 MED ORDER — DIVALPROEX SODIUM ER 500 MG PO TB24: 500.0000 mg | ORAL_TABLET | Freq: Two times a day (BID) | ORAL | Status: DC

## 2011-08-31 MED ORDER — DIVALPROEX SODIUM ER 500 MG PO TB24
500.0000 mg | ORAL_TABLET | Freq: Every morning | ORAL | Status: DC
  Filled 2011-08-31: qty 1

## 2011-08-31 MED ORDER — MIRTAZAPINE 30 MG PO TABS
30.0000 mg | ORAL_TABLET | Freq: Every day | ORAL | Status: DC
  Administered 2011-08-31: 30 mg via ORAL
  Filled 2011-08-31: qty 1

## 2011-08-31 MED ORDER — OMEGA-3-ACID ETHYL ESTERS 1 G PO CAPS
1.0000 g | ORAL_CAPSULE | Freq: Every day | ORAL | Status: DC
  Administered 2011-08-31: 1 g via ORAL
  Filled 2011-08-31 (×2): qty 1

## 2011-08-31 MED ORDER — ZOLPIDEM TARTRATE 5 MG PO TABS: 5.0000 mg | ORAL_TABLET | Freq: Every evening | ORAL | Status: DC | PRN

## 2011-08-31 MED ORDER — DIVALPROEX SODIUM ER 500 MG PO TB24
1000.0000 mg | ORAL_TABLET | Freq: Every day | ORAL | Status: DC
  Administered 2011-08-31: 1000 mg via ORAL
  Filled 2011-08-31 (×3): qty 2

## 2011-08-31 MED ORDER — PRAZOSIN HCL 2 MG PO CAPS
2.0000 mg | ORAL_CAPSULE | Freq: Every day | ORAL | Status: DC
  Administered 2011-08-31: 2 mg via ORAL
  Filled 2011-08-31 (×2): qty 1

## 2011-08-31 MED ORDER — ADULT MULTIVITAMIN W/MINERALS CH
1.0000 | ORAL_TABLET | Freq: Every day | ORAL | Status: DC
  Administered 2011-08-31: 1 via ORAL
  Filled 2011-08-31: qty 1

## 2011-08-31 MED ORDER — IBUPROFEN 200 MG PO TABS: 600.0000 mg | ORAL_TABLET | Freq: Three times a day (TID) | ORAL | Status: DC | PRN

## 2011-08-31 NOTE — BH Assessment (Signed)
Assessment Note   [name] is an 61 y.o. male who presents voluntarily at [facility] with wife at insistence of Dr. Lesle Chris saw him this afternoon. Daub stated that pt was very depressed and might need med change. Pt was released from [facility] on 08/29/11 after 7 days for bipolar d/o and SI. Pt is unable to answer simple questions. It takes pt several seconds before he is able to answer writer's questions. His mood is depressed. Endorses fatigue, worthlessness, anhedonia, insomnia, despair. He endorses severe anxiety. Pt has hand and arm tremors and depressed affect. Wife states he is unable to complete ADLs. No substance abuse history. He denies SI and HI. No delusions noted. Pt states he feels his "body is shutting down on me". His wife explains that pt has been on Lithium since his early 64s and med worked well until 6 mos ago when he began to have kidney issues. Since that time he's been on Depakote. Pt states he is severely conspitated and therefore doesn't want to eat. Pt requests inpatient treatment and medication change.   According to [facility] assessment on 37/13: Pt was taken off his Lithium in July 2012 secondary to renal concerns. Pt had one kidney removed due to cancer and has had decreased function in remaining kidney. Pt stable on Depakote for 3 months. Became manic for 3 months and created financial burdens. Three weeks ago pt became depressed and began having SI 2 weeks ago.    Axis I: Bipolar, Depressed Axis II: Deferred Axis III:  Past Medical History  Diagnosis Date  . Renal cell carcinoma     chemo and xrt--lost rt kidney  . Childhood asthma   . Sinusitis   . Cellulitis     after left foot surgery  . Osteoarthritis   . OSA (obstructive sleep apnea)     NPSG 05-08-07 AHI 63.1/hr,desat t0 80%/loud snoring  . Bipolar 1 disorder   . Hyperlipemia   . Mental disorder    Axis IV: occupational problems and other psychosocial or environmental problems Axis V: 31-40 impairment in reality  testing  Past Medical History:  Past Medical History  Diagnosis Date  . Renal cell carcinoma     chemo and xrt--lost rt kidney  . Childhood asthma   . Sinusitis   . Cellulitis     after left foot surgery  . Osteoarthritis   . OSA (obstructive sleep apnea)     NPSG 05-08-07 AHI 63.1/hr,desat t0 80%/loud snoring  . Bipolar 1 disorder   . Hyperlipemia   . Mental disorder     Past Surgical History  Procedure Date  . Podiatric surgery     left foot toes,   . Bunionectomy     Family History:  Family History  Problem Relation Age of Onset  . Hypertension    . Sleep apnea    . Emphysema    . Prostate cancer      Social History:  reports that he has never smoked. He does not have any smokeless tobacco history on file. He reports that he drinks alcohol. He reports that he does not use illicit drugs.  Additional Social History:  Alcohol / Drug Use Pain Medications: no Prescriptions: using as directed Over the Counter: no History of alcohol / drug use?: No history of alcohol / drug abuse Longest period of sobriety (when/how long): no hx of abuse - drinks alcohol few times a year Allergies: No Known Allergies  Home Medications:  Medications Prior to Admission  Medication Dose Route Frequency Provider Last Rate Last Dose  . acetaminophen (TYLENOL) tablet 650 mg  650 mg Oral Q4H PRN Celene Kras, MD      . ARIPiprazole (ABILIFY) tablet 15 mg  15 mg Oral Daily Celene Kras, MD   15 mg at 08/31/11 1932  . divalproex (DEPAKOTE ER) 24 hr tablet 1,000 mg  1,000 mg Oral QHS Suzi Roots, MD      . divalproex (DEPAKOTE ER) 24 hr tablet 500 mg  500 mg Oral q morning - 10a Suzi Roots, MD      . ibuprofen (ADVIL,MOTRIN) tablet 600 mg  600 mg Oral Q8H PRN Celene Kras, MD      . LORazepam (ATIVAN) tablet 1 mg  1 mg Oral Q8H PRN Celene Kras, MD      . magnesium citrate solution 1 Bottle  1 Bottle Oral Once Celene Kras, MD   1 Bottle at 08/31/11 1353  . mirtazapine (REMERON) tablet 30  mg  30 mg Oral QHS Celene Kras, MD      . mulitivitamin with minerals tablet 1 tablet  1 tablet Oral Daily Celene Kras, MD   1 tablet at 08/31/11 1816  . omega-3 acid ethyl esters (LOVAZA) capsule 1 g  1 g Oral Daily Celene Kras, MD   1 g at 08/31/11 1932  . polyethylene glycol (MIRALAX / GLYCOLAX) packet 17 g  17 g Oral Daily Celene Kras, MD   17 g at 08/31/11 1817  . prazosin (MINIPRESS) capsule 2 mg  2 mg Oral QHS Celene Kras, MD      . sodium chloride 0.9 % bolus 1,000 mL  1,000 mL Intravenous Once Celene Kras, MD   1,000 mL at 08/31/11 1405  . testosterone enanthate (DELATESTRYL) injection 200 mg  200 mg Intramuscular Q14 Days Celene Kras, MD      . zolpidem [facility]) tablet 10 mg  10 mg Oral QHS PRN Celene Kras, MD      . DISCONTD: CoQ10 CAPS 1 capsule  1 capsule Oral Daily Celene Kras, MD      . DISCONTD: divalproex (DEPAKOTE ER) 24 hr tablet 250 mg  250 mg Oral Daily Celene Kras, MD      . DISCONTD: divalproex (DEPAKOTE ER) 24 hr tablet 500 mg  500 mg Oral q morning - 10a Suzi Roots, MD      . DISCONTD: divalproex (DEPAKOTE ER) 24 hr tablet 500-1,000 mg  500-1,000 mg Oral BID Celene Kras, MD      . DISCONTD: testosterone enanthate (DELATESTRYL) injection 200 mg  200 mg Intramuscular Q14 Days Mike Craze, MD      . DISCONTD: testosterone enanthate (DELATESTRYL) injection 200 mg  200 mg Intramuscular Q14 Days Celene Kras, MD      . DISCONTD: zolpidem (AMBIEN) tablet 5 mg  5 mg Oral QHS PRN Celene Kras, MD       Medications Prior to Admission  Medication Sig Dispense Refill  . ARIPiprazole (ABILIFY) 5 MG tablet Take 3 tablets (15 mg total) by mouth daily. For mood control.  1 tablet  0  . Coenzyme Q10 (COQ10) 100 MG CAPS Take 1 capsule by mouth daily. For nutritional supplementation.  30 each  0  . divalproex (DEPAKOTE ER) 250 MG 24 hr tablet Take 500-1,000 mg by mouth 2 (two) times daily. Take by mouth 2 in AM and 4 at  Bedtime.For mood control.      . mirtazapine (REMERON) 30 MG  tablet Take by mouth ONE FOURTH tablet at bed time For insomnia.  1 tablet  0  . Multiple Vitamin (MULTIVITAMIN) tablet Take 1 tablet by mouth daily. For nutritional supplementation.  30 tablet  0  . Omega-3 Fatty Acids (FISH OIL) 1200 MG CAPS Take 1 capsule (1,200 mg total) by mouth daily. For depression and brain health  30 capsule  0  . polyethylene glycol (MIRALAX / GLYCOLAX) packet Take 17 g by mouth daily. For constipation  28 each  0  . pravastatin (PRAVACHOL) 40 MG tablet Take 1 tablet (40 mg total) by mouth daily. To help lower cholesterol.  30 tablet  0  . prazosin (MINIPRESS) 2 MG capsule Take 1 capsule (2 mg total) by mouth at bedtime. For insomnia. May repeat, but be careful getting up in the middle of the night.  This may lower blood pressure and make you dizzy if you stand up quickly while it is  40 capsule  0  . testosterone enanthate (DELATESTRYL) 200 MG/ML injection Inject 1 mL (200 mg total) into the muscle every 14 (fourteen) days. Apply as directed.  Patient last received an injection on 08/13/11  5 mL  0  . zolpidem (AMBIEN) 10 MG tablet Take 10 mg by mouth at bedtime as needed. For sleep      . polyethylene glycol powder (MIRALAX) powder Take 17 g by mouth daily.  255 g  0    OB/GYN Status:  No LMP for male patient.  General Assessment Data Location of Assessment: [facility] ED Living Arrangements: Spouse/significant other Can pt return to current living arrangement?: Yes Admission Status: Voluntary Is patient capable of signing voluntary admission?: Yes Transfer from:  (dr's office) Referral Source: MD  Education Status Is patient currently in school?: No Current Grade: na Highest grade of school patient has completed: college degree Name of school: [employer]  Risk to self Suicidal Ideation: No Suicidal Intent: No Is patient at risk for suicide?: No Suicidal Plan?: No Specify Current Suicidal Plan: na Access to Means: No Specify Access to Suicidal Means: na What has  been your use of drugs/alcohol within the last 12 months?: sporadic use of alcohol Previous Attempts/Gestures: Yes How many times?: 2  Other Self Harm Risks: none Triggers for Past Attempts: Other (Comment) Intentional Self Injurious Behavior: None Family Suicide History: No Recent stressful life event(s): Other (Comment) (unable to work bc depression) Persecutory voices/beliefs?: Yes Depression: Yes Depression Symptoms: Despondent;Insomnia;Guilt;Loss of interest in usual pleasures;Feeling worthless/self pity;Fatigue Substance abuse history and/or treatment for substance abuse?: No Suicide prevention information given to non-admitted patients: Not applicable  Risk to Others Homicidal Ideation: No Thoughts of Harm to Others: No Current Homicidal Intent: No Current Homicidal Plan: No Access to Homicidal Means: No Identified Victim: na History of harm to others?: No Assessment of Violence: None Noted Violent Behavior Description: na Does patient have access to weapons?: No Criminal Charges Pending?: No Does patient have a court date: No  Psychosis Hallucinations: None noted Delusions: None noted  Mental Status Report Appear/Hygiene: Disheveled Eye Contact: Fair Motor Activity: Tremors;Freedom of movement Speech: Logical/coherent Level of Consciousness: Alert Mood: Depressed;Sad Affect: Depressed Anxiety Level: Severe Thought Processes: Coherent;Relevant Judgement: Impaired Orientation: Person;Place;Time;Situation Obsessive Compulsive Thoughts/Behaviors: None  Cognitive Functioning Concentration: Decreased Memory: Remote Impaired;Recent Intact IQ: Average Insight: Fair Impulse Control: Fair Appetite: Poor Weight Loss: 0  Weight Gain: 0  Sleep: Decreased Total Hours of Sleep: 4  Vegetative Symptoms: None  Prior Inpatient Therapy Prior Inpatient Therapy: Yes Prior Therapy Dates: [facility] - released 5 days ago and 8 yrs ago Prior Therapy Facilty/Provider(s): [facility] Reason for Treatment: Bipolar Depression SI  Prior Outpatient Therapy Prior Outpatient Therapy: Yes Prior Therapy Dates: current Prior Therapy Facilty/Provider(s): G Plovsky MD Reason for Treatment: Bipolar SI  ADL Screening (condition at time of admission) Patient's cognitive ability adequate to safely complete daily activities?: Yes Patient able to express need for assistance with ADLs?: Yes Independently performs ADLs?: Yes Weakness of Legs: None Weakness of Arms/Hands: None  Home Assistive Devices/Equipment Home Assistive Devices/Equipment: Other (Comment) (cpap)    Abuse/Neglect Assessment (Assessment to be complete while patient is alone) Physical Abuse: Denies Verbal Abuse: Denies Sexual Abuse: Denies Exploitation of patient/patient's resources: Denies Self-Neglect: Denies Values / Beliefs Cultural Requests During Hospitalization: None Spiritual Requests During Hospitalization: None   Advance Directives (For Healthcare) Advance Directive: Patient does not have advance directive;Patient would not like information    Additional Information 1:1 In Past 12 Months?: No CIRT Risk: No Elopement Risk: No Does patient have medical clearance?: Yes     Disposition:  Disposition Disposition of Patient: Inpatient treatment program;Other dispositions Type of inpatient treatment program: Adult  On Site Evaluation by:   Reviewed with Physician:     Donnamarie Rossetti P 08/31/2011 8:42 PM

## 2011-08-31 NOTE — ED Notes (Signed)
PT BELONGINGS: Daughter Bonita Quin took all belonging home

## 2011-08-31 NOTE — ED Notes (Signed)
Pt states that he is here to be "admitted to behavioral health".  "I think I need a change in medications.  I can't sleep at all."  Denies SI/HI.  Only feeling depressed.  C/o constipation.  Last BM one week ago.

## 2011-08-31 NOTE — Progress Notes (Signed)
PHARMACIST - PHYSICIAN ORDER COMMUNICATION  CONCERNING: P&T Medication Policy on Herbal Medications  DESCRIPTION:  This patient's order for:  Coenzyme Q10  has been noted.  This product(s) is classified as an "herbal" or natural product. Due to a lack of definitive safety studies or FDA approval, nonstandard manufacturing practices, plus the potential risk of unknown drug-drug interactions while on inpatient medications, the Pharmacy and Therapeutics Committee does not permit the use of "herbal" or natural products of this type within [facility]   ACTION TAKEN: The pharmacy department is unable to verify this order at this time and your patient has been informed of this safety policy. Please reevaluate patient's clinical condition at discharge and address if the herbal or natural product(s) should be resumed at that time.  Charolotte Eke, PharmD, pager [phone]. 08/31/2011,5:35 PM.

## 2011-08-31 NOTE — ED Notes (Signed)
Family member: [phone]

## 2011-08-31 NOTE — Patient Instructions (Addendum)
I placed a phone call to call behavioral health and spoke with Tom. The suggestion was to be referred over to the ER was in the hospital where he will be seen by either Maretta Bees or Weber Cooks for evaluationConstipation in Adults Constipation is having fewer than 2 bowel movements per week. Usually, the stools are hard. As we grow older, constipation is more common. If you try to fix constipation with laxatives, the problem may get worse. This is because laxatives taken over a long period of time make the colon muscles weaker. A low-fiber diet, not taking in enough fluids, and taking some medicines may make these problems worse. MEDICATIONS THAT MAY CAUSE CONSTIPATION  Water pills (diuretics).   Calcium channel blockers (used to control blood pressure and for the heart).   Certain pain medicines (narcotics).   Anticholinergics.   Anti-inflammatory agents.   Antacids that contain aluminum.  DISEASES THAT CONTRIBUTE TO CONSTIPATION  Diabetes.   Parkinson's disease.   Dementia.   Stroke.   Depression.   Illnesses that cause problems with salt and water metabolism.  HOME CARE INSTRUCTIONS   Constipation is usually best cared for without medicines. Increasing dietary fiber and eating more fruits and vegetables is the best way to manage constipation.   Slowly increase fiber intake to 25 to 38 grams per day. Whole grains, fruits, vegetables, and legumes are good sources of fiber. A dietitian can further help you incorporate high-fiber foods into your diet.   Drink enough water and fluids to keep your urine clear or pale yellow.   A fiber supplement may be added to your diet if you cannot get enough fiber from foods.   Increasing your activities also helps improve regularity.   Suppositories, as suggested by your caregiver, will also help. If you are using antacids, such as aluminum or calcium containing products, it will be helpful to switch to products containing magnesium if  your caregiver says it is okay.   If you have been given a liquid injection (enema) today, this is only a temporary measure. It should not be relied on for treatment of longstanding (chronic) constipation.   Stronger measures, such as magnesium sulfate, should be avoided if possible. This may cause uncontrollable diarrhea. Using magnesium sulfate may not allow you time to make it to the bathroom.  SEEK IMMEDIATE MEDICAL CARE IF:   There is bright red blood in the stool.   The constipation stays for more than 4 days.   There is belly (abdominal) or rectal pain.   You do not seem to be getting better.   You have any questions or concerns.  MAKE SURE YOU:   Understand these instructions.   Will watch your condition.   Will get help right away if you are not doing well or get worse.  Document Released: 03/01/2004 Document Revised: 05/23/2011 Document Reviewed: 05/07/2011 [facility] Patient Information 2012 [place], [place].

## 2011-08-31 NOTE — BH Assessment (Signed)
Pt has been accepted to [facility] [place] to [facility]. Writer alerted [employer] that pt's gait was wobbly and slow. Pt states he is weak because he isn't eating due to severe constipation. AC requested pt come to [facility] after midnight as [facility] has several pts being admitted prior to midnight. Pt notified pt's RN.

## 2011-08-31 NOTE — Progress Notes (Signed)
  Subjective:    Patient ID: [name], male    DOB: 1951-06-11, 61 y.o.   MRN: [id number]  HPI patient recently he was discharged on a combination of Abilify Depakote and Remeron. Patient has become dysfunctional in home he is unable to do any of his ADLs. He is unable to answer simple questions since discharge. He also has been bothered by severe constipation since he left the hospital.. He has underlying bipolar disease with significant renal related to the previous renal cell cancer. He has been on since stopping his lithium and changing to other medications.    Review of Systems main problem has been constipation.     Objective:   Physical Exam  Constitutional:       Patient is a disheveled male who is in no distress and is cooperative he  Eyes: Pupils are equal, round, and reactive to light.  Neck: No JVD present. No tracheal deviation present. No thyromegaly present.  Cardiovascular: Normal rate and normal heart sounds.  Exam reveals no gallop and no friction rub.   No murmur heard. Pulmonary/Chest: No respiratory distress. He has no wheezes. He has no rales. He exhibits no tenderness.  Abdominal: He exhibits no mass. There is no tenderness. There is no rebound and no guarding.  Lymphadenopathy:    He has no cervical adenopathy.      [facility] reading (PRIMARY) by  Dr.Worth Kober acute abdominal series shows large amount of stool in the large bowel especially in the descending colon..     Assessment & Plan:  Severe bipolar disease with depression. 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 He is unable to do any of his ADLs. He is also severely constipated it which I feel is probably medication related.

## 2011-08-31 NOTE — ED Provider Notes (Signed)
History     CSN: [id number]  Arrival date & time 08/31/11  1213   First MD Initiated Contact with Patient 08/31/11 1241      Chief Complaint  Patient presents with  . Constipation  . Depression    HPI Patient was sent over to the emergency room for worsening symptoms of depression. Patient was recently admitted to [facility] behavioral health within the last week. Patient has long history of bipolar disorder. Several weeks ago he started having worsening symptoms of depression.  Patient used to take lithium that had worked well for him but had to have this discontinued because of medical complications. The patient went back to see his doctor today because of these worsening symptoms. Dr. Cleta Alberts saw him in the office and sent in to the emergency room to be reevaluated for psychiatric admission. Patient denies suicidal or homicidal ideations at this time. He's felt very withdrawn and depressed. He has not been able to sleep. He's had no energy to take care of this simple activities of daily living. In addition, patient has been having trouble with constipation that he attributes to the new medications. He has not had any vomiting or diarrhea but has not had a bowel movement in several days. He has been trying MiraLAX without relief.  At his doctor's office the patient had a rectal exam that reportedly did not show any impaction. He also had an acute abdominal series that was consistent with constipation. Past Medical History  Diagnosis Date  . Renal cell carcinoma     chemo and xrt--lost rt kidney  . Childhood asthma   . Sinusitis   . Cellulitis     after left foot surgery  . Osteoarthritis   . OSA (obstructive sleep apnea)     NPSG 05-08-07 AHI 63.1/hr,desat t0 80%/loud snoring  . Bipolar 1 disorder   . Hyperlipemia   . Mental disorder     Past Surgical History  Procedure Date  . Podiatric surgery     left foot toes,   . Bunionectomy     Family History  Problem Relation Age of  Onset  . Hypertension    . Sleep apnea    . Emphysema    . Prostate cancer      History  Substance Use Topics  . Smoking status: Never Smoker   . Smokeless tobacco: Not on file  . Alcohol Use: Yes     6 beers a uear      Review of Systems  All other systems reviewed and are negative.    Allergies  Review of patient's allergies indicates no known allergies.  Home Medications   Current Outpatient Rx  Name Route Sig Dispense Refill  . ARIPIPRAZOLE 5 MG PO TABS Oral Take 3 tablets (15 mg total) by mouth daily. For mood control. 1 tablet 0  . COQ10 100 MG PO CAPS Oral Take 1 capsule by mouth daily. For nutritional supplementation. 30 each 0  . DIVALPROEX SODIUM ER 250 MG PO TB24 Oral Take 500-1,000 mg by mouth 2 (two) times daily. Take by mouth 2 in AM and 4 at Bedtime.For mood control.    Marland Kitchen MIRTAZAPINE 30 MG PO TABS  Take by mouth ONE FOURTH tablet at bed time For insomnia. 1 tablet 0  . ONE-DAILY MULTI VITAMINS PO TABS Oral Take 1 tablet by mouth daily. For nutritional supplementation. 30 tablet 0  . FISH OIL 1200 MG PO CAPS Oral Take 1 capsule (1,200 mg total) by mouth  daily. For depression and brain health 30 capsule 0  . POLYETHYLENE GLYCOL 3350 PO PACK Oral Take 17 g by mouth daily. For constipation 28 each 0  . PRAVASTATIN SODIUM 40 MG PO TABS Oral Take 1 tablet (40 mg total) by mouth daily. To help lower cholesterol. 30 tablet 0  . PRAZOSIN HCL 2 MG PO CAPS Oral Take 1 capsule (2 mg total) by mouth at bedtime. For insomnia. May repeat, but be careful getting up in the middle of the night.  This may lower blood pressure and make you dizzy if you stand up quickly while it is 40 capsule 0  . TESTOSTERONE ENANTHATE 200 MG/ML IM OIL Intramuscular Inject 1 mL (200 mg total) into the muscle every 14 (fourteen) days. Apply as directed.  Patient last received an injection on 08/13/11 5 mL 0  . ZOLPIDEM TARTRATE 10 MG PO TABS Oral Take 10 mg by mouth at bedtime as needed. For sleep      . POLYETHYLENE GLYCOL 3350 PO POWD Oral Take 17 g by mouth daily. 255 g 0    BP 158/114  Pulse 77  Temp(Src) 98.6 F (37 C) (Oral)  Resp 16  Ht 6\' 1"  (1.854 m)  Wt 206 lb (93.441 kg)  BMI 27.18 kg/m2  SpO2 100%  Physical Exam  Nursing note and vitals reviewed. Constitutional: He appears well-developed and well-nourished. No distress.  HENT:  Head: Normocephalic and atraumatic.  Right Ear: External ear normal.  Left Ear: External ear normal.  Eyes: Conjunctivae are normal. Right eye exhibits no discharge. Left eye exhibits no discharge. No scleral icterus.  Neck: Neck supple. No tracheal deviation present.  Cardiovascular: Normal rate, regular rhythm and intact distal pulses.   Pulmonary/Chest: Effort normal and breath sounds normal. No stridor. No respiratory distress. He has no wheezes. He has no rales.  Abdominal: Soft. Bowel sounds are normal. He exhibits no distension and no mass. There is no tenderness. There is no rebound and no guarding.  Musculoskeletal: He exhibits no edema and no tenderness.  Neurological: He is alert. He has normal strength. No sensory deficit. Cranial nerve deficit:  no gross defecits noted. He exhibits normal muscle tone. He displays no seizure activity. Coordination normal.  Skin: Skin is warm and dry. No rash noted.  Psychiatric: His speech is delayed. His speech is not rapid and/or pressured. He is slowed and withdrawn. He exhibits a depressed mood. He expresses no suicidal plans and no homicidal plans.    ED Course  Procedures (including critical care time)  Labs Reviewed  CBC - Abnormal; Notable for the following:    Hemoglobin 17.8 (*)    Platelets 113 (*)    All other components within normal limits  COMPREHENSIVE METABOLIC PANEL - Abnormal; Notable for the following:    BUN 27 (*)    Creatinine, Ser 2.21 (*)    AST 45 (*)    GFR calc non Af Amer 31 (*)    GFR calc Af Amer 35 (*)    All other components within normal limits  ETHANOL   ACETAMINOPHEN LEVEL  URINE RAPID DRUG SCREEN (HOSP PERFORMED)   Dg Abd Acute W/chest  08/31/2011  OVERREAD BY [facility] *RADIOLOGY REPORT*  Clinical Data: Periumbilical pain  ACUTE ABDOMEN SERIES (ABDOMEN 2 VIEW & CHEST 1 VIEW)  Comparison: None.  Findings: Normal cardiac silhouette.  Lungs are clear.  No free air beneath hemidiaphragms.  No dilated loops of large or small bowel.  Small amount gas the  rectum.  No pathologic calcifications.  Multiple surgical clips the right abdomen.  IMPRESSION:  1.  Clear chest. 2.  No intraperitoneal free air. 3.  No bowel obstruction.  Original Report Authenticated By: Genevive Bi, M.D.      MDM  Patient appears to have mild dehydration but his labs appear relatively stable. Continue IV hydration. I have asked the act team to assess the patient for psychiatric admission. I did review the outpatient films the patient had today. There is no signs of bowel obstruction. . He appears stable from a medical standpoint.        Celene Kras, MD 08/31/11 [phone]

## 2011-08-31 NOTE — ED Notes (Signed)
Report given to [place], RN pt moved to room 26

## 2011-08-31 NOTE — ED Notes (Signed)
Sent over from Dr. Ellis Parents office to be medically cleared and have evaluation by ACT team. Pt is also dealing with constipation and complains of abdominal pain. Last BM one week ago. Denies SI and HI. C/o depression. Pt wanded, in blue scrubs. Wife at bedside

## 2011-09-01 ENCOUNTER — Inpatient Hospital Stay (HOSPITAL_COMMUNITY)
Admission: RE | Admit: 2011-09-01 | Discharge: 2011-09-06 | DRG: 885 | Disposition: A | Discharge status: Other Acute Inpatient Hospital | Payer: 59 | Source: Ambulatory Visit | Attending: Emergency Medicine | Admitting: Emergency Medicine

## 2011-09-01 ENCOUNTER — Encounter (HOSPITAL_COMMUNITY): Payer: Self-pay | Admitting: *Deleted

## 2011-09-01 DIAGNOSIS — K5900 Constipation, unspecified: Secondary | ICD-10-CM | POA: Diagnosis present

## 2011-09-01 DIAGNOSIS — J45909 Unspecified asthma, uncomplicated: Secondary | ICD-10-CM | POA: Diagnosis present

## 2011-09-01 DIAGNOSIS — G4733 Obstructive sleep apnea (adult) (pediatric): Secondary | ICD-10-CM | POA: Diagnosis present

## 2011-09-01 DIAGNOSIS — F3130 Bipolar disorder, current episode depressed, mild or moderate severity, unspecified: Principal | ICD-10-CM | POA: Diagnosis present

## 2011-09-01 DIAGNOSIS — Z85528 Personal history of other malignant neoplasm of kidney: Secondary | ICD-10-CM

## 2011-09-01 DIAGNOSIS — M1990 Unspecified osteoarthritis, unspecified site: Secondary | ICD-10-CM | POA: Diagnosis present

## 2011-09-01 DIAGNOSIS — F3160 Bipolar disorder, current episode mixed, unspecified: Secondary | ICD-10-CM

## 2011-09-01 DIAGNOSIS — R4182 Altered mental status, unspecified: Secondary | ICD-10-CM

## 2011-09-01 DIAGNOSIS — R259 Unspecified abnormal involuntary movements: Secondary | ICD-10-CM | POA: Diagnosis present

## 2011-09-01 DIAGNOSIS — N183 Chronic kidney disease, stage 3 unspecified: Secondary | ICD-10-CM | POA: Diagnosis present

## 2011-09-01 DIAGNOSIS — I129 Hypertensive chronic kidney disease with stage 1 through stage 4 chronic kidney disease, or unspecified chronic kidney disease: Secondary | ICD-10-CM | POA: Diagnosis present

## 2011-09-01 DIAGNOSIS — R531 Weakness: Secondary | ICD-10-CM

## 2011-09-01 DIAGNOSIS — Z79899 Other long term (current) drug therapy: Secondary | ICD-10-CM

## 2011-09-01 DIAGNOSIS — R4789 Other speech disturbances: Secondary | ICD-10-CM | POA: Diagnosis not present

## 2011-09-01 DIAGNOSIS — E785 Hyperlipidemia, unspecified: Secondary | ICD-10-CM | POA: Diagnosis present

## 2011-09-01 MED ORDER — MAGNESIUM CITRATE PO SOLN
1.0000 | Freq: Once | ORAL | Status: AC
  Administered 2011-09-01: 1 via ORAL

## 2011-09-01 MED ORDER — BISACODYL 5 MG PO TBEC
10.0000 mg | DELAYED_RELEASE_TABLET | Freq: Every evening | ORAL | Status: DC
  Administered 2011-09-01 – 2011-09-03 (×3): 10 mg via ORAL
  Filled 2011-09-01 (×5): qty 2

## 2011-09-01 MED ORDER — OMEGA-3-ACID ETHYL ESTERS 1 G PO CAPS
1.0000 g | ORAL_CAPSULE | Freq: Every day | ORAL | Status: DC
  Administered 2011-09-02 – 2011-09-03 (×2): 1 g via ORAL
  Filled 2011-09-01 (×5): qty 1

## 2011-09-01 MED ORDER — SENNOSIDES-DOCUSATE SODIUM 8.6-50 MG PO TABS
2.0000 | ORAL_TABLET | Freq: Two times a day (BID) | ORAL | Status: DC
  Administered 2011-09-02 – 2011-09-05 (×7): 2 via ORAL
  Filled 2011-09-01 (×12): qty 2

## 2011-09-01 MED ORDER — ALUM & MAG HYDROXIDE-SIMETH 200-200-20 MG/5ML PO SUSP
30.0000 mL | ORAL | Status: DC | PRN
  Administered 2011-09-03: 30 mL via ORAL

## 2011-09-01 MED ORDER — POLYETHYLENE GLYCOL 3350 17 G PO PACK
17.0000 g | PACK | Freq: Every day | ORAL | Status: DC
  Administered 2011-09-02 – 2011-09-05 (×3): 17 g via ORAL
  Filled 2011-09-01 (×8): qty 1

## 2011-09-01 MED ORDER — ADULT MULTIVITAMIN W/MINERALS CH
1.0000 | ORAL_TABLET | Freq: Every day | ORAL | Status: DC
  Administered 2011-09-02 – 2011-09-03 (×2): 1 via ORAL
  Filled 2011-09-01 (×5): qty 1

## 2011-09-01 MED ORDER — MIRTAZAPINE 30 MG PO TABS
30.0000 mg | ORAL_TABLET | Freq: Every day | ORAL | Status: DC
  Administered 2011-09-01 – 2011-09-03 (×3): 30 mg via ORAL
  Filled 2011-09-01 (×5): qty 1

## 2011-09-01 MED ORDER — PRAZOSIN HCL 2 MG PO CAPS
2.0000 mg | ORAL_CAPSULE | Freq: Every day | ORAL | Status: DC
  Administered 2011-09-01 – 2011-09-03 (×3): 2 mg via ORAL
  Filled 2011-09-01 (×4): qty 1

## 2011-09-01 MED ORDER — ACETAMINOPHEN 325 MG PO TABS: 650.0000 mg | ORAL_TABLET | Freq: Four times a day (QID) | ORAL | Status: DC | PRN

## 2011-09-01 MED ORDER — MINERAL OIL RE ENEM: 1.0000 | ENEMA | Freq: Once | RECTAL | Status: DC

## 2011-09-01 MED ORDER — SORBITOL 70 % SOLN
30.0000 mL | Freq: Every day | Status: DC | PRN
  Administered 2011-09-04: 30 mL via ORAL
  Filled 2011-09-01 (×3): qty 30

## 2011-09-01 MED ORDER — MAGNESIUM HYDROXIDE 400 MG/5ML PO SUSP: 30.0000 mL | Freq: Every day | ORAL | Status: DC | PRN

## 2011-09-01 MED ORDER — MINERAL OIL RE ENEM
1.0000 | ENEMA | Freq: Once | RECTAL | Status: AC
  Administered 2011-09-01: 1 via RECTAL
  Filled 2011-09-01: qty 1

## 2011-09-01 NOTE — ED Provider Notes (Signed)
Act team indicates pt accepted to [facility], bed ready, Dr Dan Humphreys.   Suzi Roots, MD 09/01/11 [phone]

## 2011-09-01 NOTE — Progress Notes (Addendum)
09/01/2011 Nursing D 1500 D [name] is in his bed...where he has been the majority of the day. HE is somnolent....quiet.He has a blank, flat, expressionless face. HE fascilates from being cooperative and logical to being agitated and difficult to process with . He is anxious and agitated, at times. On the call bell frequently to ask the same question over and over( " has my wife called? What do I have to do so you can speak with my wife? When is the dr going to see me? ". He demonstrates diff processing and remembering conversations ( probably) due to his high level of  anxiety. He states " what happened to me? I was walking last week.. I  can barely lift my head off the bed today"... He is encouraged to force his intake of water today.to help with  His constipation. A HE is assisted to get mineral oil retention enema in his bed about 1300....then assisted to walk to his toilet...where he sat for several minutes ( about 8). He reports expelling only gas and small amount of liquid. He is encouraged by this nurse to use his call bell, to NOT get out of bed without calling for assistance and  To use GREAT  caution when getting up and ALWAYS ask for staff assistance before doing this alone. A He is examined by medical doctor mid-AM. R Safety is maintaiend and POC includes fostering therapeutic relationship already established  PD RN [facility]

## 2011-09-01 NOTE — Progress Notes (Signed)
Patient ID: [name], male   DOB: 23-May-1951, 61 y.o.   MRN: [id number] This 61 yo male was here last week, has returned tonight feeling depressed and suicidal.  In relating his concerns, he states he has been having constant pain in his head and abdomen, has been constipated for some time and unable to move his bowels for about a week, and voices concerns d/t hx of renal and colon cancer.  Last cancer surgery was 2003, stated his tumor had wrapped around his stomach and inferior vena cava.  States had rt nephrectomy in 1998 d/t cancer.  In addition, he feels his tremors are worse, gait becoming more unsteady, and seems to be having some difficulty swallowing.  Has been eating very little d/t this and also the constipation.   Affect flat, depressed.  States a lot of his depression stems from feeling he has serious medical issues, but that they are being brushed aside d/t psych issues.  Stated was too tired to sit through most of the admission, was unable to sign papers d/t tremors.  Was offered a meal which he refused but did take a few swallows of water which he did with effort.  Was taken to his room via w/c, assisted into bed where he is resting quietly.  02 sats were 94% RA, order obtained for 02 at 2L per [place]/ .  Will be evaluated further in am.  Reoriented briefly to unit, will continue to monitor.

## 2011-09-01 NOTE — Consult Note (Signed)
Hospital Consult Note Date: 09/01/2011  PCP: Lucilla Edin, MD, MD  Chief Complaint: Abdominal pain  History of Present Illness: This is a 61 year old gentleman with a past medical history of renal cancer status post radiation and removal of the right kidney osteoarthritis that was recently discharged from behavioral health on 08/29/2011 for suicidal thoughts and poor sleeps went to urgent care and was noted that he has become dysfunctional since discharge. He kept complaining of abdominal pain an acute abdominal series was done that showed large amount of stool in his colon surgery he was admitted to behavioral health and we were consulted for this abdominal discomfort on admission his saturations were/94% on manner repeated on the day of this consult is 99% on room air and his vitals are stable. He relates no other complaints.  Allergies: Review of patient's allergies indicates no known allergies. Past Medical History  Diagnosis Date  . Renal cell carcinoma     chemo and xrt--lost rt kidney  . Childhood asthma   . Sinusitis   . Cellulitis     after left foot surgery  . Osteoarthritis   . OSA (obstructive sleep apnea)     NPSG 05-08-07 AHI 63.1/hr,desat t0 80%/loud snoring  . Bipolar 1 disorder   . Hyperlipemia   . Mental disorder    Prior to Admission medications   Medication Sig Start Date End Date Taking? Authorizing Provider  ARIPiprazole (ABILIFY) 5 MG tablet Take 3 tablets (15 mg total) by mouth daily. For mood control. 08/29/11   Mike Craze, MD  Coenzyme Q10 (COQ10) 100 MG CAPS Take 1 capsule by mouth daily. For nutritional supplementation. 08/29/11   Mike Craze, MD  divalproex (DEPAKOTE ER) 250 MG 24 hr tablet Take 500-1,000 mg by mouth 2 (two) times daily. Take by mouth 2 in AM and 4 at Bedtime.For mood control. 08/29/11   Mike Craze, MD  mirtazapine (REMERON) 30 MG tablet Take by mouth ONE FOURTH tablet at bed time For insomnia. 08/29/11   Mike Craze, MD    Multiple Vitamin (MULTIVITAMIN) tablet Take 1 tablet by mouth daily. For nutritional supplementation. 08/29/11   Mike Craze, MD  Omega-3 Fatty Acids (FISH OIL) 1200 MG CAPS Take 1 capsule (1,200 mg total) by mouth daily. For depression and brain health 08/29/11   Mike Craze, MD  polyethylene glycol [facility] Ethelene Hal) packet Take 17 g by mouth daily. For constipation 08/29/11 09/01/11  Mike Craze, MD  pravastatin (PRAVACHOL) 40 MG tablet Take 1 tablet (40 mg total) by mouth daily. To help lower cholesterol. 08/29/11   Mike Craze, MD  prazosin (MINIPRESS) 2 MG capsule Take 1 capsule (2 mg total) by mouth at bedtime. For insomnia. May repeat, but be careful getting up in the middle of the night.  This may lower blood pressure and make you dizzy if you stand up quickly while it is 08/29/11 08/28/12  Mike Craze, MD  testosterone enanthate (DELATESTRYL) 200 MG/ML injection Inject 1 mL (200 mg total) into the muscle every 14 (fourteen) days. Apply as directed.  Patient last received an injection on 08/13/11 08/29/11   Mike Craze, MD  zolpidem (AMBIEN) 10 MG tablet Take 10 mg by mouth at bedtime as needed. For sleep    Historical Provider, MD   Past Surgical History  Procedure Date  . Podiatric surgery     left foot toes,   . Bunionectomy   . Nephrectomy 1998  rt., d/t cancer  . Cholecystectomy   . Colon surgery    Family History  Problem Relation Age of Onset  . Hypertension    . Sleep apnea    . Emphysema    . Prostate cancer     History   Social History  . Marital Status: Married    Spouse Name: N/A    Number of Children: 0  . Years of Education: N/A   Occupational History  . [employer]-product developer    Social History Main Topics  . Smoking status: Never Smoker   . Smokeless tobacco: Not on file  . Alcohol Use: Yes     6 beers a uear  . Drug Use: No  . Sexually Active: Not Currently   Other Topics Concern  . Not on file   Social History Narrative   . No narrative on file    REVIEW OF SYSTEMS:  Constitutional:  No weight loss, night sweats, Fevers, chills, fatigue.  HEENT:  No headaches, Difficulty swallowing,Tooth/dental problems,Sore throat,  No sneezing, itching, ear ache, nasal congestion, post nasal drip,  Cardio-vascular:  No chest pain, Orthopnea, PND, swelling in lower extremities, anasarca, dizziness, palpitations  GI:  No heartburn, indigestion, abdominal pain, nausea, vomiting, diarrhea, change in bowel habits, loss of appetite  Resp:  No shortness of breath with exertion or at rest. No excess mucus, no productive cough, No non-productive cough, No coughing up of blood.No change in color of mucus.No wheezing.No chest wall deformity  Skin:  no rash or lesions.  GU:  no dysuria, change in color of urine, no urgency or frequency. No flank pain.  Musculoskeletal:  No joint pain or swelling. No decreased range of motion. No back pain.  Psych:  No change in mood or affect. No depression or anxiety. No memory loss.   Physical Exam: Filed Vitals:   09/01/11 0100 09/01/11 0730 09/01/11 1145  BP: 114/80 153/79 142/83  Pulse: 73 88 63  Temp: 97.3 F (36.3 C) 97 F (36.1 C) 96.9 F (36.1 C)  TempSrc: Oral Oral Oral  Resp:  20   SpO2: 94%  99%   No intake or output data in the 24 hours ending 09/01/11 1306 BP 142/83  Pulse 63  Temp(Src) 96.9 F (36.1 C) (Oral)  Resp 20  SpO2 99%  General Appearance:    Alert, cooperative, no distress, appears stated age  Head:    Normocephalic, without obvious abnormality, atraumatic  Eyes:    PERRL, conjunctiva/corneas clear, EOM's intact, fundi    benign, both eyes       Ears:    Normal TM's and external ear canals, both ears  Nose:   Nares normal, septum midline, mucosa normal, no drainage    or sinus tenderness  Throat:   poor oral hygiene dry mucous membranes .  Neck:   Supple, symmetrical, trachea midline, no adenopathy;       thyroid:  No  enlargement/tenderness/nodules; no carotid   bruit or JVD  Back:     Symmetric, no curvature, ROM normal, no CVA tenderness  Lungs:     Clear to auscultation bilaterally, respirations unlabored  Chest wall:    No tenderness or deformity  Heart:    Regular rate and rhythm, S1 and S2 normal, no murmur, rub   or gallop  Abdomen:     Soft, non-tender, bowel sounds active all four quadrants,    no masses, no organomegaly he did slightly distended abdomen but he relates he does not  new.         Extremities:   Extremities normal, atraumatic, no cyanosis or edema  Pulses:   2+ and symmetric all extremities  Skin:   Skin color, texture, turgor normal, no rashes or lesions  Lymph nodes:   Cervical, supraclavicular, and axillary nodes normal  Neurologic:   CNII-XII intact. Normal strength, sensation and reflexes      throughout   Lab results:  Basename 08/31/11 1230 08/31/11 1159  NA 136 137  K 5.1 5.2  CL 96 101  CO2 31 26  GLUCOSE 89 90  BUN 27* 28*  CREATININE 2.21* 2.14*  CALCIUM 9.6 9.4  MG -- --  PHOS -- --    Basename 08/31/11 1230 08/31/11 1159  AST 45* 51*  ALT 51 45  ALKPHOS 67 62  BILITOT 0.5 0.6  PROT 8.2 7.6  ALBUMIN 4.2 4.5   No results found for this basename: LIPASE:2,AMYLASE:2 in the last 72 hours  Basename 08/31/11 1230 08/31/11 1100  WBC 4.8 3.6*  NEUTROABS -- --  HGB 17.8* 16.1  HCT 50.5 49.4  MCV 93.7 96.4  PLT 113* --   No results found for this basename: CKTOTAL:3,CKMB:3,CKMBINDEX:3,TROPONINI:3 in the last 72 hours No components found with this basename: POCBNP:3 No results found for this basename: DDIMER:2 in the last 72 hours No results found for this basename: HGBA1C:2 in the last 72 hours No results found for this basename: CHOL:2,HDL:2,LDLCALC:2,TRIG:2,CHOLHDL:2,LDLDIRECT:2 in the last 72 hours  Basename 08/31/11 1058  TSH 2.902  T4TOTAL --  T3FREE --  THYROIDAB --   No results found for this basename:  VITAMINB12:2,FOLATE:2,FERRITIN:2,TIBC:2,IRON:2,RETICCTPCT:2 in the last 72 hours Imaging results:  Dg Abd Acute W/chest  08/31/2011  OVERREAD BY [facility] *RADIOLOGY REPORT*  Clinical Data: Periumbilical pain  ACUTE ABDOMEN SERIES (ABDOMEN 2 VIEW & CHEST 1 VIEW)  Comparison: None.  Findings: Normal cardiac silhouette.  Lungs are clear.  No free air beneath hemidiaphragms.  No dilated loops of large or small bowel.  Small amount gas the rectum.  No pathologic calcifications.  Multiple surgical clips the right abdomen.  IMPRESSION:  1.  Clear chest. 2.  No intraperitoneal free air. 3.  No bowel obstruction.  Original Report Authenticated By: Genevive Bi, M.D.   Other results: EKG:    Patient Active Hospital Problem List: No active hospital problems. -constipation: will give him a minimal oil enema we'll start him on MiraLAX, and Senokot S. His physical exam is unremarkable his abdomen is nontender to palpation, house no rebound or guarding, and has positive bowel sounds. his labs on admission showed just some mild dehydration. This probably is the patient has not been eating or drinking as he relates to me that he cannot eat as he feels full. He doesn't have a white count he doesn't have a fever so we'll treat his constipation and reevaluate in the morning.   Code Status: Full code Family Communication: [phone] (Home)   Marinda Elk M.D. [facility] [phone] 09/01/2011, 1:06 PM

## 2011-09-01 NOTE — [facility] Suicide Risk Assessment (Signed)
Suicide Risk Assessment  Admission Assessment     Demographic factors:  Assessment Details Time of Assessment: Admission Information Obtained From: Patient Current Mental Status:  Current Mental Status: Suicidal ideation indicated by patient Loss Factors:  Loss Factors: Decline in physical health;Financial problems / change in socioeconomic status Historical Factors:    Risk Reduction Factors:  Risk Reduction Factors: Sense of responsibility to family  CLINICAL FACTORS:   Depression:   Hopelessness Severe Alcohol/Substance Abuse/Dependencies  COGNITIVE FEATURES THAT CONTRIBUTE TO RISK:  Closed-mindedness    SUICIDE RISK:   Mild:  Suicidal ideation of limited frequency, intensity, duration, and specificity.  There are no identifiable plans, no associated intent, mild dysphoria and related symptoms, good self-control (both objective and subjective assessment), few other risk factors, and identifiable protective factors, including available and accessible social support.  PLAN OF CARE: Consulted with Inpatient hospitalists as he is using nasal O2 and has had a marked change in physical capacity since discharge 3/14. Will follow their recommendations and try to transfer him to medical floor. Pt also has constipation for last few days.   [name] 09/01/2011, 8:18 PM

## 2011-09-01 NOTE — H&P (Signed)
  Pt was seen by me today and I agree with the key elements documented in H&P.  

## 2011-09-01 NOTE — Progress Notes (Signed)
[facility] Group Notes:  (Counselor/Nursing/MHT/Case Management/Adjunct)  09/01/2011 1315  Type of Therapy:  Group Therapy  Participation Level:  Did Not Attend  [name] 09/01/2011, 4:35 PM

## 2011-09-01 NOTE — Progress Notes (Signed)
Adult Psychosocial Assessment Update Interdisciplinary Team  Previous [facility] admissions/discharges:  Admissions Discharges  Date:  08-22-11 Date: 08-29-11  Date: Date:  Date: Date:  Date: Date:  Date: Date:   Changes since the last Psychosocial Assessment (including adherence to outpatient mental health and/or substance abuse treatment, situational issues contributing to decompensation and/or relapse). Pt.  Reports problems with medication and physical problems from the medication.  Pt. Reports pain and the return of his depression and SI thoughts in the last couple of days. Pt. Also reports problems with sleeping and stomach issues.             Discharge Plan 1. Will you be returning to the same living situation after discharge?   Yes: x No:      If no, what is your plan?    Pt. Will return to live there with his wife.       2. Would you like a referral for services when you are discharged? Yes:   x  If yes, for what services?  No:       Pt. Wants to be referred to Dr. Sherre Scarlet at discharge.       Summary and Recommendations (to be completed by the evaluator) Pt. Is a 61 year old male admitted for depression and SI thoughts. Pt. Reports problems with sleeping and SI and depression since being d/c on 08/29/11. Pt. Reports he did not follow up with Dr. Bethann Humble but would like another follow-up appointment set up with Dr. Bethann Humble at d/c. Pt. Recommendations include; crisis stabilization, case management, group therapy, and case management.                       Signature:  Neila Gear, 09/01/2011 11:21 AM

## 2011-09-01 NOTE — H&P (Signed)
Psychiatric Admission Assessment Adult  Patient Identification:  [name] Date of Evaluation:  09/01/2011 61yo MWH  CC: severe constipation History of Present Illness: Was inpatient here at [facility] 3/7-3/14/13 for suicidal thoughts and poor sleep. He saw Dr Cle[facility] Alberts [facility] and was noted to have become dysfunctional since discharge not doing ADL's and unable to answer simple questions. Acute abdominal series showed a large amount of stool in the large bowel especially the descending colon. ED physician felt that as he wanted a med change admission to [facility] was reindicated. Today although verbally responsive  to questions does not have eye contact is having to use nasal O2 and sats are no higher than 94% says he can't eat as he feels "full" needs assistance with ambulation etc.    Past Psychiatric History: Managed outpatient by Dr.Plovsky has appt. 09/06/11 Lithium stopped June 2012 due to renal insufficiency after nephrectomy for renal cell carcinoma.This triggered mood elevation resulting in financial stressors.  Substance Abuse History:NONE   Social History:    reports that he has never smoked. He does not have any smokeless tobacco history on file. He reports that he drinks alcohol. He reports that he does not use illicit drugs. Married   Family Psych History: Denies   Past Medical History:     Past Medical History  Diagnosis Date  . Renal cell carcinoma     chemo and xrt--lost rt kidney  . Childhood asthma   . Sinusitis   . Cellulitis     after left foot surgery  . Osteoarthritis   . OSA (obstructive sleep apnea)     NPSG 05-08-07 AHI 63.1/hr,desat t0 80%/loud snoring  . Bipolar 1 disorder   . Hyperlipemia   . Mental disorder        Past Surgical History  Procedure Date  . Podiatric surgery     left foot toes,   . Bunionectomy   . Nephrectomy 1998    rt., d/t cancer  . Cholecystectomy   . Colon surgery     Allergies: No Known  Allergies  Current Medications:  Prior to Admission medications   Medication Sig Start Date End Date Taking? Authorizing Provider  ARIPiprazole (ABILIFY) 5 MG tablet Take 3 tablets (15 mg total) by mouth daily. For mood control. 08/29/11   Mike Craze, MD  Coenzyme Q10 (COQ10) 100 MG CAPS Take 1 capsule by mouth daily. For nutritional supplementation. 08/29/11   Mike Craze, MD  divalproex (DEPAKOTE ER) 250 MG 24 hr tablet Take 500-1,000 mg by mouth 2 (two) times daily. Take by mouth 2 in AM and 4 at Bedtime.For mood control. 08/29/11   Mike Craze, MD  mirtazapine (REMERON) 30 MG tablet Take by mouth ONE FOURTH tablet at bed time For insomnia. 08/29/11   Mike Craze, MD  Multiple Vitamin (MULTIVITAMIN) tablet Take 1 tablet by mouth daily. For nutritional supplementation. 08/29/11   Mike Craze, MD  Omega-3 Fatty Acids (FISH OIL) 1200 MG CAPS Take 1 capsule (1,200 mg total) by mouth daily. For depression and brain health 08/29/11   Mike Craze, MD  polyethylene glycol [facility] Ethelene Hal) packet Take 17 g by mouth daily. For constipation 08/29/11 09/01/11  Mike Craze, MD  pravastatin (PRAVACHOL) 40 MG tablet Take 1 tablet (40 mg total) by mouth daily. To help lower cholesterol. 08/29/11   Mike Craze, MD  prazosin (MINIPRESS) 2 MG capsule Take 1 capsule (2 mg total) by mouth  at bedtime. For insomnia. May repeat, but be careful getting up in the middle of the night.  This may lower blood pressure and make you dizzy if you stand up quickly while it is 08/29/11 08/28/12  Mike Craze, MD  testosterone enanthate (DELATESTRYL) 200 MG/ML injection Inject 1 mL (200 mg total) into the muscle every 14 (fourteen) days. Apply as directed.  Patient last received an injection on 08/13/11 08/29/11   Mike Craze, MD  zolpidem (AMBIEN) 10 MG tablet Take 10 mg by mouth at bedtime as needed. For sleep    Historical Provider, MD    Mental Status Examination/Evaluation: Objective:  Appearance:  Disheveled  Psychomotor Activity:  Decreased  Eye Contact::  Poor  Speech:  Clear and Coherent  Volume:  Normal  Mood:  Depressed/anxious - does not feel well   Affect:  Congruent  Thought Process: clear rational goal oriented     Orientation:  Full  Thought Content:  Denies AVH not psychotic   Suicidal Thoughts:  No  Homicidal Thoughts:  No  Judgement:  Impaired  Insight:  Fair    DIAGNOSIS:    AXIS I Bipolar, mixed  AXIS II Deferred  AXIS III See medical history.  AXIS IV other psychosocial or environmental problems  AXIS V 51-60 moderate symptoms      Treatment Plan Summary: Consulted with Inpatient hospitalists as he is using nasal O2 and has had a marked change in physical capacity since discharge 3/14.

## 2011-09-01 NOTE — Progress Notes (Signed)
[facility] Adult Inpatient Family/Significant Other Suicide Prevention Education  Suicide Prevention Education:  Education Completed; Bonita Quin Deleo-[phone]-( Pt.'s wife) has been identified by the patient as the family member/significant other with whom the patient will be residing, and identified as the person(s) who will aid the patient in the event of a mental health crisis (suicidal ideations/suicide attempt).  With written consent from the patient, the family member/significant other has been provided the following suicide prevention education, prior to the and/or following the discharge of the patient.  The suicide prevention education provided includes the following:  Suicide risk factors  Suicide prevention and interventions  National Suicide Hotline telephone number  [facility] assessment telephone number  [facility] Emergency Assistance 911  [facility] and/or Residential Mobile Crisis Unit telephone number  Request made of family/significant other to:  Remove weapons (e.g., guns, rifles, knives), all items previously/currently identified as safety concern.Pt.'s wife states there are no guns or weapons in the home.    Remove drugs/medications (over-the-counter, prescriptions, illicit drugs), all items previously/currently identified as a safety concern. Pt.'s wife states the home is secure.  Pt.'s wife is concerned about medications and states that pt. Has been having problems with constipation and not sleeping due to medication. Pt.'s wife states that  Doctor at Urgent care states that the  pt. may need to be on Lithium again. Pt.'s wife was given SI education and hotline numbers and agreed to use them if needed.   The family member/significant other verbalizes understanding of the suicide prevention education information provided.  The family member/significant other agrees to remove the items of safety concern listed above.  Lamar Blinks  [place] 09/01/2011, 2:44 PM

## 2011-09-02 ENCOUNTER — Other Ambulatory Visit: Payer: Self-pay | Admitting: Emergency Medicine

## 2011-09-02 ENCOUNTER — Telehealth: Payer: Self-pay

## 2011-09-02 DIAGNOSIS — F3130 Bipolar disorder, current episode depressed, mild or moderate severity, unspecified: Principal | ICD-10-CM

## 2011-09-02 DIAGNOSIS — F319 Bipolar disorder, unspecified: Secondary | ICD-10-CM

## 2011-09-02 LAB — URINALYSIS, ROUTINE W REFLEX MICROSCOPIC
Bilirubin Urine: NEGATIVE
Glucose, UA: NEGATIVE mg/dL
Hgb urine dipstick: NEGATIVE
Ketones, ur: NEGATIVE mg/dL
Nitrite: NEGATIVE
Specific Gravity, Urine: 1.024 (ref 1.005–1.030)
pH: 6.5 (ref 5.0–8.0)

## 2011-09-02 LAB — TESTOSTERONE, FREE, TOTAL, SHBG: Testosterone, Free: 101.5 pg/mL (ref 47.0–244.0)

## 2011-09-02 MED ORDER — DIVALPROEX SODIUM ER 500 MG PO TB24
500.0000 mg | ORAL_TABLET | Freq: Every day | ORAL | Status: DC
  Administered 2011-09-03: 500 mg via ORAL
  Filled 2011-09-02 (×4): qty 1

## 2011-09-02 MED ORDER — AMLODIPINE BESYLATE 5 MG PO TABS
5.0000 mg | ORAL_TABLET | Freq: Every day | ORAL | Status: DC
Start: 2011-09-02 — End: 2011-09-07
  Administered 2011-09-02 – 2011-09-06 (×4): 5 mg via ORAL
  Filled 2011-09-02 (×7): qty 1

## 2011-09-02 MED ORDER — ENSURE IMMUNE HEALTH PO LIQD
237.0000 mL | Freq: Three times a day (TID) | ORAL | Status: DC
  Administered 2011-09-03: 18:00:00 via ORAL
  Administered 2011-09-03 – 2011-09-04 (×2): 237 mL via ORAL
  Administered 2011-09-05: 07:00:00 via ORAL
  Administered 2011-09-06: 237 mL via ORAL
  Administered 2011-09-06: 07:00:00 via ORAL
  Filled 2011-09-02 (×4): qty 237

## 2011-09-02 MED ORDER — DIVALPROEX SODIUM ER 500 MG PO TB24
500.0000 mg | ORAL_TABLET | Freq: Every day | ORAL | Status: DC
  Administered 2011-09-03 (×2): 500 mg via ORAL
  Filled 2011-09-02 (×3): qty 1

## 2011-09-02 MED ORDER — SERTRALINE HCL 50 MG PO TABS
50.0000 mg | ORAL_TABLET | Freq: Every day | ORAL | Status: DC
  Administered 2011-09-02 – 2011-09-03 (×2): 50 mg via ORAL
  Filled 2011-09-02 (×5): qty 1

## 2011-09-02 MED ORDER — LACTULOSE 10 GM/15ML PO SOLN
20.0000 g | Freq: Three times a day (TID) | ORAL | Status: AC
  Administered 2011-09-02 – 2011-09-03 (×2): 20 g via ORAL
  Filled 2011-09-02 (×3): qty 30

## 2011-09-02 NOTE — Progress Notes (Signed)
CT scan scheduled for 09/03/11 @ 2:30 PM. Needs to arrive at 2:15. [facility]. Security was asked to pick up contrast. Patient concerned that he will be unable to drink contrast due to distended abdomen and difficulty swallowing. Patient provided with a urine specimen cup. Patient also concerned about inability to provide specimen related to low fluid intake.

## 2011-09-02 NOTE — Telephone Encounter (Signed)
LMOM for pt's wife per Dr Ellis Parents request that he is referring pt to Dr Sara Chu at [facility] who specializes in Bipolar.

## 2011-09-02 NOTE — Progress Notes (Signed)
[name] is a 61 y.o. male patient being followed by hospitalist service for abdominal pain, and other medical problems. Fredia Beets reviwed his chart and seen him at the bed side in the presence of his wife. He admits to at least 2 week hx of lower abdominal pain and constipation which his wife believes started around the time his medications were changed. He denies nausea/vomiting or urinary symptoms.  SUBJECTIVE C/o abdominal pain. Scant BM since yesterday.   No diagnosis found.  Past Medical History  Diagnosis Date  . Renal cell carcinoma     chemo and xrt--lost rt kidney  . Childhood asthma   . Sinusitis   . Cellulitis     after left foot surgery  . Osteoarthritis   . OSA (obstructive sleep apnea)     NPSG 05-08-07 AHI 63.1/hr,desat t0 80%/loud snoring  . Bipolar 1 disorder   . Hyperlipemia   . Mental disorder    Current Facility-Administered Medications  Medication Dose Route Frequency Provider Last Rate Last Dose  . acetaminophen (TYLENOL) tablet 650 mg  650 mg Oral Q6H PRN Wonda Cerise, MD      . alum & mag hydroxide-simeth (MAALOX/MYLANTA) 200-200-20 MG/5ML suspension 30 mL  30 mL Oral Q4H PRN Wonda Cerise, MD      . amLODipine (NORVASC) tablet 5 mg  5 mg Oral Daily Nichlas Pitera, MD      . bisacodyl (DULCOLAX) EC tablet 10 mg  10 mg Oral QPM Mickie D. Adams, PA   10 mg at 09/01/11 1900  . lactulose (CHRONULAC) 10 GM/15ML solution 20 g  20 g Oral TID Jahziel Sinn, MD      . magnesium citrate solution 1 Bottle  1 Bottle Oral Once Mickie D. Adams, PA   1 Bottle at 09/01/11 1859  . magnesium hydroxide (MILK OF MAGNESIA) suspension 30 mL  30 mL Oral Daily PRN Wonda Cerise, MD      . mirtazapine (REMERON) tablet 30 mg  30 mg Oral QHS Wonda Cerise, MD   30 mg at 09/01/11 2236  . mulitivitamin with minerals tablet 1 tablet  1 tablet Oral Daily Wonda Cerise, MD   1 tablet at 09/02/11 [phone]  . omega-3 acid ethyl esters (LOVAZA) capsule 1 g  1 g Oral Daily Wonda Cerise, MD   1 g at 09/02/11 0837    . polyethylene glycol (MIRALAX / GLYCOLAX) packet 17 g  17 g Oral Daily Marinda Elk, MD   17 g at 09/02/11 0836  . prazosin (MINIPRESS) capsule 2 mg  2 mg Oral QHS Wonda Cerise, MD   2 mg at 09/01/11 2236  . senna-docusate (Senokot-S) tablet 2 tablet  2 tablet Oral BID Marinda Elk, MD   2 tablet at 09/02/11 [phone]  . sorbitol 70 % solution 30 mL  30 mL Oral Daily PRN Marinda Elk, MD       No Known Allergies Principal Problem:  *Bipolar affect, depressed   Vital signs in last 24 hours: Temp:  [97 F (36.1 C)-97.4 F (36.3 C)] 97.4 F (36.3 C) (03/18 0926) Pulse Rate:  [70-75] 75  (03/18 0926) Resp:  [16] 16  (03/18 0926) BP: (143-159)/(89-100) 155/89 mmHg (03/18 0926) Weight change:     Intake/Output from previous day:   Intake/Output this shift:    Lab Results:  Basename 08/31/11 1230 08/31/11 1100  WBC 4.8 3.6*  HGB 17.8* 16.1  HCT 50.5 49.4  PLT 113* --   BMET  Basename 08/31/11 1230  08/31/11 1159  NA 136 137  K 5.1 5.2  CL 96 101  CO2 31 26  GLUCOSE 89 90  BUN 27* 28*  CREATININE 2.21* 2.14*  CALCIUM 9.6 9.4    Studies/Results: No results found.  Medications: I have reviewed the patient's current medications.   Physical exam GENERAL- alert HEAD- normal atraumatic, no neck masses, normal thyroid, no jvd RESPIRATORY- appears well, vitals normal, no respiratory distress, acyanotic, normal RR, ear and throat exam is normal, neck free of mass or lymphadenopathy, chest clear, no wheezing, crepitations, rhonchi, normal symmetric air entry CVS- regular rate and rhythm, S1, S2 normal, no murmur, click, rub or gallop ABDOMEN- abdomen is soft without significant tenderness, masses, organomegaly or guarding NEURO- some tenderness to deep palpation LLQ. No rebound or guarding. Old surgical scars. Abdominal fullness. No masses or palpable organomegaly. EXTREMITIES- extremities normal, atraumatic, no cyanosis or edema  Recommendations 1.  Abdominal pain/constipation- ? Cause. KUB unrevealing. Obtain ct abdomen/pelvis with oral contrast- no iv contrast due to renal insufficiency. Meanwhile, add lactulose, continue miralax. Check CEA in am. 2. Htn- uncontrolled. Add calcium channel blocker. 3. CKD stage 3- stable. 4. Bipolar Disorder- defer to psychiatry. Thanks for consult, we will follow with you.    Shalice Woodring 09/02/2011 1:46 PM Pager: [phone].

## 2011-09-02 NOTE — Progress Notes (Signed)
[facility] Group Notes:  (Counselor/Nursing/MHT/Case Management/Adjunct)    Type of Therapy:  Group Therapy  Participation Level:  Did Not Attend       [name] 09/02/2011  2:24 PM

## 2011-09-02 NOTE — Progress Notes (Signed)
Pt did not attend d/c planning group on this date.  SW met with pt at this time. Pt was sitting in his room with his wife at this time. SW notes pt was shaky, slow to answer questions and holding/rubbing his stomach during this interaction.  SW asked what brought pt back to the hospital, as pt just discharged from [facility] last week.  Pt and his wife explained that pt was still not sleeping at home and continues to be constipated.  Pt states that he is physically not himself and is unable to do anything. Pt's wife states before pt came to the hospital he was exercising daily for 45 minutes, eating regularly and working.  Pt's wife states that he has gotten worse in the hospital and had his medications increased.  Pt continues to complain of constipation, poor sleep, unable to swallow, unsteady on his feet and tremors.  Pt states that he doesn't feel anyone is listening to him or taking this seriously.  SW supported pt and his wife and encouraged pt to talk to the dr about these symptoms.    Reyes Ivan, LCSWA 09/02/2011  1:57 PM

## 2011-09-02 NOTE — Progress Notes (Signed)
[facility] MD Progress Note  09/02/2011 4:27 PM  Diagnosis:  Axis I: Bipolar I Disorder - Most Recent Episode - Depressed.  The patient was seen today and reports the following:   ADL's: Intact.  Sleep: The patient reports to having significant difficulty initiating and maintaining sleep.  Appetite: The patient reports a decreased appetite today.   Mild>(1-10) >Severe  Hopelessness (1-10): 10  Depression (1-10): 10  Anxiety (1-10): 10   Suicidal Ideation: The patient reports vague suicidal ideations today but denies any plan or intent.  Plan: No  Intent: No  Means: No   Homicidal Ideation: The patient denies any homicidal ideations today.  Plan: No  Intent: No.  Means: No   General Appearance/Behavior: Casual and cooperative.  Patient does have a moderate bilateral resting tremor which is worse on intent. Eye Contact: Fair to Good.  Speech: Appropriate in rate and volume.  Motor Behavior: Appropriate.  Level of Consciousness: Alert and Oriented x 3.  Mental Status: Alert and Oriented x 3.  Mood: Severely Depressed.  Affect: Essentially Flat.  Anxiety Level: Severe anxiety reported today.  Thought Process: wnl.  Thought Content: The patient denies any auditory or visual hallucinations or delusional thinking. Perception:. wnl.  Judgment: Fair.  Insight: Fair.  Cognition: Orientated to person, place and time.  Sleep:  Number of Hours: 5.75    Vital Signs:Blood pressure 154/82, pulse 80, temperature 97.9 F (36.6 C), temperature source Oral, resp. rate 16, SpO2 99.00%.  Current Medications: Current Facility-Administered Medications  Medication Dose Route Frequency Provider Last Rate Last Dose  . acetaminophen (TYLENOL) tablet 650 mg  650 mg Oral Q6H PRN Wonda Cerise, MD      . alum & mag hydroxide-simeth (MAALOX/MYLANTA) 200-200-20 MG/5ML suspension 30 mL  30 mL Oral Q4H PRN Wonda Cerise, MD      . amLODipine (NORVASC) tablet 5 mg  5 mg Oral Daily Curlene Labrum Jode Lippe, MD      .  bisacodyl (DULCOLAX) EC tablet 10 mg  10 mg Oral QPM Curlene Labrum Avagail Whittlesey, MD   10 mg at 09/01/11 1900  . divalproex (DEPAKOTE ER) 24 hr tablet 500 mg  500 mg Oral Q breakfast Curlene Labrum Christelle Igoe, MD      . divalproex (DEPAKOTE ER) 24 hr tablet 500 mg  500 mg Oral QHS Dorsey Charette D Blandon Offerdahl, MD      . lactulose (CHRONULAC) 10 GM/15ML solution 20 g  20 g Oral TID Curlene Labrum Cameran Ahmed, MD      . magnesium citrate solution 1 Bottle  1 Bottle Oral Once Mickie D. Adams, PA   1 Bottle at 09/01/11 1859  . magnesium hydroxide (MILK OF MAGNESIA) suspension 30 mL  30 mL Oral Daily PRN Wonda Cerise, MD      . mirtazapine (REMERON) tablet 30 mg  30 mg Oral QHS Curlene Labrum Xochitl Egle, MD   30 mg at 09/01/11 2236  . mulitivitamin with minerals tablet 1 tablet  1 tablet Oral Daily Ronny Bacon, MD   1 tablet at 09/02/11 [phone]  . omega-3 acid ethyl esters (LOVAZA) capsule 1 g  1 g Oral Daily Curlene Labrum Darsh Vandevoort, MD   1 g at 09/02/11 0837  . polyethylene glycol (MIRALAX / GLYCOLAX) packet 17 g  17 g Oral Daily Curlene Labrum Lajean Boese, MD   17 g at 09/02/11 0836  . prazosin (MINIPRESS) capsule 2 mg  2 mg Oral QHS Curlene Labrum Cayden Rautio, MD   2 mg at 09/01/11 2236  . senna-docusate (Senokot-S) tablet  2 tablet  2 tablet Oral BID Ronny Bacon, MD   2 tablet at 09/02/11 0836  . sertraline (ZOLOFT) tablet 50 mg  50 mg Oral Daily Curlene Labrum Aliyanah Rozas, MD      . sorbitol 70 % solution 30 mL  30 mL Oral Daily PRN Marinda Elk, MD       Lab Results:  Results for orders placed during the hospital encounter of 09/01/11 (from the past 48 hour(s))  VALPROIC ACID LEVEL     Status: Normal   Collection Time   09/01/11  7:40 PM      Component Value Range Comment   Valproic Acid Lvl 74.0  50.0 - 100.0 (ug/mL)    Time was spent today discussing with the patient the situation leading to his admission.  The patient states that he was recently discharged from [facility] but does not feel his medications are adjusted appropriately.  He reports a worsening of his bilateral  tremor as well as severe depression, anxiety and hopeless feelings.  The patient also has renal cancer as well as abdominal pain.  Treatment Plan Summary:  1. Daily contact with patient to assess and evaluate symptoms and progress in treatment  2. Medication management  3. The patient will deny suicidal ideations or homicidal ideations for 48 hours prior to discharge and have a depression and anxiety rating of 3 or less. The patient will also deny any auditory or visual hallucinations or delusional thinking.   Plan:  1. Will continue current medications. 2. Will start the medication Zoloft at 50 mgs po q am for depression. 3. Will continue the medication Remeron at 30 mgs po qhs for sleep, depression and appetite. 4. Will continue the medication Depakote ER but at the reduced dosage of 500 mgs po q am and hs.  This medication was decreased to determine if this would improve his bilateral tremor.  5. Will continue to monitor.  6. Laboratory Studies reviewed. 7. Internal Medicine is following the patient for his abdominal pain.  Pihu Basil 09/02/2011, 4:27 PM

## 2011-09-02 NOTE — Progress Notes (Signed)
(  D) Pt. Awake, alert.  Disheveled appearance. Affect: flat, Mood: depressed, blunted.  (A) Reviewed nursing plan of care.  Offered support.  (R) Pt. Denies SI/HI/AVH.  Pt. With severe tremors in all extremities.    Pt. Pushed 3 cups of water in an hour from 1600-1700.  Pt. Had dry heaves with reported 10ml of clear emesis at 2030.  Pt. Observed to have 10ml of clear emesis at 2117.  Pt. Had dry heaves at 2126.  RN placed all fluids on the nightstand instead of the rolling bedside stand, instructed him to call for staff using call bell if he wanted to take a sip.  Pt.with no further dry heaves or emesis after 2130.   RN pushed small sips of fluids q46minutes to achieve the 800cc po intake/shift per NP order.

## 2011-09-02 NOTE — Progress Notes (Signed)
Patient ID: [name], male   DOB: June 16, 1951, 61 y.o.   MRN: [id number] Spoke with [name]'s wife and reviewed his history. She is concerned about his rapid physical decline.  Prior to the previous admission he was exercising 45" daily and had a good appetite and was eating healthy.   He tried to come off the Lithium more than 12 years ago but couldn't get stabilized on anything else, and was put back on the Lithium.  Currently the Lithium was stopped  In June and he was placed on low dose Depakote initially.  He had an episode of elevated mood that included some reckless spending of money, and Abilify 5mg  was added.  After that time he began to be depressed and it was depression with suicidal thoughts that led to his previous admission last week.   [name] visited at lunch time today and feels he needs physical therapy and nutrition consult which I will order.  She is concerned about contrast medium with his abdomen/pelvis CT tomorrow because when they go to [facility] those MD's order his kidney studies without contrast.  We discussed that they are probably intending to decline the IV contrast and Dr. Venetia Constable has said that the oral contrast is not the same and he should take it because it will help improve the imaging of his GI tract.

## 2011-09-02 NOTE — Progress Notes (Signed)
Patient ID: [name], male   DOB: 1950-06-26, 61 y.o.   MRN: [id number] The patient spent the evening in bed. He continues to report feeling weak. Tremors noted. Flat, waxy facial expression. Avoids eye contact. Offered and consumed an Ensure and several cups of water. He is able to get out of bed and ambulate to the bathroom. Had a small bowel movement. States he is feeling a little more comfortable, but feels he is still constipated. Denies any suicidal ideation. C-pap applied at [facility].

## 2011-09-02 NOTE — Progress Notes (Signed)
Patient was sitting up on the side of his bed on first approach this morning.  He had a difficult time taking his pills in that he is having difficulty swallowing at this time.  Only ate about one third of his breakfast meal.  Hand tremors are much worse today than they were last week.  He needs assistance to ambulate.  He was reminded to use the call bell to ask for help getting in and out of bed.  Case was discussed with Ferdinand Lango., NP.  Patient given reassurance.

## 2011-09-02 NOTE — Progress Notes (Signed)
[facility] Group Notes:  (Counselor/Nursing/MHT/Case Management/Adjunct)  09/02/2011 11:48 AM  Type of Therapy:  Group Therapy  Participation Level:  Did Not Attend  [name] 09/02/2011, 11:48 AM

## 2011-09-03 ENCOUNTER — Ambulatory Visit (HOSPITAL_COMMUNITY)
Admit: 2011-09-03 | Discharge: 2011-09-03 | Disposition: A | Discharge status: Home / Self Care | Payer: 59 | Attending: Internal Medicine | Admitting: Internal Medicine

## 2011-09-03 DIAGNOSIS — Z905 Acquired absence of kidney: Secondary | ICD-10-CM | POA: Insufficient documentation

## 2011-09-03 DIAGNOSIS — R109 Unspecified abdominal pain: Secondary | ICD-10-CM | POA: Insufficient documentation

## 2011-09-03 DIAGNOSIS — K5900 Constipation, unspecified: Secondary | ICD-10-CM | POA: Insufficient documentation

## 2011-09-03 DIAGNOSIS — N281 Cyst of kidney, acquired: Secondary | ICD-10-CM | POA: Insufficient documentation

## 2011-09-03 LAB — COMPREHENSIVE METABOLIC PANEL
ALT: 43 U/L (ref 0–53)
Calcium: 9.3 mg/dL (ref 8.4–10.5)
Creatinine, Ser: 2.04 mg/dL — ABNORMAL HIGH (ref 0.50–1.35)
GFR calc Af Amer: 39 mL/min — ABNORMAL LOW (ref >90)
GFR calc non Af Amer: 34 mL/min — ABNORMAL LOW (ref >90)
Glucose, Bld: 100 mg/dL — ABNORMAL HIGH (ref 70–99)
Sodium: 137 mEq/L (ref 135–145)
Total Protein: 6.9 g/dL (ref 6.0–8.3)

## 2011-09-03 LAB — CBC
Hemoglobin: 15.8 g/dL (ref 13.0–17.0)
MCH: 32 pg (ref 26.0–34.0)
MCHC: 33.7 g/dL (ref 30.0–36.0)
MCV: 94.9 fL (ref 78.0–100.0)
Platelets: 110 10*3/uL — ABNORMAL LOW (ref 150–400)

## 2011-09-03 LAB — CEA: CEA: 1.5 ng/mL (ref 0.0–5.0)

## 2011-09-03 MED ORDER — ONDANSETRON 4 MG PO TBDP
4.0000 mg | ORAL_TABLET | ORAL | Status: DC | PRN
  Administered 2011-09-03: 4 mg via ORAL
  Filled 2011-09-03: qty 1

## 2011-09-03 MED ORDER — PROPRANOLOL HCL 20 MG PO TABS
20.0000 mg | ORAL_TABLET | Freq: Two times a day (BID) | ORAL | Status: DC
  Administered 2011-09-03: 20 mg via ORAL
  Filled 2011-09-03 (×4): qty 1

## 2011-09-03 MED ORDER — LACTULOSE 10 GM/15ML PO SOLN
20.0000 g | Freq: Three times a day (TID) | ORAL | Status: AC
  Administered 2011-09-04: 20 g via ORAL
  Filled 2011-09-03 (×3): qty 30

## 2011-09-03 NOTE — Progress Notes (Signed)
PT Cancellation Note  Evaluation cancelled today due to patient receiving procedure or test.  Pt scheduled for CT at [facility] around 2:30pm.  Will attempt to evaluate pt tomorrow.  Arilyn Brierley,KATHrine E 09/03/2011, 1:56 PM Pager: [phone]

## 2011-09-03 NOTE — Progress Notes (Signed)
Pt attended discharge planning group and actively participated.  Pt presents with blunted affect and mood.  Pt ranks depression and anxiety at a 8-9 today.  Pt denies SI.  Pt states that his appetite is still down and was unable to sleep well last night.  Pt states that he is going for a cat scan today.  Pt discussed wanting his medications adjusted and stabilized.  Safety planning and suicide prevention discussed.   No further needs voiced by pt at this time.    [name], LCSWA 09/03/2011  10:43 AM

## 2011-09-03 NOTE — Progress Notes (Signed)
[facility] MD Progress Note  09/03/2011 2:25 PM  Diagnosis:  Axis I: Bipolar I Disorder - Most Recent Episode - Depressed.   The patient was seen today and reports the following:   ADL's: Intact.  Sleep: The patient reports to having significant ongoing difficulty initiating and maintaining sleep.  He however does not have his home C-PAP machine and is using one from the unit. Appetite: The patient reports a decreased appetite.   Mild>(1-10) >Severe  Hopelessness (1-10): 8  Depression (1-10): 8  Anxiety (1-10): 8-9.   Suicidal Ideation: The patient denies any suicidal ideations today.  Plan: No  Intent: No  Means: No   Homicidal Ideation: The patient denies any homicidal ideations today.  Plan: No  Intent: No.  Means: No   General Appearance/Behavior: Casual and cooperative. Patient does have an ongoing moderate bilateral resting tremor.  Eye Contact: Good.  Speech: Appropriate in rate and volume.  Motor Behavior: Appropriate.  Level of Consciousness: Alert and Oriented x 3.  Mental Status: Alert and Oriented x 3.  Mood: Severely Depressed.  Affect: Essentially Flat.  Anxiety Level: Severe anxiety reported today.  Thought Process: wnl.  Thought Content: The patient denies any auditory or visual hallucinations or delusional thinking.  Perception:. wnl.  Judgment: Fair to Good.  Insight: Fair to Good.  Cognition: Orientated to person, place and time.  Sleep:  Number of Hours: 6    Vital Signs:Blood pressure 149/80, pulse 81, temperature 97.9 F (36.6 C), temperature source Oral, resp. rate 16, SpO2 94.00%.  Current Medications: Current Facility-Administered Medications  Medication Dose Route Frequency Provider Last Rate Last Dose  . acetaminophen (TYLENOL) tablet 650 mg  650 mg Oral Q6H PRN Wonda Cerise, MD      . alum & mag hydroxide-simeth (MAALOX/MYLANTA) 200-200-20 MG/5ML suspension 30 mL  30 mL Oral Q4H PRN Wonda Cerise, MD      . amLODipine (NORVASC) tablet 5 mg  5 mg  Oral Daily Curlene Labrum Varonica Siharath, MD   5 mg at 09/03/11 0910  . bisacodyl (DULCOLAX) EC tablet 10 mg  10 mg Oral QPM Curlene Labrum Corry Storie, MD   10 mg at 09/02/11 1724  . divalproex (DEPAKOTE ER) 24 hr tablet 500 mg  500 mg Oral Q breakfast Curlene Labrum Jourdon Zimmerle, MD   500 mg at 09/03/11 0910  . divalproex (DEPAKOTE ER) 24 hr tablet 500 mg  500 mg Oral QHS Curlene Labrum Julianny Milstein, MD   500 mg at 09/03/11 0003  . feeding supplement (ENSURE IMMUNE HEALTH) liquid 237 mL  237 mL Oral TID WC Viviann Spare, NP   237 mL at 09/03/11 0641  . lactulose (CHRONULAC) 10 GM/15ML solution 20 g  20 g Oral TID Ronny Bacon, MD   20 g at 09/03/11 0909  . magnesium hydroxide (MILK OF MAGNESIA) suspension 30 mL  30 mL Oral Daily PRN Wonda Cerise, MD      . mirtazapine (REMERON) tablet 30 mg  30 mg Oral QHS Curlene Labrum Rony Ratz, MD   30 mg at 09/03/11 0002  . mulitivitamin with minerals tablet 1 tablet  1 tablet Oral Daily Ronny Bacon, MD   1 tablet at 09/03/11 0910  . omega-3 acid ethyl esters (LOVAZA) capsule 1 g  1 g Oral Daily Curlene Labrum Jacora Hopkins, MD   1 g at 09/03/11 0911  . ondansetron (ZOFRAN-ODT) disintegrating tablet 4 mg  4 mg Oral Q4H PRN Viviann Spare, NP   4 mg at 09/03/11 1200  . polyethylene  glycol (MIRALAX / GLYCOLAX) packet 17 g  17 g Oral Daily Curlene Labrum Nathalee Smarr, MD   17 g at 09/03/11 0911  . prazosin (MINIPRESS) capsule 2 mg  2 mg Oral QHS Curlene Labrum Daryle Amis, MD   2 mg at 09/02/11 2304  . propranolol (INDERAL) tablet 20 mg  20 mg Oral BID Curlene Labrum Umaima Scholten, MD      . senna-docusate (Senokot-S) tablet 2 tablet  2 tablet Oral BID Ronny Bacon, MD   2 tablet at 09/03/11 0910  . sertraline (ZOLOFT) tablet 50 mg  50 mg Oral Daily Curlene Labrum Kinesha Auten, MD   50 mg at 09/03/11 0910  . sorbitol 70 % solution 30 mL  30 mL Oral Daily PRN Marinda Elk, MD       Lab Results:  Results for orders placed during the hospital encounter of 09/01/11 (from the past 48 hour(s))  VALPROIC ACID LEVEL     Status: Normal   Collection  Time   09/01/11  7:40 PM      Component Value Range Comment   Valproic Acid Lvl 74.0  50.0 - 100.0 (ug/mL)   URINALYSIS, ROUTINE W REFLEX MICROSCOPIC     Status: Normal   Collection Time   09/02/11  3:11 PM      Component Value Range Comment   Color, Urine YELLOW  YELLOW     APPearance CLEAR  CLEAR     Specific Gravity, Urine 1.024  1.005 - 1.030     pH 6.5  5.0 - 8.0     Glucose, UA NEGATIVE  NEGATIVE (mg/dL)    Hgb urine dipstick NEGATIVE  NEGATIVE     Bilirubin Urine NEGATIVE  NEGATIVE     Ketones, ur NEGATIVE  NEGATIVE (mg/dL)    Protein, ur NEGATIVE  NEGATIVE (mg/dL)    Urobilinogen, UA 0.2  0.0 - 1.0 (mg/dL)    Nitrite NEGATIVE  NEGATIVE     Leukocytes, UA NEGATIVE  NEGATIVE  MICROSCOPIC NOT DONE ON URINES WITH NEGATIVE PROTEIN, BLOOD, LEUKOCYTES, NITRITE, OR GLUCOSE <1000 mg/dL.  CBC     Status: Abnormal   Collection Time   09/03/11  6:20 AM      Component Value Range Comment   WBC 6.2  4.0 - 10.5 (K/uL)    RBC 4.94  4.22 - 5.81 (MIL/uL)    Hemoglobin 15.8  13.0 - 17.0 (g/dL)    HCT 16.1  09.6 - 04.5 (%)    MCV 94.9  78.0 - 100.0 (fL)    MCH 32.0  26.0 - 34.0 (pg)    MCHC 33.7  30.0 - 36.0 (g/dL)    RDW 40.9  81.1 - 91.4 (%)    Platelets 110 (*) 150 - 400 (K/uL)   COMPREHENSIVE METABOLIC PANEL     Status: Abnormal   Collection Time   09/03/11  6:20 AM      Component Value Range Comment   Sodium 137  135 - 145 (mEq/L)    Potassium 4.6  3.5 - 5.1 (mEq/L)    Chloride 99  96 - 112 (mEq/L)    CO2 31  19 - 32 (mEq/L)    Glucose, Bld 100 (*) 70 - 99 (mg/dL)    BUN 25 (*) 6 - 23 (mg/dL)    Creatinine, Ser 7.82 (*) 0.50 - 1.35 (mg/dL)    Calcium 9.3  8.4 - 10.5 (mg/dL)    Total Protein 6.9  6.0 - 8.3 (g/dL)    Albumin 3.6  3.5 -  5.2 (g/dL)    AST 32  0 - 37 (U/L)    ALT 43  0 - 53 (U/L)    Alkaline Phosphatase 61  39 - 117 (U/L)    Total Bilirubin 0.5  0.3 - 1.2 (mg/dL)    GFR calc non Af Amer 34 (*) >90 (mL/min)    GFR calc Af Amer 39 (*) >90 (mL/min)   CEA     Status:  Normal   Collection Time   09/03/11  6:20 AM      Component Value Range Comment   CEA 1.5  0.0 - 5.0 (ng/mL)    Time was spent today discussing with the patient his current symptoms.  The patient reports some improvement with his depressive symptoms.  He however reports ongoing difficulty with sleep and states he does not have his home C-PAP at [facility].  He reports that the one at [facility] is too loud.  Efforts will be made to locate another C-PAP for the patient.  Treatment Plan Summary:  1. Daily contact with patient to assess and evaluate symptoms and progress in treatment  2. Medication management  3. The patient will deny suicidal ideations or homicidal ideations for 48 hours prior to discharge and have a depression and anxiety rating of 3 or less. The patient will also deny any auditory or visual hallucinations or delusional thinking.   Plan:  1. Will continue current medications.  2. Will start the medication Inderal 20 mgs po BID at 8 am and 5 pm to address his tremor. 3. Will continue to monitor.  4. Laboratory Studies reviewed.  5. Internal Medicine is following the patient for his abdominal pain. 6. CT with contrast is scheduled for today to evaluate the patient's complaint of abdominal pain.  Aava Deland 09/03/2011, 2:25 PM

## 2011-09-03 NOTE — Progress Notes (Signed)
Patient Discharge Instructions:  Psychiatric Admission Assessment Note Provided,  09/03/2011 Discharge Summary Note Provided,   09/03/2011 After Visit Summary (AVS) Provided,  09/03/2011 Face Sheet Provided, 09/03/2011 Faxed/Sent to the Next Level Care provider:  09/03/2011 Next Level Care Provider Has Access to the EMR, 09/03/2011  Records available to [facility] I/P Adult via [facility]/Epic Access.  Wandra Scot, 09/03/2011, 3:16 PM

## 2011-09-03 NOTE — Progress Notes (Signed)
Patient awaiting ct abdomen/pelvis, continues to c/o abdominal dsicomfort. He vomitted some of the oral cotrast this morning. I believe we can proceed with ct with some oral contrast, if that is all [name] can do.    1. Bipolar I disorder, most recent episode (or current) depressed     Past Medical History  Diagnosis Date  . Renal cell carcinoma     chemo and xrt--lost rt kidney  . Childhood asthma   . Sinusitis   . Cellulitis     after left foot surgery  . Osteoarthritis   . OSA (obstructive sleep apnea)     NPSG 05-08-07 AHI 63.1/hr,desat t0 80%/loud snoring  . Bipolar 1 disorder   . Hyperlipemia   . Mental disorder    Current Facility-Administered Medications  Medication Dose Route Frequency Provider Last Rate Last Dose  . acetaminophen (TYLENOL) tablet 650 mg  650 mg Oral Q6H PRN Wonda Cerise, MD      . alum & mag hydroxide-simeth (MAALOX/MYLANTA) 200-200-20 MG/5ML suspension 30 mL  30 mL Oral Q4H PRN Wonda Cerise, MD      . amLODipine (NORVASC) tablet 5 mg  5 mg Oral Daily Curlene Labrum Readling, MD   5 mg at 09/03/11 0910  . bisacodyl (DULCOLAX) EC tablet 10 mg  10 mg Oral QPM Curlene Labrum Readling, MD   10 mg at 09/02/11 1724  . divalproex (DEPAKOTE ER) 24 hr tablet 500 mg  500 mg Oral Q breakfast Curlene Labrum Readling, MD   500 mg at 09/03/11 0910  . divalproex (DEPAKOTE ER) 24 hr tablet 500 mg  500 mg Oral QHS Curlene Labrum Readling, MD   500 mg at 09/03/11 0003  . feeding supplement (ENSURE IMMUNE HEALTH) liquid 237 mL  237 mL Oral TID WC Viviann Spare, NP   237 mL at 09/03/11 0641  . lactulose (CHRONULAC) 10 GM/15ML solution 20 g  20 g Oral TID Ronny Bacon, MD   20 g at 09/03/11 0909  . magnesium hydroxide (MILK OF MAGNESIA) suspension 30 mL  30 mL Oral Daily PRN Wonda Cerise, MD      . mirtazapine (REMERON) tablet 30 mg  30 mg Oral QHS Curlene Labrum Readling, MD   30 mg at 09/03/11 0002  . mulitivitamin with minerals tablet 1 tablet  1 tablet Oral Daily Ronny Bacon, MD   1 tablet at  09/03/11 0910  . omega-3 acid ethyl esters (LOVAZA) capsule 1 g  1 g Oral Daily Curlene Labrum Readling, MD   1 g at 09/03/11 0911  . ondansetron (ZOFRAN-ODT) disintegrating tablet 4 mg  4 mg Oral Q4H PRN Viviann Spare, NP   4 mg at 09/03/11 1200  . polyethylene glycol (MIRALAX / GLYCOLAX) packet 17 g  17 g Oral Daily Curlene Labrum Readling, MD   17 g at 09/03/11 0911  . prazosin (MINIPRESS) capsule 2 mg  2 mg Oral QHS Curlene Labrum Readling, MD   2 mg at 09/02/11 2304  . senna-docusate (Senokot-S) tablet 2 tablet  2 tablet Oral BID Ronny Bacon, MD   2 tablet at 09/03/11 0910  . sertraline (ZOLOFT) tablet 50 mg  50 mg Oral Daily Curlene Labrum Readling, MD   50 mg at 09/03/11 0910  . sorbitol 70 % solution 30 mL  30 mL Oral Daily PRN Marinda Elk, MD       No Known Allergies Principal Problem:  *Bipolar I disorder, most recent episode (or current) depressed  Vital signs in last 24 hours: Temp:  [97.9 F (36.6 C)] 97.9 F (36.6 C) (03/19 0809) Pulse Rate:  [62-90] 81  (03/19 0809) Resp:  [16] 16  (03/19 0809) BP: (117-185)/(72-92) 149/80 mmHg (03/19 0809) SpO2:  [94 %] 94 % (03/19 0809) Weight change:     Intake/Output from previous day: 03/18 0701 - 03/19 0700 In: 800 [P.O.:800] Out: 20 [Emesis/NG output:20] Intake/Output this shift:    Lab Results:  [facility] 09/03/11 0620  WBC 6.2  HGB 15.8  HCT 46.9  PLT 110*   BMET  Basename 09/03/11 0620  NA 137  K 4.6  CL 99  CO2 31  GLUCOSE 100*  BUN 25*  CREATININE 2.04*  CALCIUM 9.3    Studies/Results: No results found.  Medications: I have reviewed the patient's current medications.   Physical exam GENERAL- alert HEAD- normal atraumatic, no neck masses, normal thyroid, no jvd RESPIRATORY- appears well, vitals normal, no respiratory distress, acyanotic, normal RR, ear and throat exam is normal, neck free of mass or lymphadenopathy, chest clear, no wheezing, crepitations, rhonchi, normal symmetric air entry CVS- regular  rate and rhythm, S1, S2 normal, no murmur, click, rub or gallop ABDOMEN- abdominal fullness. No acute changes from yesterday. NEURO- Grossly normal EXTREMITIES- extremities normal, atraumatic, no cyanosis or edema  Recommendations  1. Abdominal pain/constipation- ? Cause. KUB unrevealing. Obtain ct abdomen/pelvis with oral contrast- no iv contrast due to renal insufficiency. Meanwhile,  continue miralax.  CEA normal.  2. Htn- uncontrolled but seems improving. Continue calcium channel blocker.  3. CKD stage 3- stable.  4. Bipolar Disorder- defer to psychiatry.  Thanks for consult, we will follow with you.     [name] 09/03/2011 12:31 PM Pager: [phone].

## 2011-09-03 NOTE — Progress Notes (Signed)
[facility] Group Notes: (Counselor/Nursing/MHT/Case Management/Adjunct) 09/03/2011   @  11:00am   Type of Therapy:  Group Therapy  Participation Level:  Did Not Attend    [name] 09/03/2011 2:48 PM

## 2011-09-03 NOTE — Progress Notes (Signed)
Nutrition Brief Note  - Attempted to meet with pt, however pt currently at [facility] for CT of abdomen/pelvis. Will attempt to meet with pt again as schedule permits.   Dietitian # [phone]

## 2011-09-03 NOTE — Progress Notes (Signed)
[facility] Group Notes:  (Counselor/Nursing/MHT/Case Management/Adjunct)  09/03/2011 11:57 AM  Type of Therapy:  Psychoeducational Skills  Participation Level:  Did Not Attend   Summary of Progress/Problems: [name] did not attend psycho-education group on Labels.   Wandra Scot 09/03/2011, 11:57 AM

## 2011-09-03 NOTE — Progress Notes (Signed)
[name] continues to complain of difficulty swallowing, states he feels his throat is constricting.  Went for CT this afternoon.  Was able to drink about 3/4 of his contrast prior to leaving.  Is drinking some, but seems to do better with thicker liquids.  Took a shower this evening and put on clean clothing.  Continues to be slow to respond at times, but conversation is appropriate and patient appears a little less depressed today, but rates depression as a 10 on his self-inventory sheet.  Wife has been in to see him at meal times.  Has had some questions about medications which I was able to answer for her.  Patient was not able to swallow the 500 mg Depakote ER this morning and pharmacist was able to provide [place] with 2 250 mg tabs which patient was able to get down without any problem.

## 2011-09-04 DIAGNOSIS — K5900 Constipation, unspecified: Secondary | ICD-10-CM | POA: Diagnosis present

## 2011-09-04 MED ORDER — MIRTAZAPINE 30 MG PO TBDP
30.0000 mg | ORAL_TABLET | Freq: Every day | ORAL | Status: DC
  Administered 2011-09-04 – 2011-09-05 (×2): 30 mg via ORAL
  Filled 2011-09-04 (×6): qty 1

## 2011-09-04 MED ORDER — BISACODYL 10 MG RE SUPP
10.0000 mg | Freq: Once | RECTAL | Status: AC
  Administered 2011-09-04: 10 mg via RECTAL
  Filled 2011-09-04: qty 1

## 2011-09-04 MED ORDER — SERTRALINE HCL 20 MG/ML PO CONC
50.0000 mg | Freq: Every day | ORAL | Status: DC
Start: 2011-09-05 — End: 2011-09-05
  Administered 2011-09-05: 50 mg via ORAL
  Filled 2011-09-04 (×2): qty 2.5

## 2011-09-04 MED ORDER — VALPROIC ACID 250 MG/5ML PO SYRP
500.0000 mg | ORAL_SOLUTION | Freq: Every day | ORAL | Status: DC
  Administered 2011-09-04 – 2011-09-05 (×2): 500 mg via ORAL
  Filled 2011-09-04 (×6): qty 10

## 2011-09-04 MED ORDER — VALPROIC ACID 250 MG/5ML PO SYRP
250.0000 mg | ORAL_SOLUTION | Freq: Two times a day (BID) | ORAL | Status: DC
  Administered 2011-09-04 – 2011-09-06 (×4): 250 mg via ORAL
  Filled 2011-09-04 (×9): qty 5

## 2011-09-04 MED ORDER — OLANZAPINE 5 MG PO TBDP
5.0000 mg | ORAL_TABLET | Freq: Every day | ORAL | Status: DC
  Administered 2011-09-04 – 2011-09-05 (×2): 5 mg via ORAL
  Filled 2011-09-04 (×5): qty 1

## 2011-09-04 MED ORDER — ANIMAL SHAPES WITH C & FA PO CHEW
1.0000 | CHEWABLE_TABLET | Freq: Every day | ORAL | Status: DC
  Administered 2011-09-05 – 2011-09-06 (×2): 1 via ORAL
  Filled 2011-09-04 (×4): qty 1

## 2011-09-04 NOTE — Progress Notes (Signed)
Pt rates depression,anxiety, and hopelessness at a 9. Pt has not attended any groups. Pt states he is having problems swallowing pills and is now even having problems swallowing liquids due to he feels that his throat is constricting. Pt was offered support and encouragement. Pt also is on I and O's. Pt safety is maintained on unit.

## 2011-09-04 NOTE — Progress Notes (Signed)
PT Cancellation Note  Evaluation cancelled today due to patient's refusal to participate.  Pt sitting EOB upon entering room.  Pt reported he did not want to perform any mobility today.  Attempted to ask pt questions with occasional yes/no responses and then pt stopped answering completely.  Told pt that nurse would check in on him and alerted nurse to pt not participating.     Elexus Barman,KATHrine E 09/04/2011, 1:09 PM Pager: [phone]

## 2011-09-04 NOTE — Plan of Care (Signed)
Problem: Food- and Nutrition-Related Knowledge Deficit (NB-1.1) Goal: Nutrition education Formal process to instruct or train a patient/client in a skill or to impart knowledge to help patients/clients voluntarily manage or modify food choices and eating behavior to maintain or improve health.  Outcome: Completed/Met Date Met:  09/04/11 Height 6\' 1"  (1.854 m) Weight 93.4 kg (206lb) BMI = 27.2, overweight  Estimated nutritional needs: 1850-2350 calories, 95-115g protein  - Met with pt who reports poor appetite for the past weeks r/t constipation. Pt states he is unsure if he has been losing weight. Pt reports difficulty swallowing liquids and that this is a new and worsening problem. Pt told NP yesterday that he had problems swallowing solids, which pt denied problem with today. Pt had CT of abdomen/pelvis yesterday which showed constipation. Pt has not had a BM since then. RN reports pt ate 50% of breakfast and lunch and drank 100% of 1 Ensure. Discussed high fiber foods to help with constipation. Encouraged pt to eat despite lack of appetite to make sure pt does not get any weaker as pt c/o weakness. Encouraged pt to continue to drink Ensure. Dicussed pt with NP and recommend SLP evaluation to r/o dysphagia. Pt without any further nutrition educational needs.

## 2011-09-04 NOTE — Progress Notes (Signed)
Pt did not attend d/c planning group on this date.  SW met with pt individually at this time.  Pt was resting in the bed peacefully but began to tremor when he heard SW greet him.  Pt continued to have the tremors during this interaction.  Pt states that he continues to not feel good today.  Pt states that he is not sleeping well and not eating well still.  Pt ranks depression and anxiety at a 9-10 today.  Pt denies SI.  SW speaks with pt's wife on a daily basis, as pt's wife calls multiple times throughout the day for an update on pt.  No further needs voiced by pt at this time.   Reyes Ivan, LCSWA 09/04/2011  9:59 AM

## 2011-09-04 NOTE — Progress Notes (Signed)
Patient ID: [name], male   DOB: 09/25/1950, 61 y.o.   MRN: [id number] Pt complained that his throat is getting "more and more restrictive".  According to the pt, this has been going on for several days. Stated that's the reason for his acid reflux, pointing to his chest. Writer informed the pt that his concerns would be forwarded. Pt's tremors seem to have increased from previous days, and required help to drink mylanta that had been left for him. Also the writer was able to find another CPAP for the pt that was more "quiet". Support and encouragement was offered.

## 2011-09-04 NOTE — Progress Notes (Signed)
[facility] Group Notes:  (Counselor/Nursing/MHT/Case Management/Adjunct)  09/04/2011 11:51 AM  Type of Therapy:  Group Therapy  Participation Level:  Did Not Attend  Luanna Cole 09/04/2011, 11:51 AM

## 2011-09-04 NOTE — Progress Notes (Signed)
Patient ID: [name], male   DOB: 08-06-1950, 61 y.o.   MRN: [id number]  [name] is in the bed, disheveled and unwashed.  Says he had a bath yesterday, but not today.  Reports he drank his Ensure this morning, but could not eat much solid food.  Says his mouth is dry so I get him a fresh pitcher of ice water and he has no difficulty taking several drinks of this.    He reports he is not in pain but uncomfortable from constipation that has been going on for several days. He has had no BM today.  He reports his energy is very low. He reports a lot of difficulty sleeping at night, this includes difficulty initiating sleep and maintaining sleep. Rates his depression a 9-10/10.  Denies suicidal thoughts.  He refused his physical therapy consult yesterday ' I didn't feel up to it'. And he was in CT scan when the nutritionist tried to interview him so she will come back.   [name] says today "I'm just all messed up." "I just let this stuff get too far. "  Objective: Fully alert male, absent eye contact. Long response latency. Difficulty organizing his thoughts. Speech is minimal production, soft in tone, barely audible at times, and to her responses with choppy phrases in one or 2 words. Denying suicidal thoughts.  I have contacted internal medicine who will see him today to followup on the issues of constipation, and his abdominal CT scan which showed stool in the ascending colon, and no evidence of intestinal blockage.  At 12 noon his wife came over the lunch hour and I met with her and [name] together. She is asking if he can not go on a low dose of lithium at this time, and I advised her that we felt it was the safest to try other alternatives. [name] is complaining that he feels he has difficulty swallowing medications and this morning refuses Depakote, his Zoloft, and is Inderal, and Norvasc.  A: BiPolar I depressed state. 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  His wife is very concerned about his current level of depression.   Plan: Discussed with Peggye Fothergill PharmD., and Dr. Franchot Gallo MD and we discussed a plan as follows: Will change Zoloft and Depakote to liquid preparations to facilitate swallowing. Stop the propranolol and depresses him. Start Zyprexa Zydis 5 mg each bedtime and 2.5 mg every morning.  I placed a call to his primary psychiatrist Dr. Archer Asa and am awaiting return call, to review his past history and discuss the course of treatment.  I spoke today with Dr. Earl Lites, his primary care practitioner who had been contacted by his wife. We discussed [name]'s past history and to Dr. Cleta Alberts requesting to speak with Dr. Donell Beers.

## 2011-09-04 NOTE — Progress Notes (Signed)
D: Pt didn't attend group this evening.  Pt interacted with peers in dayroom this evening.  A:  Encouraged interactions with peers and staying in dayroom.  R:  Pt is safe. Aundria Rud, Shaye Elling L, MHT/NS

## 2011-09-04 NOTE — Telephone Encounter (Signed)
Pt wife Bonita Quin calling and would like a call from Dr Cleta Alberts she did not want to go into detail over the phone with me she specifically would like to speak with Dr Cleta Alberts

## 2011-09-04 NOTE — Progress Notes (Signed)
DAILY PROGRESS NOTE                              GENERAL INTERNAL MEDICINE [facility]  SUBJECTIVE: Abdominal pain is better, no bowel movements in the past 2 days.  OBJECTIVE: BP 152/80  Pulse 61  Temp(Src) 97.2 F (36.2 C) (Oral)  Resp 20  SpO2 93% No intake or output data in the 24 hours ending 09/04/11 1421                    Weight change:  Physical Exam: General: Alert and awake oriented x3 not in any acute distress. HEENT: anicteric sclera, pupils equal reactive to light and accommodation CVS: S1-S2 heard, no murmur rubs or gallops Chest: clear to auscultation bilaterally, no wheezing rales or rhonchi Abdomen:  normal bowel sounds, soft, nontender, nondistended, no organomegaly Neuro: Cranial nerves II-XII intact, no focal neurological deficits Extremities: no cyanosis, no clubbing or edema noted bilaterally   Lab Results:  [facility] 09/03/11 0620  NA 137  K 4.6  CL 99  CO2 31  GLUCOSE 100*  BUN 25*  CREATININE 2.04*  CALCIUM 9.3  MG --  PHOS --    Basename 09/03/11 0620  AST 32  ALT 43  ALKPHOS 61  BILITOT 0.5  PROT 6.9  ALBUMIN 3.6   Basename 09/03/11 0620  WBC 6.2  NEUTROABS --  HGB 15.8  HCT 46.9  MCV 94.9  PLT 110*   Micro Results: No results found for this or any previous visit (from the past 240 hour(s)).  Studies/Results: Ct Abdomen Pelvis Wo Contrast  09/03/2011  *RADIOLOGY REPORT*  Clinical Data: Abdominal pain, constipation  CT ABDOMEN AND PELVIS WITHOUT CONTRAST  Technique:  Multidetector CT imaging of the abdomen and pelvis was performed following the standard protocol without intravenous contrast.  Comparison: None.  Findings: Lung bases are clear.  Non-IV contrast images demonstrate no focal hepatic lesion. Gallbladder is surgically absent.  The pancreas, spleen, adrenal glands, and left kidney are normal.  Small simple cyst extending from the left kidney.  No hydronephrosis.  Prior right nephrectomy.  No nodularity in  nephrectomy bed.  The stomach, small bowel and cecum are normal.  The cecum extends anterior and is midline.  Appendix is normal.  There is a moderate volume stool in the ascending colon.  Ascending colon is tortuous. Transverse colon is gas filled.  The descending colon is fluid- filled.  There is stool in the rectum.  Abdominal aorta normal caliber.  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  No free fluid the pelvis.  The prostate gland and bladder normal. No pelvic lymphadenopathy.  Review of  bone windows demonstrates no aggressive osseous lesions.  IMPRESSION:  1.  Moderate volume stool in tortuous ascending colon suggest constipation.  No obstructing lesions identified.  The cecum has a midline anterior position. 2.  No evidence of  bowel obstruction. 3.  No additional acute findings on this noncontrast exam.  Original Report Authenticated By: Genevive Bi, M.D.   Dg Abd Acute W/chest  08/31/2011  OVERREAD BY [facility] *RADIOLOGY REPORT*  Clinical Data: Periumbilical pain  ACUTE ABDOMEN SERIES (ABDOMEN 2 VIEW & CHEST 1 VIEW)  Comparison: None.  Findings: Normal cardiac silhouette.  Lungs are clear.  No free air beneath hemidiaphragms.  No dilated loops of large or small bowel.  Small amount gas the rectum.  No pathologic calcifications.  Multiple surgical clips  the right abdomen.  IMPRESSION:  1.  Clear chest. 2.  No intraperitoneal free air. 3.  No bowel obstruction.  Original Report Authenticated By: Genevive Bi, M.D.   Medications: Scheduled Meds:    . amLODipine  5 mg Oral Daily  . bisacodyl  10 mg Oral QPM  . feeding supplement  237 mL Oral TID WC  . lactulose  20 g Oral TID  . lactulose  20 g Oral TID  . mirtazapine  30 mg Oral QHS  . multivitamin animal shapes (with Ca/FA)  1 tablet Oral Daily  . polyethylene glycol  17 g Oral Daily  . senna-docusate  2 tablet Oral BID  . sertraline  50 mg Oral Daily  . Valproic Acid  250 mg Oral BID   And  . Valproic Acid  500 mg Oral  QHS  . DISCONTD: divalproex  500 mg Oral Q breakfast  . DISCONTD: divalproex  500 mg Oral QHS  . DISCONTD: mirtazapine  30 mg Oral QHS  . DISCONTD: mulitivitamin with minerals  1 tablet Oral Daily  . DISCONTD: omega-3 acid ethyl esters  1 g Oral Daily  . DISCONTD: prazosin  2 mg Oral QHS  . DISCONTD: propranolol  20 mg Oral BID  . DISCONTD: sertraline  50 mg Oral Daily   Continuous Infusions:  PRN Meds:.alum & mag hydroxide-simeth, magnesium hydroxide, ondansetron, sorbitol, DISCONTD: acetaminophen  ASSESSMENT & PLAN: Principal Problem:  *Bipolar I disorder, most recent episode (or current) depressed Active Problems:  Constipation  Abdominal pain -CT scan of abdomen was negative for any abnormalities, except for the constipation. -No changes clinically since yesterday.  Constipation -Patient is on MiraLax, Senokot-S, Dulcolax and lactulose started yesterday, still no success. -I will try Dulcolax suppositories and enemas if needed.  CKD stage III -At baseline  Bipolar disorder -Management per primary psychiatry team.   LOS: 3 days   Wah Sabic A 09/04/2011, 2:21 PM

## 2011-09-04 NOTE — Progress Notes (Signed)
[facility] Group Notes:  (Counselor/Nursing/MHT/Case Management/Adjunct) 1:15pm   Type of Therapy:  Group Therapy  Participation Level:  Did Not Attend       [name] 09/04/2011  2:37 PM

## 2011-09-04 NOTE — Progress Notes (Signed)
Pt has been tremoring all morning and can not hold cups in hand or help himself up when trying to move in bed. Pt has not attended any groups this morning due to being unsteady on his feet and being in pain.  Pt appears to be in discomfort and pain constantly. This writer continues to offer support and checking on pt every 15 minutes. Pt appears to be regressing in treatment.

## 2011-09-05 MED ORDER — LITHIUM CARBONATE ER 300 MG PO TBCR
300.0000 mg | EXTENDED_RELEASE_TABLET | Freq: Every day | ORAL | Status: DC
  Administered 2011-09-05: 300 mg via ORAL
  Filled 2011-09-05 (×5): qty 1

## 2011-09-05 MED ORDER — SERTRALINE HCL 20 MG/ML PO CONC
100.0000 mg | Freq: Every day | ORAL | Status: DC
  Administered 2011-09-06: 100 mg via ORAL
  Filled 2011-09-05 (×3): qty 5

## 2011-09-05 MED ORDER — FLEET ENEMA 7-19 GM/118ML RE ENEM
1.0000 | ENEMA | Freq: Once | RECTAL | Status: AC
  Administered 2011-09-05: 1 via RECTAL
  Filled 2011-09-05 (×2): qty 1

## 2011-09-05 MED ORDER — PROPRANOLOL HCL 20 MG PO TABS
20.0000 mg | ORAL_TABLET | Freq: Two times a day (BID) | ORAL | Status: DC
  Administered 2011-09-05 – 2011-09-06 (×2): 20 mg via ORAL
  Filled 2011-09-05 (×7): qty 1

## 2011-09-05 NOTE — Progress Notes (Signed)
PT Cancellation Note  Evaluation cancelled today due to patient's refusal to participate.  Called ahead to RN to see if pt would participate today, and pt refuses.  Delisa Finck,KATHrine E 09/05/2011, 1:13 PM Pager: [phone]

## 2011-09-05 NOTE — Progress Notes (Signed)
[facility] Group Notes:  (Counselor/Nursing/MHT/Case Management/Adjunct)    Type of Therapy:  Group Therapy  Participation Level:  Did Not Attend       Billie Lade 09/05/2011  12:11 PM

## 2011-09-05 NOTE — Progress Notes (Signed)
[facility] MD Progress Note  09/05/2011 9:54 AM  Diagnosis:  Axis I: Bipolar I Disorder - Most Recent Episode - Depressed.   The patient was seen today and reports the following:   ADL's: Intact.  Sleep: The patient reports to having significant ongoing difficulty initiating and maintaining sleep.  Appetite: The patient reports a decreased appetite.   Mild>(1-10) >Severe  Hopelessness (1-10): 10  Depression (1-10): 10  Anxiety (1-10): 10   Suicidal Ideation: The patient denies any suicidal ideations today but it did take the patient some time to answer this question.  Plan: No  Intent: No  Means: No   Homicidal Ideation: The patient denies any homicidal ideations today.  Plan: No  Intent: No.  Means: No   General Appearance/Behavior: Casual and cooperative. Patient does have an ongoing moderate bilateral resting tremor.  Eye Contact: Good.  Speech: Appropriate in rate and volume but with very little spontaneous speech.  Motor Behavior: Appropriate.  Level of Consciousness: Alert and Oriented x 3.  Mental Status: Alert and Oriented x 3.  Mood: Severely Depressed.  Affect: Essentially Flat.  Anxiety Level: Severe anxiety reported today.  Thought Process: wnl.  Thought Content: The patient denies any auditory or visual hallucinations or delusional thinking.  Perception:. wnl.  Judgment: Fair to Good.  Insight: Fair to Good.  Cognition: Orientated to person, place and time.  Sleep:  Number of Hours: 6    Vital Signs:Blood pressure 158/98, pulse 62, temperature 97.5 F (36.4 C), temperature source Oral, resp. rate 18, SpO2 97.00%.  Current Medications: Current Facility-Administered Medications  Medication Dose Route Frequency Provider Last Rate Last Dose  . alum & mag hydroxide-simeth (MAALOX/MYLANTA) 200-200-20 MG/5ML suspension 30 mL  30 mL Oral Q4H PRN Wonda Cerise, MD   30 mL at 09/03/11 1904  . amLODipine (NORVASC) tablet 5 mg  5 mg Oral Daily Curlene Labrum Jaylynne Birkhead, MD   5 mg at  09/05/11 0856  . feeding supplement (ENSURE IMMUNE HEALTH) liquid 237 mL  237 mL Oral TID WC Curlene Labrum Lashawnda Hancox, MD      . lactulose (CHRONULAC) 10 GM/15ML solution 20 g  20 g Oral TID Simbiso Ranga, MD   20 g at 09/04/11 1750  . magnesium hydroxide (MILK OF MAGNESIA) suspension 30 mL  30 mL Oral Daily PRN Wonda Cerise, MD      . mirtazapine (REMERON SOL-TAB) disintegrating tablet 30 mg  30 mg Oral QHS Curlene Labrum Eddison Searls, MD   30 mg at 09/04/11 2136  . multivitamin animal shapes (with Ca/FA) chewable tablet 1 tablet  1 tablet Oral Daily Ronny Bacon, MD   1 tablet at 09/05/11 0857  . OLANZapine zydis (ZYPREXA) disintegrating tablet 5 mg  5 mg Oral QHS Curlene Labrum Kenny Rea, MD   5 mg at 09/04/11 2137  . ondansetron (ZOFRAN-ODT) disintegrating tablet 4 mg  4 mg Oral Q4H PRN Viviann Spare, NP   4 mg at 09/03/11 1200  . polyethylene glycol (MIRALAX / GLYCOLAX) packet 17 g  17 g Oral Daily Curlene Labrum Yola Paradiso, MD   17 g at 09/05/11 0856  . propranolol (INDERAL) tablet 20 mg  20 mg Oral BID Curlene Labrum Tamara Kenyon, MD      . senna-docusate (Senokot-S) tablet 2 tablet  2 tablet Oral BID Ronny Bacon, MD   2 tablet at 09/05/11 0856  . sertraline (ZOLOFT) 20 MG/ML concentrated solution 100 mg  100 mg Oral Daily Ronny Bacon, MD      . sorbitol  70 % solution 30 mL  30 mL Oral Daily PRN Marinda Elk, MD   30 mL at 09/04/11 2326  . Valproic Acid (DEPAKENE) 250 MG/5ML syrup SYRP 250 mg  250 mg Oral BID Curlene Labrum Kyshon Tolliver, MD   250 mg at 09/05/11 0857   And  . Valproic Acid (DEPAKENE) 250 MG/5ML syrup SYRP 500 mg  500 mg Oral QHS Curlene Labrum Jenifer Struve, MD   500 mg at 09/04/11 2129  . DISCONTD: acetaminophen (TYLENOL) tablet 650 mg  650 mg Oral Q6H PRN Wonda Cerise, MD      . DISCONTD: bisacodyl (DULCOLAX) EC tablet 10 mg  10 mg Oral QPM Curlene Labrum Rathana Viveros, MD   10 mg at 09/03/11 1750  . DISCONTD: divalproex (DEPAKOTE ER) 24 hr tablet 500 mg  500 mg Oral Q breakfast Curlene Labrum Alice Vitelli, MD   500 mg at 09/03/11 0910  .  DISCONTD: divalproex (DEPAKOTE ER) 24 hr tablet 500 mg  500 mg Oral QHS Curlene Labrum Adriauna Campton, MD   500 mg at 09/03/11 2339  . DISCONTD: mirtazapine (REMERON) tablet 30 mg  30 mg Oral QHS Curlene Labrum Jilberto Vanderwall, MD   30 mg at 09/03/11 2339  . DISCONTD: mulitivitamin with minerals tablet 1 tablet  1 tablet Oral Daily Ronny Bacon, MD   1 tablet at 09/03/11 0910  . DISCONTD: omega-3 acid ethyl esters (LOVAZA) capsule 1 g  1 g Oral Daily Curlene Labrum Emanual Lamountain, MD   1 g at 09/03/11 0911  . DISCONTD: prazosin (MINIPRESS) capsule 2 mg  2 mg Oral QHS Curlene Labrum Nikolis Berent, MD   2 mg at 09/03/11 2339  . DISCONTD: propranolol (INDERAL) tablet 20 mg  20 mg Oral BID Curlene Labrum Kayann Maj, MD   20 mg at 09/03/11 1751  . DISCONTD: sertraline (ZOLOFT) 20 MG/ML concentrated solution 50 mg  50 mg Oral Daily Curlene Labrum Delcie Ruppert, MD   50 mg at 09/05/11 0857  . DISCONTD: sertraline (ZOLOFT) tablet 50 mg  50 mg Oral Daily Curlene Labrum Mazzy Santarelli, MD   50 mg at 09/03/11 6045   Lab Results: No results found for this or any previous visit (from the past 48 hour(s)).  Time was spent today discussing with the patient his current symptoms. The patient reports ongoing severe depression and anxiety.  He reports to sleeping "maybe a little better" but with an ongoing decreased appetite.  The patient states that he has been depressed to the same degree for approximately 2 months and is severely hopeless about his situation.  Time was spend processing the patient's feelings and providing supportive therapy.  Treatment Plan Summary:  1. Daily contact with patient to assess and evaluate symptoms and progress in treatment  2. Medication management  3. The patient will deny suicidal ideations or homicidal ideations for 48 hours prior to discharge and have a depression and anxiety rating of 3 or less. The patient will also deny any auditory or visual hallucinations or delusional thinking.   Plan:  1. Will continue current medications.  2. Will restart the  medication Inderal 20 mgs po BID at 8 am and 5 pm to address his tremor.  3. Will increase the medication Zoloft to 100 mgs po q am for depression. 4. Will continue to monitor.  5. Laboratory Studies reviewed.  6. Will order a serum Depakote level for tonight. 7. Internal Medicine is following the patient for his abdominal pain.   Jaunice Mirza 09/05/2011, 9:54 AM

## 2011-09-05 NOTE — Progress Notes (Signed)
DAILY PROGRESS NOTE                              GENERAL INTERNAL MEDICINE [facility]  SUBJECTIVE: Abdominal discomfort much better.  OBJECTIVE: BP 156/88  Pulse 56  Temp(Src) 98.7 F (37.1 C) (Oral)  Resp 16  SpO2 97% No intake or output data in the 24 hours ending 09/05/11 1731                    Weight change:  Physical Exam: General: Alert and awake oriented x3 not in any acute distress. HEENT: anicteric sclera, pupils equal reactive to light and accommodation CVS: S1-S2 heard, no murmur rubs or gallops Chest: clear to auscultation bilaterally, no wheezing rales or rhonchi Abdomen:  normal bowel sounds, soft, nontender, nondistended, no organomegaly Neuro: Cranial nerves II-XII intact, no focal neurological deficits Extremities: no cyanosis, no clubbing or edema noted bilaterally   Lab Results:  [facility] 09/03/11 0620  NA 137  K 4.6  CL 99  CO2 31  GLUCOSE 100*  BUN 25*  CREATININE 2.04*  CALCIUM 9.3  MG --  PHOS --    Basename 09/03/11 0620  AST 32  ALT 43  ALKPHOS 61  BILITOT 0.5  PROT 6.9  ALBUMIN 3.6    Basename 09/03/11 0620  WBC 6.2  NEUTROABS --  HGB 15.8  HCT 46.9  MCV 94.9  PLT 110*   Micro Results: No results found for this or any previous visit (from the past 240 hour(s)).  Studies/Results: Ct Abdomen Pelvis Wo Contrast  09/03/2011  *RADIOLOGY REPORT*  Clinical Data: Abdominal pain, constipation  CT ABDOMEN AND PELVIS WITHOUT CONTRAST  Technique:  Multidetector CT imaging of the abdomen and pelvis was performed following the standard protocol without intravenous contrast.  Comparison: None.  Findings: Lung bases are clear.  Non-IV contrast images demonstrate no focal hepatic lesion. Gallbladder is surgically absent.  The pancreas, spleen, adrenal glands, and left kidney are normal.  Small simple cyst extending from the left kidney.  No hydronephrosis.  Prior right nephrectomy.  No nodularity in nephrectomy bed.  The stomach, small  bowel and cecum are normal.  The cecum extends anterior and is midline.  Appendix is normal.  There is a moderate volume stool in the ascending colon.  Ascending colon is tortuous. Transverse colon is gas filled.  The descending colon is fluid- filled.  There is stool in the rectum.  Abdominal aorta normal caliber.  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  No free fluid the pelvis.  The prostate gland and bladder normal. No pelvic lymphadenopathy.  Review of  bone windows demonstrates no aggressive osseous lesions.  IMPRESSION:  1.  Moderate volume stool in tortuous ascending colon suggest constipation.  No obstructing lesions identified.  The cecum has a midline anterior position. 2.  No evidence of  bowel obstruction. 3.  No additional acute findings on this noncontrast exam.  Original Report Authenticated By: Genevive Bi, M.D.   Dg Abd Acute W/chest  08/31/2011  OVERREAD BY [facility] *RADIOLOGY REPORT*  Clinical Data: Periumbilical pain  ACUTE ABDOMEN SERIES (ABDOMEN 2 VIEW & CHEST 1 VIEW)  Comparison: None.  Findings: Normal cardiac silhouette.  Lungs are clear.  No free air beneath hemidiaphragms.  No dilated loops of large or small bowel.  Small amount gas the rectum.  No pathologic calcifications.  Multiple surgical clips the right abdomen.  IMPRESSION:  1.  Clear chest. 2.  No intraperitoneal free air. 3.  No bowel obstruction.  Original Report Authenticated By: Genevive Bi, M.D.   Medications: Scheduled Meds:    . amLODipine  5 mg Oral Daily  . feeding supplement  237 mL Oral TID WC  . lactulose  20 g Oral TID  . lithium carbonate  300 mg Oral QHS  . mirtazapine  30 mg Oral QHS  . multivitamin animal shapes (with Ca/FA)  1 tablet Oral Daily  . OLANZapine zydis  5 mg Oral QHS  . polyethylene glycol  17 g Oral Daily  . propranolol  20 mg Oral BID  . senna-docusate  2 tablet Oral BID  . sertraline  100 mg Oral Daily  . sodium phosphate  1 enema Rectal Once  . Valproic Acid   250 mg Oral BID   And  . Valproic Acid  500 mg Oral QHS  . DISCONTD: sertraline  50 mg Oral Daily   Continuous Infusions:  PRN Meds:.alum & mag hydroxide-simeth, magnesium hydroxide, ondansetron, sorbitol  ASSESSMENT & PLAN: Principal Problem:  *Bipolar I disorder, most recent episode (or current) depressed Active Problems:  Constipation  Abdominal pain -CT scan of abdomen was negative for any abnormalities, except for the constipation. -This is likely secondary to constipation is much better after several bowel movements.  Constipation -Constipation resolved, patient had several bowel movements, abdominal discomfort is much better. -I'll keep him on daily MiraLax, hold if has diarrhea.  CKD stage III -At baseline  Bipolar disorder -Management per primary psychiatry team.  I will sign off please call if you have any question.   LOS: 4 days   [name] A 09/05/2011, 5:31 PM

## 2011-09-05 NOTE — Progress Notes (Signed)
Patient ID: [name], male   DOB: 1951-02-06, 61 y.o.   MRN: [id number] Pt. Lying in bed, does not attend group. Pt. Reports depression. Pt. Reports no BM, writer access bowel sounds.( See daily cares) Staff will continue to monitor q68min for safety.

## 2011-09-05 NOTE — Tx Team (Signed)
Interdisciplinary Treatment Plan Update (Adult)  Date:  09/05/2011  Time Reviewed:  10:15 AM   Progress in Treatment: Attending groups: Yes Participating in groups:  Yes Taking medication as prescribed: Yes Tolerating medication:  Yes Family/Significant other contact made: Yes Patient understands diagnosis:  Yes Discussing patient identified problems/goals with staff:  Yes Medical problems stabilized or resolved:  Yes Denies suicidal/homicidal ideation: Yes Issues/concerns per patient self-inventory:  None identified Other: N/A  New problem(s) identified: None Identified  Reason for Continuation of Hospitalization: Anxiety Depression Medication stabilization  Interventions implemented related to continuation of hospitalization: mood stabilization, medication monitoring and adjustment, group therapy and psycho education, safety checks q 15 mins  Additional comments: N/A  Estimated length of stay: 3-5 days  Discharge Plan: Pt will follow up at [facility] for med management.  Team feels he would benefit on medical floor  New goal(s): N/A  Review of initial/current patient goals per problem list:    1.  Goal(s): Reduce depressive symptoms  Met:  No  Target date: by discharge  As evidenced by: Reducing depression from a 10 to a 3 as reported by pt.   2.  Goal (s): Reduce/Eliminate suicidal ideation  Met:  No  Target date: by discharge  As evidenced by: pt reporting no SI.    3.  Goal(s): Reduce anxiety symptoms  Met:  No  Target date: by discharge  As evidenced by: Reduce anxiety from a 10 to a 3 as reported by pt.    Attendees: Patient:     Family:     Physician:  Orson Aloe, MD  09/05/2011  10:15 AM   Nursing:   Omelia Blackwater, RN 09/05/2011 10:16 AM   Case Manager:  Reyes Ivan, LCSWA 09/05/2011  10:15 AM   Counselor:  Angus Palms, LCSW 09/05/2011  10:15 AM   Other:  Quintella Reichert, RN 09/05/2011  10:15 AM   Other:   09/05/2011  10:15 AM     Other:     Other:      Scribe for Treatment Team:   Carmina Miller, 09/05/2011 , 10:15 AM

## 2011-09-05 NOTE — Telephone Encounter (Signed)
I called [name] and let her know I spoke with the team leader on the psychiatric floor. I also talked with Dr. Donell Beers. I suggested they consider reinstitution of lithium therapy for his bipolar disease. I told them to also consider transfer to [facility] where his parents live.

## 2011-09-05 NOTE — Progress Notes (Signed)
Patient ID: KAHNE HELFAND, male   DOB: 1950/12/02, 61 y.o.   MRN: [id number]  Received a phone call from the patient's outpatient Psychiatrist Dr. Archer Asa who provided collateral information.  He states that the patient did well on low dose Lithium Carbonate at 300 mgs po qhs for some time but then experienced a hypomanic episode.  After some discussion, it was decided to restart the Lithium Carbonate and to very closely monitor the patient BUN and Creatinine.  A Neurology Consult has been ordered to assess the patient's tremor and Internal Medicine is following the patient for his G.I and non-psychiatric medical issues.  In addition, PT has been consulted with the patient refusing to participate.  A Nutrition Consult has also been ordered.   D. Plovsky also stated that he has been receiving calls from the family and he reports to being supportive of the care the patient is receiving.

## 2011-09-05 NOTE — Progress Notes (Signed)
Pt did not attend d/c planning group on this date.  SW met with pt individually at this time.  Pt was sitting up in the bed during this interaction.  Pt continues to have a tremor.  Pt states that he is doing "a hair better" today.  Pt ranks depression and anxiety at a 5 today.  No further needs voiced by pt at this time.   [name], LCSWA 09/05/2011  12:08 PM

## 2011-09-05 NOTE — Progress Notes (Signed)
This Clinical research associate spoke with pt and pts family at 1230 about pts care and lack of progress. Pts wife expressed that she was concerned about pts physical health regressing in the past couple of days. This Clinical research associate talked with Thurman Coyer, the [facility] about her concern and he said he would pass this on to [employer]. This Clinical research associate also talked with Everlene Balls in person, expressing concern for this pts care. The pt has been unable to attend groups or go to the cafeteria for meals. This Clinical research associate expressed concern to pts RN and this Counsellor know I had spoken with Thurman Coyer, [facility] and Everlene Balls, AD.

## 2011-09-05 NOTE — Progress Notes (Addendum)
Patient ID: [name], male   DOB: Feb 20, 1951, 61 y.o.   MRN: [id number]   Talked with Adarryl today regarding Dr. Ramond Craver recommendation of a neurology consult. Orvill reports that he first noticed that he had a tremor in his hands and arms at the age of 11, which is primary care physician attributed to the lithium that he was taking at that time. He reports his tremor is getting worse. Pulmonology consult note noted in October of 2012 they had significant coarse tremor had spilled a drink.  He reports he has never seen a neurologist for this tremor or had any neurology evaluation..  In the past he has taken lithium for most of his life to control his bipolar disorder, he has also in the past taken Depakote, and Paxil. Within the past 3 months he was started on Abilify 5 mg which was increased at 15 mg. Zyprexa was started here on this unit.  Plan: Will obtain neurology consult for tremor and bradykinesia.  Have spoken with Onalee Hua PA for neuro @ [phone] and patient will be seen at some point in next 24 hrs, probably later tonight or tomorrow am.

## 2011-09-05 NOTE — Progress Notes (Signed)
Pt continues to lay in bed most of the day. Pt was finally able to have a BM after many efforts the enema worked for him. Pt still has discomfort but does have another scheduled enema tonight. Pt was offered support and encouragement. Pt safety maintained on unit.

## 2011-09-05 NOTE — Progress Notes (Signed)
Patient ID: [name], male   DOB: 06-14-1951, 61 y.o.   MRN: [id number] Pt. Wife called to enquire about pt. BM and what med were ordered. Writer told wife what meds were available.

## 2011-09-06 ENCOUNTER — Inpatient Hospital Stay (HOSPITAL_COMMUNITY): Payer: 59

## 2011-09-06 ENCOUNTER — Encounter (HOSPITAL_COMMUNITY): Payer: Self-pay | Admitting: Emergency Medicine

## 2011-09-06 ENCOUNTER — Other Ambulatory Visit: Payer: Self-pay

## 2011-09-06 DIAGNOSIS — R4182 Altered mental status, unspecified: Secondary | ICD-10-CM | POA: Diagnosis present

## 2011-09-06 LAB — BLOOD GAS, ARTERIAL
Bicarbonate: 28.6 mEq/L — ABNORMAL HIGH (ref 20.0–24.0)
pCO2 arterial: 45 mmHg (ref 35.0–45.0)
pH, Arterial: 7.42 (ref 7.350–7.450)
pO2, Arterial: 104 mmHg — ABNORMAL HIGH (ref 80.0–100.0)

## 2011-09-06 LAB — COMPREHENSIVE METABOLIC PANEL
Albumin: 3.7 g/dL (ref 3.5–5.2)
Alkaline Phosphatase: 67 U/L (ref 39–117)
BUN: 28 mg/dL — ABNORMAL HIGH (ref 6–23)
Chloride: 97 mEq/L (ref 96–112)
GFR calc Af Amer: 40 mL/min — ABNORMAL LOW (ref >90)
Glucose, Bld: 103 mg/dL — ABNORMAL HIGH (ref 70–99)
Potassium: 4.7 mEq/L (ref 3.5–5.1)
Total Bilirubin: 0.6 mg/dL (ref 0.3–1.2)

## 2011-09-06 LAB — CBC
MCV: 93.3 fL (ref 78.0–100.0)
Platelets: 138 10*3/uL — ABNORMAL LOW (ref 150–400)
RDW: 13.3 % (ref 11.5–15.5)
WBC: 7.8 10*3/uL (ref 4.0–10.5)

## 2011-09-06 LAB — VALPROIC ACID LEVEL: Valproic Acid Lvl: 52.9 ug/mL (ref 50.0–100.0)

## 2011-09-06 LAB — FOLATE RBC: RBC Folate: 1925 ng/mL — ABNORMAL HIGH (ref >=366)

## 2011-09-06 LAB — URINALYSIS, ROUTINE W REFLEX MICROSCOPIC
Ketones, ur: NEGATIVE mg/dL
Leukocytes, UA: NEGATIVE
Nitrite: NEGATIVE
Protein, ur: NEGATIVE mg/dL
Urobilinogen, UA: 1 mg/dL (ref 0.0–1.0)
pH: 7 (ref 5.0–8.0)

## 2011-09-06 LAB — POCT I-STAT TROPONIN I

## 2011-09-06 LAB — DIFFERENTIAL
Basophils Absolute: 0 10*3/uL (ref 0.0–0.1)
Eosinophils Relative: 1 % (ref 0–5)
Lymphocytes Relative: 11 % — ABNORMAL LOW (ref 12–46)

## 2011-09-06 LAB — ACETAMINOPHEN LEVEL: Acetaminophen (Tylenol), Serum: <15 ug/mL (ref 10–30)

## 2011-09-06 LAB — VITAMIN B12: Vitamin B-12: 544 pg/mL (ref 211–911)

## 2011-09-06 MED ORDER — SODIUM CHLORIDE 0.9 % IV BOLUS (SEPSIS)
1000.0000 mL | Freq: Once | INTRAVENOUS | Status: AC
  Administered 2011-09-06: 1000 mL via INTRAVENOUS

## 2011-09-06 MED ORDER — BISACODYL 10 MG RE SUPP: 10.0000 mg | Freq: Every day | RECTAL | Status: DC | PRN

## 2011-09-06 MED ORDER — ACETAMINOPHEN 650 MG RE SUPP: 650.0000 mg | Freq: Four times a day (QID) | RECTAL | Status: DC | PRN

## 2011-09-06 MED ORDER — ACETAMINOPHEN 325 MG PO TABS: 650.0000 mg | ORAL_TABLET | Freq: Four times a day (QID) | ORAL | Status: DC | PRN

## 2011-09-06 MED ORDER — HEPARIN SODIUM (PORCINE) 5000 UNIT/ML IJ SOLN
5000.0000 [IU] | Freq: Three times a day (TID) | INTRAMUSCULAR | Status: DC
  Filled 2011-09-06 (×2): qty 1

## 2011-09-06 MED ORDER — SODIUM CHLORIDE 0.9 % IJ SOLN: 3.0000 mL | Freq: Two times a day (BID) | INTRAMUSCULAR | Status: DC

## 2011-09-06 MED ORDER — SODIUM CHLORIDE 0.9 % IV SOLN: INTRAVENOUS | Status: DC

## 2011-09-06 MED ORDER — FLEET ENEMA 7-19 GM/118ML RE ENEM: 1.0000 | ENEMA | Freq: Once | RECTAL | Status: DC | PRN

## 2011-09-06 MED ORDER — ONDANSETRON HCL 4 MG PO TABS: 4.0000 mg | ORAL_TABLET | Freq: Four times a day (QID) | ORAL | Status: DC | PRN

## 2011-09-06 MED ORDER — SENNA 8.6 MG PO TABS: 1.0000 | ORAL_TABLET | Freq: Two times a day (BID) | ORAL | Status: DC

## 2011-09-06 MED ORDER — ONDANSETRON HCL 4 MG/2ML IJ SOLN: 4.0000 mg | Freq: Four times a day (QID) | INTRAMUSCULAR | Status: DC | PRN

## 2011-09-06 MED ORDER — DOCUSATE SODIUM 100 MG PO CAPS: 100.0000 mg | ORAL_CAPSULE | Freq: Two times a day (BID) | ORAL | Status: DC

## 2011-09-06 NOTE — Progress Notes (Signed)
Patient ID: [name], male   DOB: 05/16/1951, 60 y.o.   MRN: [id number] Pt. In bed affect brighter, reports parents, and wife visited this evening. Pt. Reports depression as "6" out of 10. Pt. Denies SHI. Tremors noted, WC at bedside.  Staff will monitor q9min for safety. Pt. Remains safe on the unit.

## 2011-09-06 NOTE — ED Notes (Signed)
Patient is resting comfortably, asleep. Sitter with patient.Spouse stepped out but left her contact information top be reached.

## 2011-09-06 NOTE — ED Notes (Signed)
AVW:[id number]> Expected date:<BR> Expected time: 4:33 PM<BR> Means of arrival:<BR> Comments:<BR> M10 - 48yoM [facility] transfer, catatonic after lithium

## 2011-09-06 NOTE — ED Notes (Signed)
Change of sitters. Pt movement delayed to go to the floor since waiting for the xray

## 2011-09-06 NOTE — Progress Notes (Signed)
[facility] Group Notes:  (Counselor/Nursing/MHT/Case Management/Adjunct)  09/06/2011 11:56 AM  Type of Therapy:  Group Therapy  Participation Level:  Did Not Attend   [name] 09/06/2011, 11:56 AM

## 2011-09-06 NOTE — Progress Notes (Signed)
[facility] MD Progress Note  09/06/2011 3:42 PM  Diagnosis:  Axis I: Bipolar I Disorder - Most Recent Episode - Depressed.   The patient was seen today and the following was observed or reported by staff:   ADL's: Intact.  Sleep: The patient reportedly sleep well last night.  Appetite: The patient displays a decreased appetite but ate well at lunch.   Mild>(1-10) >Severe  Hopelessness (1-10):  Could not respond.  Depression (1-10):  Could not respond.  Anxiety (1-10):  Could not respond.   Suicidal Ideation: The patient has not displayed any suicidal behavior.  Plan: No  Intent: No  Means: No   Homicidal Ideation: The patient has not displayed any homicidal behaviors.  Plan: No  Intent: No.  Means: No   General Appearance/Behavior: Patient found laying in bed and somewhat catatonic and unable to speak.  Patient does have an ongoing moderate bilateral resting tremor and cogwheel rigidity.  Eye Contact: Fair.  Speech: . The patient is unable to speak at time of assessment.   Motor Behavior: Severely slowed.  Level of Consciousness: Alert and Oriented x 3.  Mental Status: Alert and Oriented x 3.  Mood: Severely Depressed.  Affect: Essentially Flat.  Anxiety Level: Severe anxiety noted.  Thought Process: wnl.  Thought Content: The patient did not display any evidence of psychotic symptoms. Perception:. wnl.  Judgment: Fair to Good.  Insight: Fair to Good.  Cognition: Orientated to person, place and time.  Sleep:  Number of Hours: 5.25    Vital Signs:Blood pressure 154/87, pulse 69, temperature 98.7 F (37.1 C), temperature source Oral, resp. rate 16, SpO2 97.00%.  Current Medications: Current Facility-Administered Medications  Medication Dose Route Frequency Provider Last Rate Last Dose  . alum & mag hydroxide-simeth (MAALOX/MYLANTA) 200-200-20 MG/5ML suspension 30 mL  30 mL Oral Q4H PRN Wonda Cerise, MD   30 mL at 09/03/11 1904  . amLODipine (NORVASC) tablet 5 mg  5 mg Oral  Daily Curlene Labrum Ndrew Creason, MD   5 mg at 09/06/11 0841  . feeding supplement (ENSURE IMMUNE HEALTH) liquid 237 mL  237 mL Oral TID WC Curlene Labrum Argelio Granier, MD   237 mL at 09/06/11 1218  . lithium carbonate (LITHOBID) CR tablet 300 mg  300 mg Oral QHS Curlene Labrum Margerite Impastato, MD   300 mg at 09/05/11 2215  . magnesium hydroxide (MILK OF MAGNESIA) suspension 30 mL  30 mL Oral Daily PRN Wonda Cerise, MD      . mirtazapine (REMERON SOL-TAB) disintegrating tablet 30 mg  30 mg Oral QHS Curlene Labrum Tyray Proch, MD   30 mg at 09/05/11 2215  . multivitamin animal shapes (with Ca/FA) chewable tablet 1 tablet  1 tablet Oral Daily Ronny Bacon, MD   1 tablet at 09/06/11 0841  . OLANZapine zydis (ZYPREXA) disintegrating tablet 5 mg  5 mg Oral QHS Curlene Labrum Kahliya Fraleigh, MD   5 mg at 09/05/11 2215  . ondansetron (ZOFRAN-ODT) disintegrating tablet 4 mg  4 mg Oral Q4H PRN Viviann Spare, NP   4 mg at 09/03/11 1200  . polyethylene glycol (MIRALAX / GLYCOLAX) packet 17 g  17 g Oral Daily Curlene Labrum Nimrod Wendt, MD   17 g at 09/05/11 0856  . propranolol (INDERAL) tablet 20 mg  20 mg Oral BID Curlene Labrum Wille Aubuchon, MD   20 mg at 09/06/11 0841  . sertraline (ZOLOFT) 20 MG/ML concentrated solution 100 mg  100 mg Oral Daily Curlene Labrum Arleigh Dicola, MD   100 mg at 09/06/11 0924  .  sodium phosphate (FLEET) 7-19 GM/118ML enema 1 enema  1 enema Rectal Once Ronny Bacon, MD   1 enema at 09/05/11 1543  . sodium phosphate (FLEET) 7-19 GM/118ML enema 1 enema  1 enema Rectal Once Ronny Bacon, MD   1 enema at 09/05/11 2217  . sorbitol 70 % solution 30 mL  30 mL Oral Daily PRN Marinda Elk, MD   30 mL at 09/04/11 2326  . Valproic Acid (DEPAKENE) 250 MG/5ML syrup SYRP 250 mg  250 mg Oral BID Curlene Labrum Felicitas Sine, MD   250 mg at 09/06/11 0841   And  . Valproic Acid (DEPAKENE) 250 MG/5ML syrup SYRP 500 mg  500 mg Oral QHS Curlene Labrum Xzavion Doswell, MD   500 mg at 09/05/11 2216  . DISCONTD: senna-docusate (Senokot-S) tablet 2 tablet  2 tablet Oral BID Ronny Bacon, MD   2  tablet at 09/05/11 1650   Lab Results:  Results for orders placed during the hospital encounter of 09/01/11 (from the past 48 hour(s))  VALPROIC ACID LEVEL     Status: Normal   Collection Time   09/05/11  7:47 PM      Component Value Range Comment   Valproic Acid Lvl 52.9  50.0 - 100.0 (ug/mL)   VITAMIN B12     Status: Normal   Collection Time   09/05/11  7:47 PM      Component Value Range Comment   Vitamin B-12 544  211 - 911 (pg/mL)   FOLATE RBC     Status: Abnormal   Collection Time   09/05/11  7:47 PM      Component Value Range Comment   RBC Folate 1925 (*) >=366 (ng/mL) Reference range not established for pediatric patients.  RPR     Status: Normal   Collection Time   09/05/11  7:47 PM      Component Value Range Comment   RPR NON REACTIVE  NON REACTIVE     Time was spent today discussing with the patient his current symptoms. The patient was seen earlier walking in the hall with Physical Therapy and observed interacting with his family who came for lunch.  At approximately 3:15 pm, this Physician entered the patient's room to further evaluate his condition and the patient was observed in bed with significant difficulty speaking.  He also displayed moderate cogwheel rigidity in his muscles and had a slowed blind response and difficulty with eye movement.  The patient could acknowledge by nodding his head and could follow simple commands such as blinking.  Considering the rapid change in the patient's condition it was decided to transfer the patient to the ED for evaluate of any acute event which may explain his change.   Must Rule Out:   1. Acute neurologic event such as CVA or metastasis of past CA.  2. Medication reaction such as acute dystonic reaction - unlikely since no recent changes made to neuroleptic medication.  3. Exacerbation of undiagnosed Neurologic Illness such as Parkinson's Disease.  4. Lithium toxicity - unlikely since patient has received only one dosage of 300  mgs but not out of the question due to his renal compromise.  Treatment Plan Summary:  1. Daily contact with patient to assess and evaluate symptoms and progress in treatment  2. Medication management  3. The patient will deny suicidal ideations or homicidal ideations for 48 hours prior to discharge and have a depression and anxiety rating of 3 or less. The patient will also deny  any auditory or visual hallucinations or delusional thinking.   Plan:  1. Will continue current medications.  2. Will continue to monitor.  3. Laboratory Studies reviewed.  4. Have reviewed both Internal Medicine and Neurology Consults with both specialties now signing off on the patient's care. 5. Have reviewed PT Consult.  6. Considering the patient's sudden change in Mental Status, will transfer the patient to the ED for work up of any acute medical processes.  Would strongly recommend head imaging to rule out CNS event.  Kalani Sthilaire 09/06/2011, 3:42 PM

## 2011-09-06 NOTE — Progress Notes (Signed)
Pt attended discharge planning group and actively participated.  SW notes this was the first time pt was able to make it to d/c planning group.  Pt reports having improved mood today.  Pt states that the dr started him on a low dose of lithium, which pt believes will help him.  Pt states that his wife and parents visited him yesterday.  Pt ranks depression and anxiety at a 7 today.  Pt denies SI.  Pt will follow up at [facility] with Dr. Donell Beers for medication management.  No further needs voiced by pt at this time.    [name], LCSWA 09/06/2011  9:44 AM

## 2011-09-06 NOTE — Progress Notes (Signed)
Attempted report to [facility] [place]. RN unable to take report at this time. Call back number given.

## 2011-09-06 NOTE — H&P (Signed)
PCP:  Lucilla Edin, MD, MD  Confirmed with wife. We admit for [place] at [facility] Complaint:  Altered mental status  HPI: 61yoM with h/o renal cell ca s/p right nephrectomy, bipolar disorder, admitted to  [facility] for the better part of March now admitted with acute mental status  changes, decreased responsiveness.   History gathered from wife and ED staff, and review of EPIC. Per wife, pt has been off  lithium starting about 6 months ago, and a couple months ago was acting more manic, then  started to level off, however for the past month has been more down, depressed for which  he was initially admitted to [facility] from 3/7 to 3/14. Follow up note from PCP Dr. Cleta Alberts  after discharge indicates that he was getting more catatonic. He was ultimately admitted  back to [facility] where there have been various medication changes to his Depakote, started  zoloft, ? started on zyprexa. He received one dose of lithium last night, and this  morning was in his usual state of health. Wife visited him around lunchtime when he was  very quiet and down, but still able to talk, was ambulatory. She left but was called back  later bc apparently he acutely got unresponsive, was slurring speech, not talking, or  speaking nonsensically. Dr. Allena Katz at [facility] notes that he could not speak, but could  respond by nodding his head. For this he was sent to the ED.   In the ED, vitals were stable. Labs showed stable chem panel including renal 28/2.0,  normal LFT's. Trop POC negative. ABG normal. Normal CBC. Lithium level < 0.25, negative  tylenol and salicylates. UA negative. CXR with likely left basilar atelectasis. CT head  with atrophy but nothing acute.   ROS as above, otherwise constipation has been an issue over the past month for which he  has been sent to the ED while at [facility]. Otherwise, ROS mostly significant for psych issues  and otherwise unremarkable. Wife denies any history of seizures or  strokes.     Past Medical History  Diagnosis Date  . Renal cell carcinoma     chemo and xrt--lost rt kidney  . Childhood asthma   . Sinusitis   . Cellulitis     after left foot surgery  . Osteoarthritis   . OSA (obstructive sleep apnea)     NPSG 05-08-07 AHI 63.1/hr,desat t0 80%/loud snoring  . Bipolar 1 disorder   . Hyperlipemia   . Mental disorder     Past Surgical History  Procedure Date  . Podiatric surgery     left foot toes,   . Bunionectomy   . Nephrectomy 1998    rt., d/t cancer  . Cholecystectomy   . Colon surgery     Medications:  HOME MEDS:   Per pharmacy tech having called [facility] Prior to Admission medications   Medication Sig Start Date End Date Taking? Authorizing Provider  amLODipine (NORVASC) 5 MG tablet Take 5 mg by mouth daily.   Yes Historical Provider, MD  feeding supplement (ENSURE IMMUNE HEALTH) LIQD Take 237 mLs by mouth 3 (three) times daily with meals.   Yes Historical Provider, MD  lithium carbonate (LITHOBID) 300 MG CR tablet Take 300 mg by mouth every evening.   Yes Historical Provider, MD  mirtazapine (REMERON) 30 MG tablet Take by mouth ONE FOURTH tablet at bed time For insomnia. 08/29/11  Yes Mike Craze, MD  Multiple Vitamin (MULTIVITAMIN) tablet Take  1 tablet by mouth daily. For nutritional supplementation. 08/29/11  Yes Mike Craze, MD  OLANZapine zydis (ZYPREXA) 5 MG disintegrating tablet Take 5 mg by mouth at bedtime.   Yes Historical Provider, MD  ondansetron (ZOFRAN-ODT) 4 MG disintegrating tablet Take 4 mg by mouth every 8 (eight) hours as needed. Nausea   Yes Historical Provider, MD  polyethylene glycol (MIRALAX / GLYCOLAX) packet Take 17 g by mouth daily.   Yes Historical Provider, MD  propranolol (INDERAL) 20 MG tablet Take 20 mg by mouth 2 (two) times daily.   Yes Historical Provider, MD  sertraline (ZOLOFT) 20 MG/ML concentrated solution Take 100 mg by mouth daily.   Yes Historical Provider, MD  Valproic Acid (DEPAKENE) 250  MG/5ML SYRP syrup Take 250 mg by mouth 2 (two) times daily.   Yes Historical Provider, MD    Allergies:  No Known Allergies  Social History:   reports that he has never smoked. He has never used smokeless tobacco. He reports that he drinks alcohol. He reports that he does not use illicit drugs. Wife Bonita Quin  Family History: Family History  Problem Relation Age of Onset  . Hypertension    . Sleep apnea    . Emphysema    . Prostate cancer      Physical Exam: Filed Vitals:   09/06/11 1652 09/06/11 1700 09/06/11 1702 09/06/11 2013  BP:  171/105  153/93  Pulse:  64  61  Temp:      TempSrc:      Resp:  19  18  SpO2: 100% 100% 100% 100%   Blood pressure 153/93, pulse 61, temperature 98.7 F (37.1 C), temperature source Oral, resp. rate 18, SpO2 100.00%.  Gen: Middle aged, still robust appearing M in no distress, but is not very responsive.  His eyes are open minimally and he occasionally looks around, but is almost catatonic in  appearance. He occasionally answers a question or two appropriately with yes/no, and  whispers his answers. He has notable eyelid twitching that is persistent and per wife,  new. He's in no distress, breathing comfortably. Follows a few commands minimally.  HEENT: Eyes are minimally open and he resists my forcibly opening them. Pupils are round  and reactive ~3-41mm, sclera clear. Mouth is very dry appearing, and he does open it very  minimally to my command.  Lungs: CTAB grossly anterolaterally. Air movement fair, no gross adventitious lung sounds Heart: regular, not tachycardic, no m/g  Abd: Distended but not rigid or peritoneal, no facial grimacing to palpation, hypoactive  BS's Extrem: Warm, perfusing normally, good bulk, normal tone, although he has a gross  bilateral hand tremor. Radials palpable, no BLE edema although prominent hyperpigmented  changes noted.  Neuro: Not attentive, but minimally alert and minimally responsive, answers a few    questions and follows a few commands. Prominent eyelid twitching and bilateral hand  tremors noted. Some minimal rigidity of bilateral arms, but no cogwheeling. ? facial  asymmetry noted, some loss of right nasolabial fold. Otherwise, not able to examine  fully.    Labs & Imaging Results for orders placed during the hospital encounter of 09/01/11 (from the past 48 hour(s))  VALPROIC ACID LEVEL     Status: Normal   Collection Time   09/05/11  7:47 PM      Component Value Range Comment   Valproic Acid Lvl 52.9  50.0 - 100.0 (ug/mL)   VITAMIN B12     Status: Normal   Collection Time  09/05/11  7:47 PM      Component Value Range Comment   Vitamin B-12 544  211 - 911 (pg/mL)   FOLATE RBC     Status: Abnormal   Collection Time   09/05/11  7:47 PM      Component Value Range Comment   RBC Folate 1925 (*) >=366 (ng/mL) Reference range not established for pediatric patients.  RPR     Status: Normal   Collection Time   09/05/11  7:47 PM      Component Value Range Comment   RPR NON REACTIVE  NON REACTIVE    URINALYSIS, ROUTINE W REFLEX MICROSCOPIC     Status: Normal   Collection Time   09/06/11  5:41 PM      Component Value Range Comment   Color, Urine YELLOW  YELLOW     APPearance CLEAR  CLEAR     Specific Gravity, Urine 1.016  1.005 - 1.030     pH 7.0  5.0 - 8.0     Glucose, UA NEGATIVE  NEGATIVE (mg/dL)    Hgb urine dipstick NEGATIVE  NEGATIVE     Bilirubin Urine NEGATIVE  NEGATIVE     Ketones, ur NEGATIVE  NEGATIVE (mg/dL)    Protein, ur NEGATIVE  NEGATIVE (mg/dL)    Urobilinogen, UA 1.0  0.0 - 1.0 (mg/dL)    Nitrite NEGATIVE  NEGATIVE     Leukocytes, UA NEGATIVE  NEGATIVE  MICROSCOPIC NOT DONE ON URINES WITH NEGATIVE PROTEIN, BLOOD, LEUKOCYTES, NITRITE, OR GLUCOSE <1000 mg/dL.  BLOOD GAS, ARTERIAL     Status: Abnormal   Collection Time   09/06/11  5:42 PM      Component Value Range Comment   FIO2 0.28      Delivery systems NASAL CANNULA      pH, Arterial 7.420  7.350 - 7.450      pCO2 arterial 45.0  35.0 - 45.0 (mmHg)    pO2, Arterial 104.0 (*) 80.0 - 100.0 (mmHg)    Bicarbonate 28.6 (*) 20.0 - 24.0 (mEq/L)    TCO2 24.0  0 - 100 (mmol/L)    Acid-Base Excess 3.8 (*) 0.0 - 2.0 (mmol/L)    O2 Saturation 97.7      Patient temperature 98.6      Collection site RIGHT BRACHIAL      Drawn by [id number]      Sample type ARTERIAL DRAW      Allens test (pass/fail) PASS  PASS    COMPREHENSIVE METABOLIC PANEL     Status: Abnormal   Collection Time   09/06/11  5:50 PM      Component Value Range Comment   Sodium 135  135 - 145 (mEq/L)    Potassium 4.7  3.5 - 5.1 (mEq/L)    Chloride 97  96 - 112 (mEq/L)    CO2 29  19 - 32 (mEq/L)    Glucose, Bld 103 (*) 70 - 99 (mg/dL)    BUN 28 (*) 6 - 23 (mg/dL)    Creatinine, Ser 0.45 (*) 0.50 - 1.35 (mg/dL)    Calcium 9.6  8.4 - 10.5 (mg/dL)    Total Protein 7.4  6.0 - 8.3 (g/dL)    Albumin 3.7  3.5 - 5.2 (g/dL)    AST 33  0 - 37 (U/L)    ALT 36  0 - 53 (U/L)    Alkaline Phosphatase 67  39 - 117 (U/L)    Total Bilirubin 0.6  0.3 - 1.2 (mg/dL)  GFR calc non Af Amer 35 (*) >90 (mL/min)    GFR calc Af Amer 40 (*) >90 (mL/min)   CBC     Status: Abnormal   Collection Time   09/06/11  5:50 PM      Component Value Range Comment   WBC 7.8  4.0 - 10.5 (K/uL)    RBC 5.26  4.22 - 5.81 (MIL/uL)    Hemoglobin 17.1 (*) 13.0 - 17.0 (g/dL)    HCT 16.1  09.6 - 04.5 (%)    MCV 93.3  78.0 - 100.0 (fL)    MCH 32.5  26.0 - 34.0 (pg)    MCHC 34.8  30.0 - 36.0 (g/dL)    RDW 40.9  81.1 - 91.4 (%)    Platelets 138 (*) 150 - 400 (K/uL)   DIFFERENTIAL     Status: Abnormal   Collection Time   09/06/11  5:50 PM      Component Value Range Comment   Neutrophils Relative 76  43 - 77 (%)    Neutro Abs 5.9  1.7 - 7.7 (K/uL)    Lymphocytes Relative 11 (*) 12 - 46 (%)    Lymphs Abs 0.9  0.7 - 4.0 (K/uL)    Monocytes Relative 11  3 - 12 (%)    Monocytes Absolute 0.9  0.1 - 1.0 (K/uL)    Eosinophils Relative 1  0 - 5 (%)    Eosinophils Absolute 0.1  0.0 -  0.7 (K/uL)    Basophils Relative 1  0 - 1 (%)    Basophils Absolute 0.0  0.0 - 0.1 (K/uL)   AMMONIA     Status: Normal   Collection Time   09/06/11  5:50 PM      Component Value Range Comment   Ammonia 49  11 - 60 (umol/L)   LITHIUM LEVEL     Status: Abnormal   Collection Time   09/06/11  5:50 PM      Component Value Range Comment   Lithium Lvl <0.25 (*) 0.80 - 1.40 (mEq/L)   ACETAMINOPHEN LEVEL     Status: Normal   Collection Time   09/06/11  5:50 PM      Component Value Range Comment   Acetaminophen (Tylenol), Serum <15.0  10 - 30 (ug/mL)   SALICYLATE LEVEL     Status: Abnormal   Collection Time   09/06/11  5:50 PM      Component Value Range Comment   Salicylate Lvl <2.0 (*) 2.8 - 20.0 (mg/dL)   POCT I-STAT TROPONIN I     Status: Normal   Collection Time   09/06/11  6:06 PM      Component Value Range Comment   Troponin i, poc 0.00  0.00 - 0.08 (ng/mL)    Comment 3             Dg Chest 2 View  09/06/2011  *RADIOLOGY REPORT*  Clinical Data: Altered mental status.  CHEST - 2 VIEW  Comparison: Single view of the chest 04/14/2008.  Findings: Lung volumes are lower on the comparison study with some streaky left basilar airspace opacity.  Right lung is clear.  Heart size is normal. No pneumothorax or effusion.  IMPRESSION: Streaky left basilar airspace opacity is likely due to atelectasis in this low-volume chest.  Original Report Authenticated By: Bernadene Bell. Maricela Curet, M.D.   Ct Head Wo Contrast  09/06/2011  *RADIOLOGY REPORT*  Clinical Data: Altered mental status.  CT HEAD WITHOUT CONTRAST  Technique:  Contiguous axial  images were obtained from the base of the skull through the vertex without contrast.  Comparison: 08/07/2006  Findings: There is no evidence of intracranial hemorrhage, brain edema or other signs of acute infarction.  There is no evidence of intracranial mass lesion or mass effect.  No abnormal extra-axial fluid collections are identified.  Mild diffuse cerebral atrophy shows  no significant change.  No evidence of hydrocephalus.  No skull fracture or other bone abnormality identified.  IMPRESSION:  1.  No acute intracranial abnormality. 2.  Mild cerebral atrophy.  Original Report Authenticated By: Danae Orleans, M.D.    ECG: Poor wandering baseline. NSR 59 bpm, LAD. No pathological ST deviations. Normal TW.  No prior for comparison.   Impression Present on Admission:  .Bipolar I disorder, most recent episode (or current) depressed .Constipation .CKD (chronic kidney disease), stage III .Altered mental status  61yoM with h/o renal cell ca s/p right nephrectomy, bipolar disorder, admitted to  [facility] for the better part of March now admitted with acute mental status  changes, decreased responsiveness.   1. AMS: There does not seem to be any internal medicine etiology I can ascribe this to as  his labs, CXR, EKG, ABG, Trop, CT head, and exam are either normal or would not explain  this presentation.   Although I'm not sure what his diagnosis is, I think a reasonable DDx is medication  effect/side effect (various recent psychiatric and psychotropic medical changes; is  persistent eyelid twitching a extrapyramidal side effect?) vs psychiatric etiology (acute  on chronic catatonia?). Seizure activity in the setting of decreased Depakote dosing  considered, but I do not think as likely, and it's certainly not status epilepticus as he  is conscious and can answer questions. Stroke like activity is very low on my  differential.   - Admit telemetry, bed rest, keep NPO for now. Neuro checks. Maintenance IVF's while poor  PO intake.  - MRI, EEG, have spoken to neurology already who have agreed to see him.   2. Abdominal distention: Recent problems with constipation and currently distended,  although would not explain his presentation.  - Abd plain to r/o obstruction. Fleet's, dulcolax, senna/colace   3. Medication reconciliation: I have asked pharmacy  tech to call [facility] for a complete,  recent medication list of what he was taking at [facility]. Wife was unclear about several  things. States zoloft was not on the list and other meds may not have been correct  dosages. Once this is done, MD in the am will have to continue/stop accordingly.  Overnight will just hold all meds (esp. given question of medication effects as above)   SubQ heparin prophy Telemetry, [facility] team 5 Presumed full code  Other plans as per orders.   Sharona Rovner 09/06/2011, 11:32 PM   Addendum: Med list reconciled by pharmacy tech. Will still hold all meds for now

## 2011-09-06 NOTE — Evaluation (Signed)
Physical Therapy Evaluation Patient Details Name: [name] MRN: [id number] DOB: 1950/07/29 Today's Date: 09/06/2011  Problem List:  Patient Active Problem List  Diagnoses  . MALIGNANT NEOPLASM OF KIDNEY EXCEPT PELVIS  . HYPERLIPIDEMIA  . BIPOLAR DISORDER UNSPECIFIED  . OBSTRUCTIVE SLEEP APNEA  . ASTHMA, CHILDHOOD  . METATARSALGIA  . DYSPNEA ON EXERTION  . Bipolar 1 disorder, mixed  . CKD (chronic kidney disease), stage III  . Bipolar I disorder, most recent episode (or current) depressed  . Constipation    Past Medical History:  Past Medical History  Diagnosis Date  . Renal cell carcinoma     chemo and xrt--lost rt kidney  . Childhood asthma   . Sinusitis   . Cellulitis     after left foot surgery  . Osteoarthritis   . OSA (obstructive sleep apnea)     NPSG 05-08-07 AHI 63.1/hr,desat t0 80%/loud snoring  . Bipolar 1 disorder   . Hyperlipemia   . Mental disorder    Past Surgical History:  Past Surgical History  Procedure Date  . Podiatric surgery     left foot toes,   . Bunionectomy   . Nephrectomy 1998    rt., d/t cancer  . Cholecystectomy   . Colon surgery     PT Assessment/Plan/Recommendation PT Assessment Clinical Impression Statement: Pt admitted to [facility] for bipolar disorder and depression.  Pt would benefit from acute PT services in order to improve independence with ambulation and increase strength to prepare for d/c home with spouse.  Educated pt to have person assist with OOB activity and alerted RN to use RW with pt for safety.  Recommend HHPT and 24/7 supervision at home, if this is unavailable may need SNF. PT Recommendation/Assessment: Patient will need skilled PT in the acute care venue PT Problem List: Decreased strength;Decreased activity tolerance;Decreased mobility;Decreased safety awareness;Decreased knowledge of use of DME PT Therapy Diagnosis : Difficulty walking;Generalized weakness PT Plan PT Frequency: Min 2X/week PT  Treatment/Interventions: DME instruction;Gait training;Stair training;Functional mobility training;Therapeutic activities;Therapeutic exercise;Patient/family education PT Recommendation Follow Up Recommendations: Home health PT;Supervision/Assistance - 24 hour Equipment Recommended: Rolling walker with 5" wheels PT Goals  Acute Rehab PT Goals PT Goal Formulation: With patient Time For Goal Achievement: 7 days Pt will go Sit to Stand: with supervision PT Goal: Sit to Stand - Progress: Goal set today Pt will go Stand to Sit: with supervision PT Goal: Stand to Sit - Progress: Goal set today Pt will Ambulate: >150 feet;with supervision;with least restrictive assistive device PT Goal: Ambulate - Progress: Goal set today Pt will Go Up / Down Stairs: 3-5 stairs;with supervision;with least restrictive assistive device;with rail(s) PT Goal: Up/Down Stairs - Progress: Goal set today Pt will Perform Home Exercise Program: with supervision, verbal cues required/provided PT Goal: Perform Home Exercise Program - Progress: Goal set today  PT Evaluation Precautions/Restrictions  Precautions Precautions: Fall Prior Functioning  Home Living Lives With: Spouse Home Adaptive Equipment: None Additional Comments: Pt would not answer questions about home environment. Prior Function Level of Independence: Independent with basic ADLs;Independent with gait Comments: PLOF per RN Cognition Cognition Arousal/Alertness: Awake/alert Cognition - Other Comments: pysch hx, pt spoke very little during evaluation Sensation/Coordination Sensation Light Touch: Appears Intact Coordination Fine Motor Movements are Fluid and Coordinated: No (bilateral UE tremors) Extremity Assessment RLE Strength RLE Overall Strength Comments: grossly 4-/5 throughout LLE Strength LLE Overall Strength Comments: grossly 4-/5 throughout Mobility (including Balance) Bed Mobility Bed Mobility: Yes Supine to Sit: 6: Modified  independent (Device/Increase time)  Transfers Transfers: Yes Sit to Stand: 4: Min assist;With upper extremity assist;From bed Sit to Stand Details (indicate cue type and reason): min/guard for safety Stand to Sit: 4: Min assist;With upper extremity assist Stand to Sit Details: min/guard for safety Ambulation/Gait Ambulation/Gait: Yes Ambulation/Gait Assistance: 4: Min assist Ambulation/Gait Assistance Details (indicate cue type and reason): min/guard, bilateral hand tremors present, pt required support with 1 HHA, would likely perform well with RW but declined bringing RW to room (RN aware of recommendation) Ambulation Distance (Feet): 240 Feet Assistive device: 1 person hand held assist Gait Pattern: Step-through pattern;Decreased stride length;Decreased trunk rotation    Exercise    End of Session PT - End of Session Activity Tolerance: Patient tolerated treatment well Patient left: in bed Nurse Communication: Mobility status for ambulation;Other (comment) (use RW) General Behavior During Session: Flat affect  Ceili Boshers,KATHrine E 09/06/2011, 2:07 PM Pager: [phone]

## 2011-09-06 NOTE — Consult Note (Signed)
Reason for Consult:Tremor Referring Physician: Readling  CC: Tremor  HPI: [name] is an 61 y.o. male who complains of tremor. He reports that he has had a tremor for many years.  IT seems to have fluctuated in intensity over the years related to whether he has been on Lithium or not. He reports it was not as bad when he was on Depakote alone.  Since this current hospitalization his tremor has gotten worse.    Past Medical History  Diagnosis Date  . Renal cell carcinoma     chemo and xrt--lost rt kidney  . Childhood asthma   . Sinusitis   . Cellulitis     after left foot surgery  . Osteoarthritis   . OSA (obstructive sleep apnea)     NPSG 05-08-07 AHI 63.1/hr,desat t0 80%/loud snoring  . Bipolar 1 disorder   . Hyperlipemia   . Mental disorder     Past Surgical History  Procedure Date  . Podiatric surgery     left foot toes,   . Bunionectomy   . Nephrectomy 1998    rt., d/t cancer  . Cholecystectomy   . Colon surgery     Family History  Problem Relation Age of Onset  . Hypertension    . Sleep apnea    . Emphysema    . Prostate cancer      Social History:  reports that he has never smoked. He does not have any smokeless tobacco history on file. He reports that he drinks alcohol. He reports that he does not use illicit drugs.  No Known Allergies  Medications:  I have reviewed the patient's current medications. Prior to Admission:  Prescriptions prior to admission  Medication Sig Dispense Refill  . ARIPiprazole (ABILIFY) 5 MG tablet Take 3 tablets (15 mg total) by mouth daily. For mood control.  1 tablet  0  . Coenzyme Q10 (COQ10) 100 MG CAPS Take 1 capsule by mouth daily. For nutritional supplementation.  30 each  0  . divalproex (DEPAKOTE ER) 250 MG 24 hr tablet Take 500-1,000 mg by mouth 2 (two) times daily. Take by mouth 2 in AM and 4 at Bedtime.For mood control.      . mirtazapine (REMERON) 30 MG tablet Take by mouth ONE FOURTH tablet at bed time For  insomnia.  1 tablet  0  . Multiple Vitamin (MULTIVITAMIN) tablet Take 1 tablet by mouth daily. For nutritional supplementation.  30 tablet  0  . Omega-3 Fatty Acids (FISH OIL) 1200 MG CAPS Take 1 capsule (1,200 mg total) by mouth daily. For depression and brain health  30 capsule  0  . polyethylene glycol (MIRALAX / GLYCOLAX) packet Take 17 g by mouth daily. For constipation  28 each  0  . pravastatin (PRAVACHOL) 40 MG tablet Take 1 tablet (40 mg total) by mouth daily. To help lower cholesterol.  30 tablet  0  . prazosin (MINIPRESS) 2 MG capsule Take 1 capsule (2 mg total) by mouth at bedtime. For insomnia. May repeat, but be careful getting up in the middle of the night.  This may lower blood pressure and make you dizzy if you stand up quickly while it is  40 capsule  0  . testosterone enanthate (DELATESTRYL) 200 MG/ML injection Inject 1 mL (200 mg total) into the muscle every 14 (fourteen) days. Apply as directed.  Patient last received an injection on 08/13/11  5 mL  0  . zolpidem (AMBIEN) 10 MG tablet Take 10 mg  by mouth at bedtime as needed. For sleep       Scheduled:   . amLODipine  5 mg Oral Daily  . feeding supplement  237 mL Oral TID WC  . lithium carbonate  300 mg Oral QHS  . mirtazapine  30 mg Oral QHS  . multivitamin animal shapes (with Ca/FA)  1 tablet Oral Daily  . OLANZapine zydis  5 mg Oral QHS  . polyethylene glycol  17 g Oral Daily  . propranolol  20 mg Oral BID  . sertraline  100 mg Oral Daily  . sodium phosphate  1 enema Rectal Once  . sodium phosphate  1 enema Rectal Once  . Valproic Acid  250 mg Oral BID   And  . Valproic Acid  500 mg Oral QHS  . DISCONTD: senna-docusate  2 tablet Oral BID  . DISCONTD: sertraline  50 mg Oral Daily    ROS: History obtained from the patient  General ROS: negative for - chills, fatigue, fever, night sweats, weight gain or weight loss Psychological ROS: negative for - behavioral disorder, hallucinations, memory difficulties, mood  swings or suicidal ideation Ophthalmic ROS: negative for - blurry vision, double vision, eye pain or loss of vision ENT ROS: negative for - epistaxis, nasal discharge, oral lesions, sore throat, tinnitus or vertigo Allergy and Immunology ROS: negative for - hives or itchy/watery eyes Hematological and Lymphatic ROS: negative for - bleeding problems, bruising or swollen lymph nodes Endocrine ROS: negative for - galactorrhea, hair pattern changes, polydipsia/polyuria or temperature intolerance Respiratory ROS: negative for - cough, hemoptysis, shortness of breath or wheezing Cardiovascular ROS: negative for - chest pain, dyspnea on exertion, edema or irregular heartbeat Gastrointestinal ROS: negative for - abdominal pain, diarrhea, hematemesis, nausea/vomiting or stool incontinence Genito-Urinary ROS: negative for - dysuria, hematuria, incontinence or urinary frequency/urgency Musculoskeletal ROS: negative for - joint swelling or muscular weakness Neurological ROS: as noted in HPI Dermatological ROS: negative for rash and skin lesion changes Physical Examination: Blood pressure 154/87, pulse 69, temperature 98.7 F (37.1 C), temperature source Oral, resp. rate 16, SpO2 97.00%.  Neurologic Examination Mental Status: Masked facies.  Alert, oriented, thought content appropriate.  Speech fluent without evidence of aphasia.  Able to follow 3 step commands without difficulty. Cranial Nerves: II: visual fields grossly normal, pupils equal, round, reactive to light and accommodation III,IV, VI: ptosis not present, extra-ocular motions intact bilaterally V,VII: smile symmetric, facial light touch sensation normal bilaterally VIII: hearing normal bilaterally IX,X: gag reflex present XI: trapezius strength/neck flexion strength normal bilaterally XII: tongue strength normal  Motor: Right : Upper extremity   5/5    Left:     Upper extremity   5/5  Lower extremity   5/5     Lower extremity   5/5 Tone  increased bilaterally, left upper extremity greater than right Sensory: Pinprick and light touch intact throughout, bilaterally Deep Tendon Reflexes: 2+ and symmetric throughout Plantars: Right: downgoing   Left: downgoing Cerebellar: Normal finger-to-nose and normal heel-to-shin test.  Action tremor noted of the bilateral upper extremity.  Mild head tremor noted as well.  Gait with a mild limp but no shuffling appreciated.   Results for orders placed during the hospital encounter of 09/01/11 (from the past 48 hour(s))  VALPROIC ACID LEVEL     Status: Normal   Collection Time   09/05/11  7:47 PM      Component Value Range Comment   Valproic Acid Lvl 52.9  50.0 - 100.0 (ug/mL)  VITAMIN B12     Status: Normal   Collection Time   09/05/11  7:47 PM      Component Value Range Comment   Vitamin B-12 544  211 - 911 (pg/mL)   RPR     Status: Normal   Collection Time   09/05/11  7:47 PM      Component Value Range Comment   RPR NON REACTIVE  NON REACTIVE      Assessment/Plan:  Patient Active Hospital Problem List: Tremor   Assessment: Patient with long-standing tremor.  Likely related to medications-current and past history-has been on antipsychotics in the past.  Clearly worse per patient with the addition of Lithium while hospitalized.   Plan:  1. If possible would consider adjustment of Lithium.  If this is impossible would consider Benztropine which may be beneficial to control medication related side effects.  Will likely not completely ablate tremor but patient has dealt with some degree of tremor for many years.  2. Lithium level  No further neurologic intervention is recommended at this time.  If further questions arise, please call or page at that time.  Thank you for allowing neurology to participate in the care of this patient.  Thana Farr, MD [facility] [phone] 09/06/2011  9:23 AM   Thana Farr, MD [facility] [phone] 09/06/2011,  8:59 AM

## 2011-09-06 NOTE — Progress Notes (Signed)
[facility] Group Notes:  (Counselor/Nursing/MHT/Case Management/Adjunct)    Type of Therapy:  Group Therapy  Participation Level:  Did Not Attend      Billie Lade 09/06/2011  8:35 AM

## 2011-09-06 NOTE — ED Notes (Addendum)
Transferred from [facility] with c/o sudden onset weakness after receiving lithium. Became nonverbal, speech is garbled, nonsensical. Does follow commands. Weak but equal grips, no facial droop obvious.  ------------------------------- Please note:  The patient has been taking Lithium for many years which was recently discontinued.  The patient was restarted on Lithium 300 mgs per day yesterday and received only one dosage which was given last night prior to this event.  The patient's suddenly became non-verbal with inability to speak but could acknowledge by nodding his head.  This sudden occurred happened 16 hours after receiving his only dosage of Lithium and occurred after interacting well with family and staff at lunch time today.  Please read my note from 3:42 pm.

## 2011-09-06 NOTE — Progress Notes (Signed)
[facility] Group Notes:  (Counselor/Nursing/MHT/Case Management/Adjunct) 1:15PM    Type of Therapy:  Group Therapy  Participation Level:  Did Not Attend   Shalayne Leach Taree Hayes 09/06/2011  2:45 PM 

## 2011-09-06 NOTE — ED Notes (Signed)
Sitter at bedside with pt.

## 2011-09-06 NOTE — ED Notes (Signed)
Wife and sitter at bedside. Pt awake. Responds to verbal stimuli. No change in mentation.

## 2011-09-06 NOTE — ED Notes (Signed)
MD at bedside. 

## 2011-09-06 NOTE — ED Provider Notes (Signed)
History     CSN: [id number]  Arrival date & time 09/06/11  1642   First MD Initiated Contact with Patient 09/06/11 1716      CC: gradual onset weakness and ams  (Consider location/radiation/quality/duration/timing/severity/associated sxs/prior treatment) HPI This 61 year old male is unable to give a history due to his altered mental status, he is an inpatient at the [facility] and was sent to the ED because of the last couple of weeks since he was hospitalized he has had gradually worsening generalized weakness with decreased talking, he has baseline generalized tremors, he has been quite depressed, he was started on lithium last night and was not talking this afternoon so he was sent to the emergency department for evaluation, upon arrival he will talk in one word answers he will follow simple commands he denies any pain or shortness of breath he denies any change in vision his family states he is spoken very little over the last 2 weeks from his depression, he is mildly confused being oriented to person and place but not to time, he denies lateralizing weakness or numbness but appears generally weak. Past Medical History  Diagnosis Date  . Renal cell carcinoma     chemo and xrt--lost rt kidney  . Childhood asthma   . Sinusitis   . Cellulitis     after left foot surgery  . Osteoarthritis   . OSA (obstructive sleep apnea)     NPSG 05-08-07 AHI 63.1/hr,desat t0 80%/loud snoring  . Bipolar 1 disorder   . Hyperlipemia   . Mental disorder     Past Surgical History  Procedure Date  . Podiatric surgery     left foot toes,   . Bunionectomy   . Nephrectomy 1998    rt., d/t cancer  . Cholecystectomy   . Colon surgery     Family History  Problem Relation Age of Onset  . Hypertension    . Sleep apnea    . Emphysema    . Prostate cancer      History  Substance Use Topics  . Smoking status: Never Smoker   . Smokeless tobacco: Never Used  . Alcohol Use: Yes   6 beers a uear      Review of Systems  Unable to perform ROS: Mental status change    Allergies  Review of patient's allergies indicates no known allergies.  Home Medications   No current outpatient prescriptions on file.  BP 159/88  Pulse 62  Temp(Src) 98.7 F (37.1 C) (Oral)  Resp 18  SpO2 96%  Physical Exam  Nursing note and vitals reviewed. Constitutional:       Awake, alert, nontoxic appearance with baseline speech for patient.  HENT:  Head: Atraumatic.  Mouth/Throat: No oropharyngeal exudate.  Eyes: EOM are normal. Pupils are equal, round, and reactive to light. Right eye exhibits no discharge. Left eye exhibits no discharge.  Neck: Neck supple.  Cardiovascular: Normal rate and regular rhythm.   No murmur heard. Pulmonary/Chest: Effort normal and breath sounds normal. No stridor. No respiratory distress. He has no wheezes. He has no rales. He exhibits no tenderness.  Abdominal: Soft. Bowel sounds are normal. He exhibits no mass. There is no tenderness. There is no rebound.  Musculoskeletal: He exhibits no tenderness.       Baseline ROM, moves extremities with no obvious new focal weakness.  Lymphadenopathy:    He has no cervical adenopathy.  Neurological: He is alert.       Awake, alert,  cooperative; motor strength bilaterally; sensation normal to light touch bilaterally; peripheral visual fields full to confrontation; no facial asymmetry; tongue midline; major cranial nerves appear intact; no pronator drift, normal finger to nose bilaterally; oriented to person and place but not time; appears generally weak  Skin: No rash noted.  Psychiatric: He has a normal mood and affect.    ED Course  Procedures (including critical care time) ECG: Sinus rhythm, ventricular rate 59, normal axis, no acute ischemic changes noted, no significant change compared with October 2009 Labs Reviewed  CBC - Abnormal; Notable for the following:    Platelets 110 (*)    All other  components within normal limits  COMPREHENSIVE METABOLIC PANEL - Abnormal; Notable for the following:    Glucose, Bld 100 (*)    BUN 25 (*)    Creatinine, Ser 2.04 (*)    GFR calc non Af Amer 34 (*)    GFR calc Af Amer 39 (*)    All other components within normal limits  FOLATE RBC - Abnormal; Notable for the following:    RBC Folate 1925 (*) Reference range not established for pediatric patients.   All other components within normal limits  COMPREHENSIVE METABOLIC PANEL - Abnormal; Notable for the following:    Glucose, Bld 103 (*)    BUN 28 (*)    Creatinine, Ser 2.00 (*)    GFR calc non Af Amer 35 (*)    GFR calc Af Amer 40 (*)    All other components within normal limits  CBC - Abnormal; Notable for the following:    Hemoglobin 17.1 (*)    Platelets 138 (*)    All other components within normal limits  DIFFERENTIAL - Abnormal; Notable for the following:    Lymphocytes Relative 11 (*)    All other components within normal limits  LITHIUM LEVEL - Abnormal; Notable for the following:    Lithium Lvl <0.25 (*)    All other components within normal limits  SALICYLATE LEVEL - Abnormal; Notable for the following:    Salicylate Lvl <2.0 (*)    All other components within normal limits  BLOOD GAS, ARTERIAL - Abnormal; Notable for the following:    pO2, Arterial 104.0 (*)    Bicarbonate 28.6 (*)    Acid-Base Excess 3.8 (*)    All other components within normal limits  VALPROIC ACID LEVEL  URINALYSIS, ROUTINE W REFLEX MICROSCOPIC  CEA  VALPROIC ACID LEVEL  VITAMIN B12  RPR  URINALYSIS, ROUTINE W REFLEX MICROSCOPIC  AMMONIA  ACETAMINOPHEN LEVEL  TSH  POCT I-STAT TROPONIN I  LAB REPORT - SCANNED   Dg Chest 2 View  09/06/2011  *RADIOLOGY REPORT*  Clinical Data: Altered mental status.  CHEST - 2 VIEW  Comparison: Single view of the chest 04/14/2008.  Findings: Lung volumes are lower on the comparison study with some streaky left basilar airspace opacity.  Right lung is clear.   Heart size is normal. No pneumothorax or effusion.  IMPRESSION: Streaky left basilar airspace opacity is likely due to atelectasis in this low-volume chest.  Original Report Authenticated By: Bernadene Bell. D'ALESSIO, M.D.   Dg Abd 1 View  09/07/2011  *RADIOLOGY REPORT*  Clinical Data: Abdominal distention, history renal cancer  ABDOMEN - 1 VIEW  Comparison: None Correlation:  CT abdomen pelvis 09/03/2011  Findings: Surgical clips right mid abdomen corresponding to prior nephrectomy. Slight gaseous distention of colon. No retained contrast in colon. Displacement of bowel loops from pelvis likely a distended urinary  bladder. No definite evidence of bowel obstruction, bowel wall thickening, or free intraperitoneal air. Lung bases grossly clear. Bones appear demineralized.  IMPRESSION: Scattered gas and minimal stool throughout distended colon without definite evidence of bowel obstruction, pattern similar to the prior CT. Displacement of bowel loops from the pelvis suggesting a distended urinary bladder.  Original Report Authenticated By: Lollie Marrow, M.D.   Ct Head Wo Contrast  09/06/2011  *RADIOLOGY REPORT*  Clinical Data: Altered mental status.  CT HEAD WITHOUT CONTRAST  Technique:  Contiguous axial images were obtained from the base of the skull through the vertex without contrast.  Comparison: 08/07/2006  Findings: There is no evidence of intracranial hemorrhage, brain edema or other signs of acute infarction.  There is no evidence of intracranial mass lesion or mass effect.  No abnormal extra-axial fluid collections are identified.  Mild diffuse cerebral atrophy shows no significant change.  No evidence of hydrocephalus.  No skull fracture or other bone abnormality identified.  IMPRESSION:  1.  No acute intracranial abnormality. 2.  Mild cerebral atrophy.  Original Report Authenticated By: Danae Orleans, M.D.     1. Bipolar I disorder, most recent episode (or current) depressed   2. Altered mental status    3. Weakness       MDM  The patient appears reasonably stabilized for admission considering the current resources, flow, and capabilities available in the ED at this time, and I doubt any other [facility] requiring further screening and/or treatment in the ED prior to admission.Patient / Family / Caregiver informed of clinical course, understand medical decision-making process, and agree with plan.       Hurman Horn, MD 09/07/11 1328

## 2011-09-07 ENCOUNTER — Inpatient Hospital Stay (HOSPITAL_COMMUNITY)
Admission: EM | Admit: 2011-09-07 | Discharge: 2011-09-09 | DRG: 392 | Disposition: A | Discharge status: Home / Self Care | Payer: 59 | Source: Other Acute Inpatient Hospital | Attending: Family Medicine | Admitting: Family Medicine

## 2011-09-07 ENCOUNTER — Encounter (HOSPITAL_COMMUNITY): Payer: Self-pay | Admitting: *Deleted

## 2011-09-07 ENCOUNTER — Inpatient Hospital Stay (HOSPITAL_COMMUNITY): Payer: 59

## 2011-09-07 DIAGNOSIS — M1990 Unspecified osteoarthritis, unspecified site: Secondary | ICD-10-CM | POA: Diagnosis present

## 2011-09-07 DIAGNOSIS — M7750 Other enthesopathy of unspecified foot: Secondary | ICD-10-CM

## 2011-09-07 DIAGNOSIS — E785 Hyperlipidemia, unspecified: Secondary | ICD-10-CM

## 2011-09-07 DIAGNOSIS — Z85528 Personal history of other malignant neoplasm of kidney: Secondary | ICD-10-CM

## 2011-09-07 DIAGNOSIS — F3160 Bipolar disorder, current episode mixed, unspecified: Secondary | ICD-10-CM

## 2011-09-07 DIAGNOSIS — F319 Bipolar disorder, unspecified: Secondary | ICD-10-CM

## 2011-09-07 DIAGNOSIS — N183 Chronic kidney disease, stage 3 unspecified: Secondary | ICD-10-CM

## 2011-09-07 DIAGNOSIS — R0989 Other specified symptoms and signs involving the circulatory and respiratory systems: Secondary | ICD-10-CM

## 2011-09-07 DIAGNOSIS — Z905 Acquired absence of kidney: Secondary | ICD-10-CM

## 2011-09-07 DIAGNOSIS — C649 Malignant neoplasm of unspecified kidney, except renal pelvis: Secondary | ICD-10-CM

## 2011-09-07 DIAGNOSIS — J45909 Unspecified asthma, uncomplicated: Secondary | ICD-10-CM

## 2011-09-07 DIAGNOSIS — G4733 Obstructive sleep apnea (adult) (pediatric): Secondary | ICD-10-CM

## 2011-09-07 DIAGNOSIS — R4182 Altered mental status, unspecified: Secondary | ICD-10-CM

## 2011-09-07 DIAGNOSIS — I129 Hypertensive chronic kidney disease with stage 1 through stage 4 chronic kidney disease, or unspecified chronic kidney disease: Secondary | ICD-10-CM | POA: Diagnosis present

## 2011-09-07 DIAGNOSIS — K5900 Constipation, unspecified: Principal | ICD-10-CM | POA: Diagnosis present

## 2011-09-07 LAB — CBC
MCHC: 34.4 g/dL (ref 30.0–36.0)
Platelets: 117 10*3/uL — ABNORMAL LOW (ref 150–400)
RDW: 13.4 % (ref 11.5–15.5)
WBC: 7 10*3/uL (ref 4.0–10.5)

## 2011-09-07 LAB — COMPREHENSIVE METABOLIC PANEL
ALT: 27 U/L (ref 0–53)
AST: 26 U/L (ref 0–37)
Albumin: 3.2 g/dL — ABNORMAL LOW (ref 3.5–5.2)
Alkaline Phosphatase: 56 U/L (ref 39–117)
BUN: 27 mg/dL — ABNORMAL HIGH (ref 6–23)
Chloride: 101 mEq/L (ref 96–112)
Potassium: 4.5 mEq/L (ref 3.5–5.1)
Sodium: 135 mEq/L (ref 135–145)
Total Bilirubin: 0.6 mg/dL (ref 0.3–1.2)
Total Protein: 6.6 g/dL (ref 6.0–8.3)

## 2011-09-07 LAB — GLUCOSE, CAPILLARY: Glucose-Capillary: 78 mg/dL (ref 70–99)

## 2011-09-07 MED ORDER — HEPARIN SODIUM (PORCINE) 5000 UNIT/ML IJ SOLN
5000.0000 [IU] | Freq: Three times a day (TID) | INTRAMUSCULAR | Status: DC
  Administered 2011-09-07 – 2011-09-09 (×7): 5000 [IU] via SUBCUTANEOUS
  Filled 2011-09-07 (×13): qty 1

## 2011-09-07 MED ORDER — ONDANSETRON HCL 4 MG/2ML IJ SOLN: 4.0000 mg | Freq: Four times a day (QID) | INTRAMUSCULAR | Status: DC | PRN

## 2011-09-07 MED ORDER — SODIUM CHLORIDE 0.9 % IV SOLN
INTRAVENOUS | Status: DC
  Administered 2011-09-07 – 2011-09-08 (×4): via INTRAVENOUS

## 2011-09-07 MED ORDER — POLYVINYL ALCOHOL 1.4 % OP SOLN
1.0000 [drp] | OPHTHALMIC | Status: DC | PRN
  Filled 2011-09-07: qty 15

## 2011-09-07 MED ORDER — BISACODYL 10 MG RE SUPP: 10.0000 mg | Freq: Every day | RECTAL | Status: DC | PRN

## 2011-09-07 MED ORDER — FLEET ENEMA 7-19 GM/118ML RE ENEM: 1.0000 | ENEMA | Freq: Once | RECTAL | Status: AC | PRN

## 2011-09-07 MED ORDER — SENNA 8.6 MG PO TABS
1.0000 | ORAL_TABLET | Freq: Two times a day (BID) | ORAL | Status: DC
  Administered 2011-09-07 – 2011-09-09 (×4): 8.6 mg via ORAL
  Filled 2011-09-07 (×4): qty 1

## 2011-09-07 MED ORDER — ONDANSETRON HCL 4 MG PO TABS: 4.0000 mg | ORAL_TABLET | Freq: Four times a day (QID) | ORAL | Status: DC | PRN

## 2011-09-07 MED ORDER — DOCUSATE SODIUM 100 MG PO CAPS
100.0000 mg | ORAL_CAPSULE | Freq: Two times a day (BID) | ORAL | Status: DC
  Administered 2011-09-07 – 2011-09-09 (×5): 100 mg via ORAL
  Filled 2011-09-07 (×8): qty 1

## 2011-09-07 MED ORDER — ACETAMINOPHEN 650 MG RE SUPP: 650.0000 mg | Freq: Four times a day (QID) | RECTAL | Status: DC | PRN

## 2011-09-07 MED ORDER — ACETAMINOPHEN 325 MG PO TABS
650.0000 mg | ORAL_TABLET | Freq: Four times a day (QID) | ORAL | Status: DC | PRN
Start: 2011-09-07 — End: 2011-09-09

## 2011-09-07 MED ORDER — AMLODIPINE BESYLATE 5 MG PO TABS
5.0000 mg | ORAL_TABLET | Freq: Every day | ORAL | Status: DC
  Administered 2011-09-07 – 2011-09-09 (×3): 5 mg via ORAL
  Filled 2011-09-07 (×4): qty 1

## 2011-09-07 MED ORDER — SODIUM CHLORIDE 0.9 % IJ SOLN
3.0000 mL | Freq: Two times a day (BID) | INTRAMUSCULAR | Status: DC
  Administered 2011-09-07: 03:00:00 via INTRAVENOUS
  Administered 2011-09-07: 3 mL via INTRAVENOUS

## 2011-09-07 NOTE — Plan of Care (Signed)
Problem: Phase I Progression Outcomes Goal: Pain controlled with appropriate interventions Outcome: Completed/Met Date Met:  09/07/11 Denies pain  Goal: Other Phase I Outcomes/Goals Outcome: Progressing Able to progress diet

## 2011-09-07 NOTE — Progress Notes (Signed)
Subjective: Patient lying in bed with eyes closed but per report of wife has been talking today and seems less sedated. MRI of the brain remains pending.  Objective: Current vital signs: BP 151/78  Pulse 65  Temp(Src) 99.4 F (37.4 C) (Axillary)  Resp 18  SpO2 98% Vital signs in last 24 hours: Temp:  [99.4 F (37.4 C)] 99.4 F (37.4 C) (03/23 0558) Pulse Rate:  [61-65] 65  (03/23 0558) Resp:  [18-19] 18  (03/23 0558) BP: (151-171)/(78-105) 151/78 mmHg (03/23 0558) SpO2:  [96 %-100 %] 98 % (03/23 0558)  Intake/Output from previous day: 03/22 0701 - 03/23 0700 In: 283.3 [I.V.:283.3] Out: 450 [Urine:450] Intake/Output this shift: Total I/O In: -  Out: 850 [Urine:850] Nutritional status: NPO  Neurologic Exam: Mental Status: Alert, oriented, thought content appropriate.  Speech fluent without evidence of aphasia.  Able to follow 3 step commands without difficulty. Cranial Nerves: II: Pupils equal, round, reactive to light and accommodation III,IV, VI: ptosis not present, extra-ocular motions intact bilaterally V,VII: smile symmetric, facial light touch sensation normal bilaterally VIII: hearing normal bilaterally IX,X: gag reflex present XI: trapezius strength/neck flexion strength normal bilaterally XII: tongue strength normal  Motor: Right : Upper extremity   5/5    Left:     Upper extremity   5/5  Lower extremity   5/5     Lower extremity   5/5 Tone and bulk:normal tone throughout; no atrophy noted Sensory: Pinprick and light touch intact throughout, bilaterally Deep Tendon Reflexes: 2+ and symmetric throughout Plantars: Right: downgoing   Left: downgoing Cerebellar: normal finger-to-nose and normal heel-to-shin test  Lab Results: Results for orders placed during the hospital encounter of 09/07/11 (from the past 48 hour(s))  CBC     Status: Abnormal   Collection Time   09/07/11  6:10 AM      Component Value Range Comment   WBC 7.0  4.0 - 10.5 (K/uL)    RBC 4.82   4.22 - 5.81 (MIL/uL)    Hemoglobin 15.6  13.0 - 17.0 (g/dL)    HCT 03.4  74.2 - 59.5 (%)    MCV 94.2  78.0 - 100.0 (fL)    MCH 32.4  26.0 - 34.0 (pg)    MCHC 34.4  30.0 - 36.0 (g/dL)    RDW 63.8  75.6 - 43.3 (%)    Platelets 117 (*) 150 - 400 (K/uL) PLATELET COUNT CONFIRMED BY SMEAR  COMPREHENSIVE METABOLIC PANEL     Status: Abnormal   Collection Time   09/07/11  6:10 AM      Component Value Range Comment   Sodium 135  135 - 145 (mEq/L)    Potassium 4.5  3.5 - 5.1 (mEq/L)    Chloride 101  96 - 112 (mEq/L)    CO2 29  19 - 32 (mEq/L)    Glucose, Bld 79  70 - 99 (mg/dL)    BUN 27 (*) 6 - 23 (mg/dL)    Creatinine, Ser 2.95 (*) 0.50 - 1.35 (mg/dL)    Calcium 8.9  8.4 - 10.5 (mg/dL)    Total Protein 6.6  6.0 - 8.3 (g/dL)    Albumin 3.2 (*) 3.5 - 5.2 (g/dL)    AST 26  0 - 37 (U/L)    ALT 27  0 - 53 (U/L)    Alkaline Phosphatase 56  39 - 117 (U/L)    Total Bilirubin 0.6  0.3 - 1.2 (mg/dL)    GFR calc non Af Amer 35 (*) >90 (  mL/min)    GFR calc Af Amer 41 (*) >90 (mL/min)   GLUCOSE, CAPILLARY     Status: Normal   Collection Time   09/07/11  7:23 AM      Component Value Range Comment   Glucose-Capillary 78  70 - 99 (mg/dL)    Comment 1 Notify RN       No results found for this or any previous visit (from the past 240 hour(s)).  Lipid Panel No results found for this basename: CHOL,TRIG,HDL,CHOLHDL,VLDL,LDLCALC in the last 72 hours  Studies/Results: Dg Chest 2 View  09/06/2011  *RADIOLOGY REPORT*  Clinical Data: Altered mental status.  CHEST - 2 VIEW  Comparison: Single view of the chest 04/14/2008.  Findings: Lung volumes are lower on the comparison study with some streaky left basilar airspace opacity.  Right lung is clear.  Heart size is normal. No pneumothorax or effusion.  IMPRESSION: Streaky left basilar airspace opacity is likely due to atelectasis in this low-volume chest.  Original Report Authenticated By: Bernadene Bell. D'ALESSIO, M.D.   Dg Abd 1 View  09/07/2011  *RADIOLOGY  REPORT*  Clinical Data: Abdominal distention, history renal cancer  ABDOMEN - 1 VIEW  Comparison: None Correlation:  CT abdomen pelvis 09/03/2011  Findings: Surgical clips right mid abdomen corresponding to prior nephrectomy. Slight gaseous distention of colon. No retained contrast in colon. Displacement of bowel loops from pelvis likely a distended urinary bladder. No definite evidence of bowel obstruction, bowel wall thickening, or free intraperitoneal air. Lung bases grossly clear. Bones appear demineralized.  IMPRESSION: Scattered gas and minimal stool throughout distended colon without definite evidence of bowel obstruction, pattern similar to the prior CT. Displacement of bowel loops from the pelvis suggesting a distended urinary bladder.  Original Report Authenticated By: Lollie Marrow, M.D.   Ct Head Wo Contrast  09/06/2011  *RADIOLOGY REPORT*  Clinical Data: Altered mental status.  CT HEAD WITHOUT CONTRAST  Technique:  Contiguous axial images were obtained from the base of the skull through the vertex without contrast.  Comparison: 08/07/2006  Findings: There is no evidence of intracranial hemorrhage, brain edema or other signs of acute infarction.  There is no evidence of intracranial mass lesion or mass effect.  No abnormal extra-axial fluid collections are identified.  Mild diffuse cerebral atrophy shows no significant change.  No evidence of hydrocephalus.  No skull fracture or other bone abnormality identified.  IMPRESSION:  1.  No acute intracranial abnormality. 2.  Mild cerebral atrophy.  Original Report Authenticated By: Danae Orleans, M.D.    Medications:  I have reviewed the patient's current medications. Scheduled:   . docusate sodium  100 mg Oral BID  . heparin  5,000 Units Subcutaneous Q8H  . senna  1 tablet Oral BID  . sodium chloride  3 mL Intravenous Q12H    Assessment/Plan:  Patient Active Hospital Problem List:  Altered mental status (09/06/2011)   Assessment: Likely  related to psychiatric issues.  Patient talking and moving all extremities today.  Family concerned about medications.     Plan:  1.  MRI of the brain pending.  Patient does not want to have the test because he does not  Feel that it is necessary.  If he does not consent to the MRI or if the MRI does not show any acute changes no further intervention is recommended.  Will follow up results if performed.    LOS: 0 days   Thana Farr, MD [facility] [phone] 09/07/2011  12:33 PM

## 2011-09-07 NOTE — Progress Notes (Deleted)
Clinical Social Worker received a call from patient's attending physician regarding discharge plans. CSW advised (based on consults) and MD is aware that patient's wife/family does not want him to return to [facility]. They are concerned about his medications. Dr. Laural Benes advised that he will order a psych consult to evaluate patient and the meds he is currently taking for his psychiatric disorder. The assigned clinical social worker will continue to monitor and f/u with patient regarding discharge after psych consult.  Genelle Bal, MSW, LCSW [phone] Weekend coverage

## 2011-09-07 NOTE — Progress Notes (Signed)
[facility] Inpatient Progress Note  09/07/2011  Subjective: Pt's wife and mother in room with patient.  They report that pt is more alert.  They have declined to get the MRI because they don't feel it is necessary and feel he's being overmedicated.  Pt was seen by neurology this morning.    Objective:  Vital signs in last 24 hours: Filed Vitals:   09/07/11 0558  BP: 151/78  Pulse: 65  Temp: 99.4 F (37.4 C)  TempSrc: Axillary  Resp: 18  SpO2: 98%   Weight change:   Intake/Output Summary (Last 24 hours) at 09/07/11 1413 Last data filed at 09/07/11 1100  Gross per 24 hour  Intake 283.33 ml  Output   1300 ml  Net -1016.67 ml   No results found for this basename: HGBA1C   Lab Results  Component Value Date   CREATININE 1.98* 09/07/2011    Review of Systems As above, otherwise all reviewed and reported negative  Physical Exam General - awake, alert, no distress Lungs - BBS clear CV - normal s1, s2 sounds Abd - soft, normal BS, nondistended Ext -no cyanosis or clubbing  Lab Results: Results for orders placed during the hospital encounter of 09/07/11 (from the past 24 hour(s))  CBC     Status: Abnormal   Collection Time   09/07/11  6:10 AM      Component Value Range   WBC 7.0  4.0 - 10.5 (K/uL)   RBC 4.82  4.22 - 5.81 (MIL/uL)   Hemoglobin 15.6  13.0 - 17.0 (g/dL)   HCT 16.1  09.6 - 04.5 (%)   MCV 94.2  78.0 - 100.0 (fL)   MCH 32.4  26.0 - 34.0 (pg)   MCHC 34.4  30.0 - 36.0 (g/dL)   RDW 40.9  81.1 - 91.4 (%)   Platelets 117 (*) 150 - 400 (K/uL)  COMPREHENSIVE METABOLIC PANEL     Status: Abnormal   Collection Time   09/07/11  6:10 AM      Component Value Range   Sodium 135  135 - 145 (mEq/L)   Potassium 4.5  3.5 - 5.1 (mEq/L)   Chloride 101  96 - 112 (mEq/L)   CO2 29  19 - 32 (mEq/L)   Glucose, Bld 79  70 - 99 (mg/dL)   BUN 27 (*) 6 - 23 (mg/dL)   Creatinine, Ser 7.82 (*) 0.50 - 1.35 (mg/dL)   Calcium 8.9  8.4 - 95.6 (mg/dL)   Total Protein 6.6  6.0  - 8.3 (g/dL)   Albumin 3.2 (*) 3.5 - 5.2 (g/dL)   AST 26  0 - 37 (U/L)   ALT 27  0 - 53 (U/L)   Alkaline Phosphatase 56  39 - 117 (U/L)   Total Bilirubin 0.6  0.3 - 1.2 (mg/dL)   GFR calc non Af Amer 35 (*) >90 (mL/min)   GFR calc Af Amer 41 (*) >90 (mL/min)  GLUCOSE, CAPILLARY     Status: Normal   Collection Time   09/07/11  7:23 AM      Component Value Range   Glucose-Capillary 78  70 - 99 (mg/dL)   Comment 1 Notify RN      Micro Results: No results found for this or any previous visit (from the past 240 hour(s)).  Medications:  Scheduled Meds:   . docusate sodium  100 mg Oral BID  . heparin  5,000 Units Subcutaneous Q8H  . senna  1 tablet Oral BID  . sodium chloride  3 mL Intravenous Q12H   Continuous Infusions:   . sodium chloride 100 mL/hr at 09/07/11 1203   PRN Meds:.acetaminophen, acetaminophen, bisacodyl, ondansetron (ZOFRAN) IV, ondansetron, sodium phosphate  Assessment/Plan: AMS  - pt back to his baseline now per family, they are concerned that he is overmedicated.  I placed a consult to inpatient psychiatry team for evaluation.  They will assess him tomorrow and leave recommendations. Family says they don't want pt to go back to [facility].  I called and requested assistance from SW.   Bipolar Disorder - as above  CRI - pt being gently hydrated. Recheck Cr in AM.   Chronic Constipation - colace oral caps ordered  HTN - resume home amlodipine.    LOS: 0 days   Vivek Grealish 09/07/2011, 2:13 PM   Cleora Fleet, MD, CDE, FAAFP [facility] [place], [place]  [phone]

## 2011-09-07 NOTE — Progress Notes (Signed)
CSW received a call from attending physician regarding discharge plans for patient & returning to [facility]. CSW advised and physician aware that patient/family does not want him to return to [facility]. Attending MD reported that the family is concerned about his medications. Dr. Laural Benes advised that he will order a psych consult before making a discharge decision to another facility or home. Assigned clinical social worker will continue to monitor and f/u with discharge plans after psych consult.  Genelle Bal, MSW, LCSW [phone] Weekend coverage

## 2011-09-08 DIAGNOSIS — F319 Bipolar disorder, unspecified: Secondary | ICD-10-CM

## 2011-09-08 LAB — GLUCOSE, CAPILLARY

## 2011-09-08 MED ORDER — LACTULOSE 10 GM/15ML PO SOLN
20.0000 g | Freq: Two times a day (BID) | ORAL | Status: DC
  Administered 2011-09-08 – 2011-09-09 (×3): 20 g via ORAL
  Filled 2011-09-08 (×6): qty 30

## 2011-09-08 NOTE — Progress Notes (Signed)
[facility] Inpatient Progress Note  09/08/2011  Subjective: Pt was seen by psychiatry today.  Pt has been much more alert but still depressed and withdrawn.  He is having significant problem with constipation.     Objective:  Vital signs in last 24 hours: Filed Vitals:   09/07/11 1501 09/07/11 1557 09/07/11 2138 09/08/11 0618  BP: 139/116 138/78 128/69 153/81  Pulse: 58  64 60  Temp: 98.2 F (36.8 C)  97.6 F (36.4 C) 97.9 F (36.6 C)  TempSrc: Oral  Oral Oral  Resp: 20  20 20   SpO2: 98%  97% 97%   Weight change:   Intake/Output Summary (Last 24 hours) at 09/08/11 1320 Last data filed at 09/08/11 0800  Gross per 24 hour  Intake    360 ml  Output   1150 ml  Net   -790 ml   No results found for this basename: HGBA1C   Lab Results  Component Value Date   CREATININE 1.98* 09/07/2011    Review of Systems As above, otherwise all reviewed and reported negative  Physical Exam General - awake, alert, no distress  Lungs - BBS clear  CV - normal s1, s2 sounds  Abd - soft, normal BS, nondistended  Ext -no cyanosis or clubbing Psych - flat affect  Lab Results: Results for orders placed during the hospital encounter of 09/07/11 (from the past 24 hour(s))  GLUCOSE, CAPILLARY     Status: Normal   Collection Time   09/08/11  7:32 AM      Component Value Range   Glucose-Capillary 75  70 - 99 (mg/dL)   Comment 1 Notify RN      Micro Results: No results found for this or any previous visit (from the past 240 hour(s)).  Medications:  Scheduled Meds:   . amLODipine  5 mg Oral Daily  . docusate sodium  100 mg Oral BID  . heparin  5,000 Units Subcutaneous Q8H  . senna  1 tablet Oral BID  . sodium chloride  3 mL Intravenous Q12H   Continuous Infusions:   . sodium chloride 75 mL/hr at 09/08/11 1211   PRN Meds:.acetaminophen, acetaminophen, bisacodyl, ondansetron (ZOFRAN) IV, ondansetron, polyvinyl alcohol, sodium phosphate  Assessment/Plan: AMS - resolved  now, off all psychiatric medications  Bipolar Disorder - per psychiatry consult, pt will need inpatient psychiatric treatment, while on the medical floor he will be followed by the inpatient psychiatric services, per consult, pt can be transferred to [facility] or [facility] for inpatient psych services.    CRI s/p right nephrectomy - pt being gently hydrated.   Chronic Constipation - colace oral caps ordered add lactulose, also xray abd showed distended bladder, check bladder scan  HTN - resume home amlodipine, following BPs.   LOS: 1 day   Ihor Meinzer 09/08/2011, 1:20 PM   Cleora Fleet, MD, CDE, FAAFP [facility] [place], [place]  [phone]

## 2011-09-08 NOTE — Progress Notes (Signed)
Bladder scan read 200+ ccs urine in bladder, stood patient up and he voided 450 ccs urine in urinal.

## 2011-09-08 NOTE — Consult Note (Addendum)
[name] is a 61 y.o. male [id number] May 15, 1951  Subjective/Objective: Patient is 61 year old, Caucasian, married employed male, who [facility] due to increased depression and having suicidal thinking. Patient was later transferred to medical floor due to change in mental status. Patient has a long history of bipolar disorder. He was doing fine on lithium until 7 months ago. He was taken off due to nephrectomy an increase in his creatinine. As per wife patient having manic episodes, and severe depression and require inpatient treatment. During his stay at behavioral [facility] he was medicated with Zyprexa, Zoloft, Depakote, Remeron, and lithium. He was found lethargic and unresponsive and required admission. On medical floor. Patient is much better. However, family is refusing to get transferred back to [facility]. Wife and parents are concerned that patient was overly medicated. Patient seen and chart reviewed and discussed with the family in detail about the medication side effects, and prognosis. Patient still feel depressed, isolated, and withdrawn however, he is cooperative in conversation. He denies any active suicidal thinking at this time. I offered to try Lamictal which is alternative medication to lithium. I also feel that patient will get benefit for inpatient psychiatric treatment. I discussed if they do not want to go back on behavioral [facility] than they can try [facility] or [facility]. Though the Family like [facility] but this will be out of network of their insurance. Patient still endorse depression, increased anxiety and feeling of hopelessness. He denies any psychotic symptoms. He denies any agitation or anger. Currently he is off from all psychiatric medication.  Past psychiatric history. Patient has at least 3 psychiatric admission. Due to significant depression. He sees Dr. Hedy Jacob as outpatient psychiatrist. He  denies any history of suicidal attempt. However, endorse history of significant depression and mania. He has a good response to the lithium. However due to renal cancer. He was taken off from lithium.  Medical history. Recent change in mental status possibly due to polypharmacy and side effects of psychotropic medication, Renal carcinoma, sinusitis, asthma, cellulitis, osteoarthritis, sleep apnea, hyperlipidemia.  Social history. 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 His wife is very supportive. His parents live in [place] and very supportive.   Filed Vitals:   09/08/11 0618  BP: 153/81  Pulse: 60  Temp: 97.9 F (36.6 C)  Resp: 20    Lab Results:   BMET    Component Value Date/Time   NA 135 09/07/2011 0610   K 4.5 09/07/2011 0610   CL 101 09/07/2011 0610   CO2 29 09/07/2011 0610   GLUCOSE 79 09/07/2011 0610   BUN 27* 09/07/2011 0610   CREATININE 1.98* 09/07/2011 0610   CREATININE 2.14* 08/31/2011 1159   CALCIUM 8.9 09/07/2011 0610   GFRNONAA 35* 09/07/2011 0610   GFRAA 41* 09/07/2011 0610    Medications:  Scheduled:     . amLODipine  5 mg Oral Daily  . docusate sodium  100 mg Oral BID  . heparin  5,000 Units Subcutaneous Q8H  . senna  1 tablet Oral BID  . sodium chloride  3 mL Intravenous Q12H     PRN Meds acetaminophen, acetaminophen, bisacodyl, ondansetron (ZOFRAN) IV, ondansetron, polyvinyl alcohol, sodium phosphate  Assessment. Bipolar disorder, NOS. Axis II deferred. Axis III see medical history. Axis IV moderate. Axis V 40-45.  Plan As mentioned above, patient need inpatient treatment for continued of care. Family wants to be  transferred to the psychiatric hospital. Patient can be transferred to [facility] or [facility]. I also talked about starting Lamictal in the smaller dose if his not medically contraindicated. I explained the prognosis side effects of the medication in detail with the family. If patient  stays on a medical floor consultation liaison services can be called for followup.

## 2011-09-09 ENCOUNTER — Inpatient Hospital Stay (HOSPITAL_COMMUNITY)
Admission: EM | Admit: 2011-09-09 | Discharge: 2011-09-09 | Disposition: A | Discharge status: Home / Self Care | Payer: 59 | Source: Other Acute Inpatient Hospital | Attending: Internal Medicine | Admitting: Internal Medicine

## 2011-09-09 DIAGNOSIS — R4182 Altered mental status, unspecified: Secondary | ICD-10-CM

## 2011-09-09 LAB — GLUCOSE, CAPILLARY: Glucose-Capillary: 88 mg/dL (ref 70–99)

## 2011-09-09 MED ORDER — DSS 100 MG PO CAPS: 100.0000 mg | ORAL_CAPSULE | Freq: Two times a day (BID) | ORAL | Status: AC

## 2011-09-09 MED ORDER — FLEET ENEMA 7-19 GM/118ML RE ENEM
1.0000 | ENEMA | Freq: Once | RECTAL | Status: AC
  Administered 2011-09-09: 1 via RECTAL
  Filled 2011-09-09: qty 1

## 2011-09-09 MED ORDER — AMLODIPINE BESYLATE 10 MG PO TABS: 10.0000 mg | ORAL_TABLET | Freq: Every day | ORAL | Status: DC

## 2011-09-09 MED ORDER — LITHIUM CARBONATE ER 300 MG PO TBCR: 300.0000 mg | EXTENDED_RELEASE_TABLET | Freq: Every evening | ORAL | Status: DC

## 2011-09-09 MED ORDER — LACTULOSE 10 GM/15ML PO SOLN: 20.0000 g | Freq: Two times a day (BID) | ORAL | Status: DC | PRN

## 2011-09-09 NOTE — Discharge Instructions (Signed)

## 2011-09-09 NOTE — Consult Note (Signed)
Reason for Consult:Altered Mental Status Referring Physician:Dr. Jeanette Moffatt [name] is an 61 y.o. male.  HPI: Patient is 61 year old, Caucasian, married employed male, who [facility] due to increased depression and having suicidal thinking. Patient was later transferred to medical floor due to change in mental status. Patient has a long history of bipolar disorder. He was doing fine on lithium until 7 months ago. He was taken off due to nephrectomy an increase in his creatinine. As per wife patient having manic episodes, and severe depression and require inpatient treatment. During his stay at behavioral [facility] he was medicated with Zyprexa, Zoloft, Depakote, Remeron, and lithium. He was found lethargic and unresponsive and required admission. On medical floor. Patient is much better. However, family is refusing to get transferred back to [facility]. Wife and parents are concerned that patient was overly medicated. Patient seen and chart reviewed and discussed with the family in detail about the medication side effects, and prognosis. Patient still feel depressed, isolated, and withdrawn however, he is cooperative in conversation. He denies any active suicidal thinking at this time. I offered to try Lamictal which is alternative medication to lithium. I also feel that patient will get benefit for inpatient psychiatric treatment. I discussed if they do not want to go back on behavioral [facility] than they can try [facility] or [facility]. Though the Family likes [facility] but this will be out of network of their insurance. Patient still endorses depression, increased anxiety and feeling of hopelessness. He denies any psychotic symptoms. He denies any agitation or anger. Currently he is off from all psychiatric medication.  AXIS I Bipolar I Disorder, depression, severe most recent AXIS II Deferred AXIS III Past Medical History  Diagnosis  Date  . Renal cell carcinoma     chemo and xrt--lost rt kidney  . Childhood asthma   . Sinusitis   . Cellulitis     after left foot surgery  . Osteoarthritis   . OSA (obstructive sleep apnea)     NPSG 05-08-07 AHI 63.1/hr,desat t0 80%/loud snoring  . Bipolar 1 disorder   . Hyperlipemia   . Mental disorder     Past Surgical History  Procedure Date  . Podiatric surgery     left foot toes,   . Bunionectomy   . Nephrectomy 1998    rt., d/t cancer  . Cholecystectomy   . Colon surgery   AXIS IV  Supportive family, employment, finances, mental health  AXIS V  GAF 45  Family History  Problem Relation Age of Onset  . Hypertension    . Sleep apnea    . Emphysema    . Prostate cancer      Social History:  reports that he has never smoked. He has never used smokeless tobacco. He reports that he drinks alcohol. He reports that he does not use illicit drugs.  Allergies: No Known Allergies  Medications: I have reviewed the patient's current medications.  Results for orders placed during the hospital encounter of 09/07/11 (from the past 48 hour(s))  GLUCOSE, CAPILLARY     Status: Normal   Collection Time   09/08/11  7:32 AM      Component Value Range Comment   Glucose-Capillary 75  70 - 99 (mg/dL)    Comment 1 Notify RN     GLUCOSE, CAPILLARY     Status: Normal   Collection Time   09/09/11  7:21 AM  Component Value Range Comment   Glucose-Capillary 88  70 - 99 (mg/dL)    Comment 1 Notify RN       No results found.  Review of Systems  Unable to perform ROS: other   Blood pressure 144/83, pulse 69, temperature 98.3 F (36.8 C), temperature source Oral, resp. rate 20, height 6\' 1"  (1.854 m), weight 93.441 kg (206 lb), SpO2 95.00%. Physical Exam  Assessment/Plan: Chart reviewed, discussed with Dr. Laural Benes  Dr. Judie Bonus, Parents and wife Pt is withdraws but denies suicidal thoughts.  His wife provides pt hx re changes of medications.  He has had manic episodes, one most  recently has transformed into depressed mood and poor concentration.  He denies suicidal thoughts now.  He sees Dr. Judie Bonus and he and family do not want him  to go to inpatient psychiatric care. RECOMMENDATION: 1. Transfer to immediate outpatient psychiatric care with Dr. Judie Bonus who recommends low dose lithium and titration of Lamictal when medically stable. 2. No further psychiatric needs are identified.  MD Psychaitrist signs off.    [name] 09/09/2011, 3:34 PM

## 2011-09-09 NOTE — Progress Notes (Signed)
CHART REVIEWED; B Laurens Matheny RN, BSN, MHA 

## 2011-09-09 NOTE — Progress Notes (Signed)
Spoke with psych MD.  Pt is not in need of inpt tx, as psych MD spoke with Pt's outpt psychiatrist, Dr. Alveta Heimlich, and a plan has been made for Pt to f/u with him once d/c'd.  CSW to follow Pt and assist Pt with procuring an appt with Dr. Alveta Heimlich when he is to be d/c'd.  CSW to continue to follow.  Providence Crosby, LCSWA Clinical Social Work [phone]

## 2011-09-09 NOTE — Progress Notes (Signed)
Portable EEG completed

## 2011-09-09 NOTE — Progress Notes (Signed)
Met with Pt and his parents and spoke with Pt's wife in Pt's room via phone re: current admission.  Pt reports that he's had pxs with depression since he was 28.  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  Pt reports that he lives with his wife of 19 years.  Pt states that he went to [facility] at the beginning of March due to debilitating depression and that he was d/c'd too soon.  He then returned to [facility] and was over medicated, for which he was hospitalized at [facility].  Pt's parents report that Pt has been dx'd with Bipolar and that he's had 3 inpt hospitalizations for his diagnosis.  Pt's mom states that he does well on Lithium and Depakote but that his psychiatrist, kidney MD and PCP felt that he needed to be off of the Lithium due to his kidney (Pt had kidney cancer and had one kidney removed).  Pt's mom state that his doctors changed his medications last summer and that he was doing well until recently.  Per Pt's wife, Pt went into a manic episode shortly after changing medications, with spending excessively.  She stated that she thinks that Pt's depression was triggered when Pt came realized the amount that he spent and the things that he purchased.  Pt's wife stated that Pt doesn't want his parents to know this and asked that this not be shared.  Family very unhappy with the care that Pt received at [facility]  They feel that Pt was over medicated and they stated that the MDs wouldn't communicate with them, despite their repeated attempts to meet with staff.  Family interested in [facility] or [facility].  CSW left a message for the admissions person at [facility].  CSW left a message for the assessment department at [facility].  CSW to continue to follow.  Providence Crosby, LCSWA Clinical Social Work [phone]

## 2011-09-09 NOTE — Progress Notes (Signed)
Patient's family request that his doctor sign his short term disability forms which have been left in the patient's room.

## 2011-09-09 NOTE — Evaluation (Signed)
Physical Therapy Evaluation Patient Details Name: [name] MRN: [id number] DOB: 11/15/1950 Today's Date: 09/09/2011  Problem List:  Patient Active Problem List  Diagnoses  . MALIGNANT NEOPLASM OF KIDNEY EXCEPT PELVIS  . HYPERLIPIDEMIA  . BIPOLAR DISORDER UNSPECIFIED  . OBSTRUCTIVE SLEEP APNEA  . ASTHMA, CHILDHOOD  . METATARSALGIA  . DYSPNEA ON EXERTION  . Bipolar 1 disorder, mixed  . CKD (chronic kidney disease), stage III  . Bipolar I disorder, most recent episode (or current) depressed  . Constipation  . Altered mental status    Past Medical History:  Past Medical History  Diagnosis Date  . Renal cell carcinoma     chemo and xrt--lost rt kidney  . Childhood asthma   . Sinusitis   . Cellulitis     after left foot surgery  . Osteoarthritis   . OSA (obstructive sleep apnea)     NPSG 05-08-07 AHI 63.1/hr,desat t0 80%/loud snoring  . Bipolar 1 disorder   . Hyperlipemia   . Mental disorder    Past Surgical History:  Past Surgical History  Procedure Date  . Podiatric surgery     left foot toes,   . Bunionectomy   . Nephrectomy 1998    rt., d/t cancer  . Cholecystectomy   . Colon surgery     PT Assessment/Plan/Recommendation PT Assessment Clinical Impression Statement: 61 y.o. male admitted from [facility] (where he was being treated for depression) with lethary/unresponsiveness. Pt feels he has become weak from "lying around in bed a lot at behavioral health and in hospital".  Encouraged pt to ambulate with assist of family or nursing TID, also encouraged use of his straight cane for extra support.  PT Recommendation/Assessment: Patient will need skilled PT in the acute care venue PT Problem List: Decreased activity tolerance;Decreased knowledge of use of DME Barriers to Discharge: None PT Therapy Diagnosis : Generalized weakness PT Plan PT Frequency: Min 3X/week PT Treatment/Interventions: Gait training;DME instruction;Therapeutic  activities;Therapeutic exercise;Patient/family education PT Recommendation Follow Up Recommendations: No PT follow up Equipment Recommended: None recommended by PT PT Goals  Acute Rehab PT Goals PT Goal Formulation: With patient Time For Goal Achievement: 2 weeks Pt will Ambulate: >150 feet;with modified independence;with least restrictive assistive device PT Goal: Ambulate - Progress: Goal set today Pt will Go Up / Down Stairs: Flight;with rail(s);with modified independence PT Goal: Up/Down Stairs - Progress: Goal set today Pt will Perform Home Exercise Program: Independently PT Goal: Perform Home Exercise Program - Progress: Goal set today  PT Evaluation Precautions/Restrictions  Restrictions Weight Bearing Restrictions: No Prior Functioning  Home Living Lives With: Spouse Type of Home: House Home Layout: Two level Alternate Level Stairs-Number of Steps: 13 Home Access: Stairs to enter Entrance Stairs-Rails: None Entrance Stairs-Number of Steps: 3 Home Adaptive Equipment: Straight cane Prior Function Level of Independence: Independent with basic ADLs;Independent with homemaking with ambulation;Independent with gait;Independent with transfers Able to Take Stairs?: Yes Driving: Yes Cognition Cognition Arousal/Alertness: Awake/alert (flat affect) Overall Cognitive Status: Appears within functional limits for tasks assessed Orientation Level: Oriented X4 Sensation/Coordination Sensation Light Touch: Appears Intact Coordination Gross Motor Movements are Fluid and Coordinated: Yes Fine Motor Movements are Fluid and Coordinated: Yes (resting tremor in hands noted) Extremity Assessment RUE Assessment RUE Assessment: Within Functional Limits LUE Assessment LUE Assessment: Within Functional Limits RLE Assessment RLE Assessment: Within Functional Limits LLE Assessment LLE Assessment: Within Functional Limits Mobility (including Balance) Bed Mobility Bed Mobility:  Yes Supine to Sit: 7: Independent;HOB flat Sitting - Scoot to [place]  of Bed: 7: Independent Transfers Transfers: Yes Sit to Stand: 6: Modified independent (Device/Increase time);With upper extremity assist;From bed Stand to Sit: To chair/3-in-1;With armrests;With upper extremity assist;6: Modified independent (Device/Increase time) Ambulation/Gait Ambulation/Gait: Yes Ambulation/Gait Assistance: 5: Supervision Ambulation/Gait Assistance Details (indicate cue type and reason): supervision due to pt report of feeling a little off balance, no LOB while walking holding on to IV pole with one hand Ambulation Distance (Feet): 150 Feet Assistive device: Other (Comment) (IV pole) Gait Pattern: Decreased step length - left;Decreased step length - right Stairs: No Wheelchair Mobility Wheelchair Mobility: No  Posture/Postural Control Posture/Postural Control: No significant limitations Balance Balance Assessed: No Exercise    End of Session PT - End of Session Equipment Utilized During Treatment: Gait belt Activity Tolerance: Patient tolerated treatment well Patient left: in chair;with call bell in reach Nurse Communication: Mobility status for transfers;Mobility status for ambulation General Behavior During Session: [facility] for tasks performed Cognition: [facility] for tasks performed  [name] 09/09/2011, 9:25 AM [phone]

## 2011-09-09 NOTE — Progress Notes (Signed)
Patient had a BM after Fleets enema completed.  Dr. Laural Benes notified and he is to come up and see the patient for DC within the next hour.

## 2011-09-09 NOTE — Progress Notes (Signed)
DC instructions reviewed with patient and spouse.  Patient denies further questions or concerns at this time. No changes noted since am assessment. IV dc.  Patient to call for f/u appt in 1-2 days.  Rxs given.  Patient DC to home with wife.

## 2011-09-09 NOTE — Procedures (Signed)
EEG NUMBER:  REFERRING PHYSICIAN:  Carlota Raspberry, MD  HISTORY:  A 61 year old male with episode of decreased responsiveness.  MEDICATIONS:  Norvasc, Colace, Chronulac, and Senokot.  CONDITIONS OF RECORDING:  This is a 16-channel EEG carried out with the patient in the awake, drowsy, and asleep states.  DESCRIPTION:  The waking background activity consists of a low-voltage, symmetrical, fairly well-organized, 9 Hz alpha activity seen from the parieto-occipital and posterotemporal regions.  Low-voltage, fast activity, poorly organized was seen anteriorly at times, superimposed on more posterior rhythms.  A mixture of theta and alpha rhythm was seen from the central and temporal regions.  The patient drowses with slowing to irregular which is theta and beta activity.  The patient goes into a light sleep briefly with symmetrical sleep spindles, the vertex was a sharp activity and irregular slow activity.  Hypoventilation was not performed.  Intermittent photic stimulation failed to elicit any abnormalities.  IMPRESSION:  This is a normal EEG.          ______________________________ Thana Farr, MD    RU:EAVW D:  09/09/2011 18:01:32  T:  09/09/2011 22:25:52  Job #:  [id number]

## 2011-09-09 NOTE — Progress Notes (Signed)
I spoke with patient's private psychiatrist Dr. Jasmine Awe who knows the patient very well and is very familiar with the current situation.  He spoke with the patient's wife and is well aware of the situation.  He reports that he is recommending that the patient either go to the [facility] facility for care or be discharged home to followup with him in the next day.  He is recommending that the patient restart lithium 300 mg each bedtime and further management he will address outpatient.  The patient is doing better not having any suicidal thoughts and eating well and his behavior has been very stable during the last several days.  I'm going to discharge him home with his family and he will followup with his personal psychiatrist in the next 1- 2 days.  I was impressed that Dr. Jasmine Awe know pt well, was very knowlegeable about the current situation and agreed to see the patient very quickly after discharge.  Pt has been having significant constipation and will provide him with an enema before going home to be sure it is relieved and provide stool softeners.    Cleora Fleet, MD, CDE, FAAFP [facility] [place], [place]  [phone]

## 2011-09-09 NOTE — Discharge Summary (Signed)
HOSPITAL DISCHARGE SUMMARY   @n   [name], 61 y.o., DOB 17-Sep-1950, MRN [id number]  Admission date: 09/07/2011 Discharge Date: 09/09/2011  Primary MD Lucilla Edin, MD, MD  Admitting Physician Devonne Doughty, MD  Admission Diagnosis  Bipolar I disorder, most recent episode  Discharge Diagnoses:   No resolved problems to display.  Active Hospital Problems  Diagnoses Date Noted   . Altered mental status 09/06/2011   . Bipolar 1 disorder, mixed 08/23/2011   . CKD (chronic kidney disease), stage III 08/23/2011        Constipation  Resolved Hospital Problems  Diagnoses Date Noted Date Resolved    Past Medical History  Diagnosis Date  . Renal cell carcinoma     chemo and xrt--lost rt kidney  . Childhood asthma   . Sinusitis   . Cellulitis     after left foot surgery  . Osteoarthritis   . OSA (obstructive sleep apnea)     NPSG 05-08-07 AHI 63.1/hr,desat t0 80%/loud snoring  . Bipolar 1 disorder   . Hyperlipemia   . Mental disorder     Past Surgical History  Procedure Date  . Podiatric surgery     left foot toes,   . Bunionectomy   . Nephrectomy 1998    rt., d/t cancer  . Cholecystectomy   . Colon surgery     Consults  Psychiatry Services  Significant Tests:  See full reports for all details     Hospital Course See H&P, Labs, Consult and Test reports for all details. In brief, this patient was admitted for AMS from the [facility] where had been for 2 weeks after being admitted for depression and bipolar disorder.  He had recently been started on some new medications including zyprexa, valproic acid, propanolol and others and had began to become drowsy and have some mental status changes.  We admitted him and took him off all of his medications and he responded very well.  He was seen by neurologist and it was thought that his condition was medication induced.  The family decided against having an MRI.  He improved rapidly.  He began to return to his  baseline.  He was seen by psychiatry consulting services.  They gave him some options of going for inpatient treatment at [facility] but family did not want him to go back there.  I spoke with his private psychiatrist who said he would see him outpatient if he was discharged. The family was in agreeement with that and I spoke with psychiatry services and they said they felt it was safe for him to go home.  His behavior has been very stable.  Dr. Jasmine Awe told me to send him home on lithium 300 mg qhs and he will see him in 1-2 days for followup and further management.  Pt was treated for severe constipation. He will be sent home on laxatives.   No resolved problems to display.  Active Hospital Problems  Diagnoses Date Noted   . Altered mental status 09/06/2011   . Bipolar 1 disorder, mixed 08/23/2011   . CKD (chronic kidney disease), stage III 08/23/2011     Resolved Hospital Problems  Diagnoses Date Noted Date Resolved     Today's Assessment:   Subjective:   [name]  Is eating well. He had a large BM.  He wants to go home.   Objective:   Blood pressure 144/83, pulse 69, temperature 98.3 F (36.8 C), temperature source Oral, resp. rate 20, height  6\' 1"  (1.854 m), weight 93.441 kg (206 lb), SpO2 95.00%.  Intake/Output Summary (Last 24 hours) at 09/09/11 1824 Last data filed at 09/09/11 1500  Gross per 24 hour  Intake    960 ml  Output   1800 ml  Net   -840 ml    Exam  General - awake, alert, nad Lungs BBS clear CV - normal s1, s2 sounds Abd - soft nondistended nontender Ext - no CCE   Lab Results  Component Value Date   WBC 7.0 09/07/2011   WBC 3.6* 08/31/2011   HGB 15.6 09/07/2011   HGB 16.1 08/31/2011   HCT 45.4 09/07/2011   HCT 49.4 08/31/2011   PLT 117* 09/07/2011   LYMPHOPCT 11* 09/06/2011   MONOPCT 11 09/06/2011   EOSPCT 1 09/06/2011   BASOPCT 1 09/06/2011   CMP: Lab Results  Component Value Date   NA 135 09/07/2011   K 4.5 09/07/2011   CL 101 09/07/2011   CO2 29  09/07/2011   BUN 27* 09/07/2011   CREATININE 1.98* 09/07/2011   CREATININE 2.14* 08/31/2011   PROT 6.6 09/07/2011   ALBUMIN 3.2* 09/07/2011   BILITOT 0.6 09/07/2011   ALKPHOS 56 09/07/2011   AST 26 09/07/2011   ALT 27 09/07/2011  .  DISCHARGE MEDICATION: Medication List  As of 09/09/2011  6:24 PM   STOP taking these medications         mirtazapine 30 MG tablet      OLANZapine zydis 5 MG disintegrating tablet      ondansetron 4 MG disintegrating tablet      polyethylene glycol packet      propranolol 20 MG tablet      sertraline 20 MG/ML concentrated solution      Valproic Acid 250 MG/5ML Syrp syrup         TAKE these medications         amLODipine 10 MG tablet   Commonly known as: NORVASC   Take 1 tablet (10 mg total) by mouth daily.      DSS 100 MG Caps   Take 100 mg by mouth 2 (two) times daily.      feeding supplement Liqd   Take 237 mLs by mouth 3 (three) times daily with meals.      lactulose 10 GM/15ML solution   Commonly known as: CHRONULAC   Take 30 mLs (20 g total) by mouth 2 (two) times daily as needed (constipation).      lithium carbonate 300 MG CR tablet   Commonly known as: LITHOBID   Take 1 tablet (300 mg total) by mouth every evening.      multivitamin tablet   Take 1 tablet by mouth daily. For nutritional supplementation.            Disposition and Follow-up:  Discharge Orders    Future Appointments: Provider: Department: Dept Phone: Center:   09/17/2011 12:00 PM Collene Gobble, MD [facility] [phone] [facility]   11/12/2011 8:00 AM Collene Gobble, MD [facility] [phone] [facility]   04/10/2012 3:45 PM Waymon Budge, MD [facility] [phone] None     Future Orders Please Complete By Expires   Diet - low sodium heart healthy      Scheduling Instructions:   High fiber diet recommended   Increase activity slowly      Discharge instructions      Comments:   Please call and make follow up with Dr. Jasmine Awe in 1-2 days  for  hospital follow up.     Follow-up Information    Follow up with PLOVSKY, [place]annie [place], MD in 2 days.   Contact information:   [address] [employer] [address] [place] [address] [phone]           The risks, benefits, and possible side effects of all treatments and tests were explained to the patient.  The patient verbalized understanding.  The importance of close follow up with the primary care medical provider was explained clearly to the patient.  The patient verbalized understanding.  The patient was given instructions to return if symptoms recur, worsen or new changes develop.  The patient verbalized understanding.   Cleora Fleet, MD, CDE, FAAFP [facility] [place], [place]   Total Time spent reviewing critical document, reviewing this patient's comprehensive hospitalization, arranging follow up and coordination of care, reviewing data and todays exam greater than 35 minutes.   SignedStandley Dakins 09/09/2011 6:24 PM

## 2011-09-17 ENCOUNTER — Ambulatory Visit (INDEPENDENT_AMBULATORY_CARE_PROVIDER_SITE_OTHER): Payer: 59 | Admitting: Emergency Medicine

## 2011-09-17 ENCOUNTER — Encounter: Payer: Self-pay | Admitting: Emergency Medicine

## 2011-09-17 VITALS — BP 138/77 | HR 56 | Temp 97.2°F | Resp 16 | Ht 73.0 in | Wt 192.0 lb

## 2011-09-17 DIAGNOSIS — F339 Major depressive disorder, recurrent, unspecified: Secondary | ICD-10-CM

## 2011-09-17 DIAGNOSIS — N189 Chronic kidney disease, unspecified: Secondary | ICD-10-CM

## 2011-09-17 DIAGNOSIS — F319 Bipolar disorder, unspecified: Secondary | ICD-10-CM

## 2011-09-17 MED ORDER — LACTULOSE 10 GM/15ML PO SOLN: 20.0000 g | Freq: Two times a day (BID) | ORAL | Status: DC | PRN

## 2011-09-17 NOTE — Progress Notes (Signed)
  Subjective:    Patient ID: [name], male    DOB: August 13, 1950, 61 y.o.   MRN: [id number]  HPI patient is for followup after recent hospitalization with bipolar disease worsening depression. 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 He had been on lithium in the past however due to declining renal function his medications were changed. After that change patient has significant loss of functioning. He was reinstituted on lithium and continues on Lamictal.    Review of Systems he overall is functioning better his major complaint today is of tremor. He also feels he may be slightly weaker in the left arm. CT of the head did show some mild atrophy but no acute changes and no evidence of a stroke     Objective:   Physical Exam exam reveals a much  More responsive gentleman in no acute distress. He has no focal neurological signs Involving the cranial nerves. He does have a tremor of the upper extremities which is worse with intention. Chest is clear his heart regular rate no murmurs        Assessment & Plan:  Assessment bipolar disease requiring lithium and Lamictal in a patient with significant renal disease because of renal surgery. We'll check levels of his thyroid Cmet and lithium and send these results to Dr. Donell Beers

## 2011-09-18 LAB — CBC WITH DIFFERENTIAL/PLATELET
Basophils Absolute: 0 10*3/uL (ref 0.0–0.1)
Basophils Relative: 1 % (ref 0–1)
HCT: 50.1 % (ref 39.0–52.0)
Lymphocytes Relative: 14 % (ref 12–46)
MCHC: 34.1 g/dL (ref 30.0–36.0)
Monocytes Absolute: 0.4 10*3/uL (ref 0.1–1.0)
Neutro Abs: 4.6 10*3/uL (ref 1.7–7.7)
Neutrophils Relative %: 78 % — ABNORMAL HIGH (ref 43–77)
Platelets: 130 10*3/uL — ABNORMAL LOW (ref 150–400)
RDW: 13.3 % (ref 11.5–15.5)
WBC: 5.9 10*3/uL (ref 4.0–10.5)

## 2011-09-18 LAB — COMPREHENSIVE METABOLIC PANEL
ALT: 27 U/L (ref 0–53)
AST: 24 U/L (ref 0–37)
Albumin: 4.6 g/dL (ref 3.5–5.2)
Alkaline Phosphatase: 66 U/L (ref 39–117)
BUN: 29 mg/dL — ABNORMAL HIGH (ref 6–23)
Potassium: 4.6 mEq/L (ref 3.5–5.3)

## 2011-09-25 ENCOUNTER — Telehealth: Payer: Self-pay

## 2011-09-25 MED ORDER — LACTULOSE 10 GM/15ML PO SOLN: 20.0000 g | Freq: Two times a day (BID) | ORAL | Status: AC | PRN

## 2011-09-25 MED ORDER — DSS 100 MG PO CAPS: 200.0000 mg | ORAL_CAPSULE | Freq: Every day | ORAL | Status: DC

## 2011-09-25 NOTE — Telephone Encounter (Signed)
Please call in refills as these were prescribed by the hospital. He can have refilled times 2

## 2011-09-25 NOTE — Telephone Encounter (Signed)
Pts wife Andra Matsuo states that pt needs a refill on lactulose and bocusate sodium 100mg  these medications were prescribed by the hospital. Pharmacy:[facility]  [address]

## 2011-09-25 NOTE — Telephone Encounter (Signed)
Dr. Cleta Alberts, can we rx these?

## 2011-09-25 NOTE — Telephone Encounter (Signed)
Rx's sent in and patient's wife notified.

## 2011-09-30 ENCOUNTER — Ambulatory Visit (INDEPENDENT_AMBULATORY_CARE_PROVIDER_SITE_OTHER): Payer: 59 | Admitting: Family Medicine

## 2011-09-30 VITALS — BP 122/64 | HR 64 | Temp 97.4°F | Resp 18 | Ht 75.0 in | Wt 192.6 lb

## 2011-09-30 DIAGNOSIS — R5383 Other fatigue: Secondary | ICD-10-CM

## 2011-09-30 DIAGNOSIS — R5381 Other malaise: Secondary | ICD-10-CM

## 2011-09-30 DIAGNOSIS — R51 Headache: Secondary | ICD-10-CM

## 2011-09-30 LAB — BASIC METABOLIC PANEL
Calcium: 9.8 mg/dL (ref 8.4–10.5)
Creat: 2.09 mg/dL — ABNORMAL HIGH (ref 0.50–1.35)

## 2011-09-30 LAB — POCT CBC
Lymph, poc: 1.1 (ref 0.6–3.4)
MCH, POC: 31.6 pg — AB (ref 27–31.2)
MCHC: 33.5 g/dL (ref 31.8–35.4)
MID (cbc): 0.4 (ref 0–0.9)
MPV: 7.1 fL (ref 0–99.8)
POC Granulocyte: 3.4 (ref 2–6.9)
POC MID %: 8.9 %M (ref 0–12)
Platelet Count, POC: 151 10*3/uL (ref 142–424)
WBC: 5 10*3/uL (ref 4.6–10.2)

## 2011-09-30 LAB — LITHIUM LEVEL: Lithium Lvl: 0.44 mEq/L — ABNORMAL LOW (ref 0.80–1.40)

## 2011-09-30 NOTE — Discharge Summary (Signed)
Physician Discharge Summary Note  Patient:  [name] is an 61 y.o., male MRN:  [id number] DOB:  February 22, 1951 Patient phone:  [phone] (home)  Patient address:   [address] [place] [address],   Date of Admission:  09/01/2011 Date of Discharge: 09/06/2011  Diagnosis:   AXIS I:  Bipolar Disorder, Type I, Depressed AXIS II:  deferred AXIS III:  Hyperlipidemia, Constipation, Tremor, Obstructive Sleep Apnea on CPAP, History of Malignant Neoplasm of kidney; Chronic Kidney Disease Stage III.  Past Medical History  Diagnosis Date  . Renal cell carcinoma     chemo and xrt--lost rt kidney  . Childhood asthma   . Sinusitis   . Cellulitis     after left foot surgery  . Osteoarthritis   . OSA on CPAP     NPSG 05-08-07 AHI 63.1/hr,desat t0 80%/loud snoring  . Bipolar 1 disorder   . Hyperlipemia   . Mental disorder    AXIS IV:  Occupational Issues AXIS V:  40  Level of Care:  Acute Inpatient Care  Hospital Course:    Second admission for [facility] who was referred by his primary care physician for an exacerbation of the depressive phase of his bipolar disorder. He been on our unit previously and was having difficulty in her being fully functional at home, being very depressed. On admission he rated his depression a 10 out of 10 on a 1-10 scale with 10 being the worst symptoms, hopelessness 10 out of 10, anxiety 10 out of 10. He complained of having decreased appetite, and difficulty initiating and maintaining sleep.  He was admitted for mood disorders program and we continued his CPAP at night for sleep apnea. Valproic acid level was found to be 74 on current dose. He was started on Zoloft to address depressive symptoms and his Remeron was continued at night. Depakote was continued at 500 mg twice a day.  Phone conference were held with his primary care physician and his primary psychiatrist, Archer Asa MD.   [name] complained of problems with constipation and internal  medicine consult was called to evaluate his abdominal discomfort and chronic constipation. Dr. Thana Farr, neurologist, was consulted for his chronic tremor which was worse with intention. Physical therapy was consulted for recommendations regarding safety in transfers and ambulation.  On the day of discharge, the patient was seen earlier walking in the hall with Physical Therapy and observed interacting with his family who came for lunch. At approximately 3:15 pm, this Physician entered the patient's room to further evaluate his condition and the patient was observed in bed with significant difficulty speaking. He also displayed moderate cogwheel rigidity in his muscles and had a slowed eye response and difficulty with eye movement.   The patient could acknowledge by nodding his head and could follow simple commands such as blinking. Considering the rapid change in the patient's condition it was decided to transfer the patient to the ED for evaluate of any acute event which may explain his change.   Consults:  Neurology Consult for Tremor; Internal Medicine Consult for Constipation and Abdominal Discomfort; Physical Therapy Consult for Gait/Safety Evaluation  Discharge Vitals:   Blood pressure 159/88, pulse 62, temperature 98.7 F (37.1 C), temperature source Oral, resp. rate 18, SpO2 96.00%.  Mental Status Exam: See Mental Status Examination and Suicide Risk Assessment completed by Attending Physician prior to discharge.  Discharge destination:  [facility] ED for evaluation of acute symptoms  Is patient on multiple antipsychotic therapies at  discharge:  No   Has Patient had three or more failed trials of antipsychotic monotherapy by history:  No  Recommended Plan for Multiple Antipsychotic Therapies: N/A  Discharge Orders    Future Appointments: Provider: Department: Dept Phone: Center:   11/12/2011 8:00 AM Collene Gobble, MD [facility] [place] [facility] [phone] [facility]   04/10/2012 3:45 PM  Waymon Budge, MD [facility] [phone] None     Medication List  As of 09/30/2011 10:51 AM   ASK your doctor about these medications      Indication    amLODipine 5 MG tablet   Commonly known as: NORVASC   Take 5 mg by mouth daily.   For Hypertension.    feeding supplement Liqd   Take 237 mLs by mouth 3 (three) times daily with meals.   For Nutritional Supplementation.    lithium carbonate 300 MG CR tablet   Commonly known as: LITHOBID   Take 300 mg by mouth every evening.   For Mood Stabilization.    mirtazapine 30 MG tablet   Commonly known as: REMERON   Take by mouth ONE FOURTH tablet at bed time For insomnia.   For Sleep, Depression and to stimulate appetite.    multivitamin tablet   Take 1 tablet by mouth daily. For nutritional supplementation.   For Nutritional Supplement.    OLANZapine zydis 5 MG disintegrating tablet   Commonly known as: ZYPREXA   Take 5 mg by mouth at bedtime.   For Psychosis.    ondansetron 4 MG disintegrating tablet   Commonly known as: ZOFRAN-ODT   Take 4 mg by mouth every 8 (eight) hours as needed. Nausea   For nausea.    polyethylene glycol packet   Commonly known as: MIRALAX / GLYCOLAX   Take 17 g by mouth daily.   For constipation.    propranolol 20 MG tablet   Commonly known as: INDERAL   Take 20 mg by mouth 2 (two) times daily.   For blood pressure.    sertraline 20 MG/ML concentrated solution   Commonly known as: ZOLOFT   Take 100 mg by mouth daily.   For depression.    Valproic Acid 250 MG/5ML Syrp syrup   Commonly known as: DEPAKENE   Take 250 mg by mouth 2 (two) times daily.   For mood stabilization.          Follow-up Information    Follow up with [facility] - Dr. Donell Beers on 09/06/2011. (Appointment scheduled at 10:00 AM)    Contact information:   [address] [place], [place] [address] [phone]      Follow up with Lucilla Edin, MD .         Signed: Kari Baars A 09/30/2011, 10:51  AM

## 2011-09-30 NOTE — Progress Notes (Signed)
Patient Name: [name] Date of Birth: 09-20-1950 Medical Record Number: [id number] Gender: male Date of Encounter: 09/30/2011  History of Present Illness:  [name] is a 61 y.o. very pleasant male patient who presents with the following:  Not feeling well for about a week- of note he recently was admitted to behaviorial health and started back on lithium and continues on lamictal.  He is here today with his wife. He had been taken off of lithium due to worries about potential kidney harm but he did not do well with his changed regimen.   No antipyretics today.    Per note from Dr. Cleta Alberts 09/17/11 HPI patient is for followup after recent hospitalization with bipolar disease worsening depression. 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 He had been on lithium in the past however due to declining renal function his medications were changed. After that change patient has significant loss of functioning. He was reinstituted on lithium and continues on Lamictal.  Roran feels that his overall mood is getting better and is stable currently.  He notes mainly chills and a feeling of malaise.  He has also noted a HA but states that this is just persistent but not severe.  He has tried tylenol for his HA but none in a couple of days.  He is also concerned that his feet may be swelling No other specific symptoms such as ST, cough, stomach pain, nausea, vomiting, diarrhea or rash.  He has not had a measured fever.  He has not been doing much outside lately and has not noted any tick bites.    He has had a nephrectomy due to renal cell carcinoma in the past.     Patient Active Problem List  Diagnoses  . MALIGNANT NEOPLASM OF KIDNEY EXCEPT PELVIS  . HYPERLIPIDEMIA  . BIPOLAR DISORDER UNSPECIFIED  . OBSTRUCTIVE SLEEP APNEA  . ASTHMA, CHILDHOOD  . METATARSALGIA  . DYSPNEA ON EXERTION  . Bipolar 1 disorder, mixed  . CKD (chronic kidney disease), stage III  . Bipolar I disorder,  most recent episode (or current) depressed  . Constipation  . Altered mental status   Past Medical History  Diagnosis Date  . Renal cell carcinoma     chemo and xrt--lost rt kidney  . Childhood asthma   . Sinusitis   . Cellulitis     after left foot surgery  . Osteoarthritis   . OSA on CPAP     NPSG 05-08-07 AHI 63.1/hr,desat t0 80%/loud snoring  . Bipolar 1 disorder   . Hyperlipemia   . Mental disorder    Past Surgical History  Procedure Date  . Podiatric surgery     left foot toes,   . Bunionectomy   . Nephrectomy 1998    rt., d/t cancer  . Cholecystectomy   . Colon surgery    History  Substance Use Topics  . Smoking status: Never Smoker   . Smokeless tobacco: Never Used  . Alcohol Use: Yes     6 beers a uear   Family History  Problem Relation Age of Onset  . Hypertension    . Sleep apnea    . Emphysema    . Prostate cancer     No Known Allergies  Medication list has been reviewed and updated.  Review of Systems: As per HPI- otherwise negative. No fever, no nausea or vomiting/ diarrhea but does have some constipation.  No cough, ST, etc  Physical Examination: Filed Vitals:  09/30/11 1531  BP: 122/64  Pulse: 64  Temp: 97.4 F (36.3 C)  TempSrc: Oral  Resp: 18  Height: 6\' 3"  (1.905 m)  Weight: 192 lb 9.6 oz (87.363 kg)    Body mass index is 24.07 kg/(m^2).  GEN: WDWN, NAD, Non-toxic, A & O x 3, looks fatigued HEENT: Atraumatic, Normocephalic. Neck supple. No masses, No LAD.  TM, oropharynx wnl.  Nasal cavity normal. Neck checked carefully for stiffness- negative Ears and Nose: No external deformity. CV: RRR, No M/G/R. No JVD. No thrill. No extra heart sounds. PULM: CTA B, no wheezes, crackles, rhonchi. No retractions. No resp. distress. No accessory muscle use. ABD: S, NT, ND, +BS. No rebound. No HSM.  Large scar from nephrectomy EXTR: No c/c/e NEURO Normal gait.   Significant tremor which is not new PSYCH: Normally interactive, slightly  flat.  Calm demeanor.  No rash on skin  Results for orders placed in visit on 09/30/11  POCT CBC      Component Value Range   WBC 5.0  4.6 - 10.2 (K/uL)   Lymph, poc 1.1  0.6 - 3.4    POC LYMPH PERCENT 22.3  10 - 50 (%L)   MID (cbc) 0.4  0 - 0.9    POC MID % 8.9  0 - 12 (%M)   POC Granulocyte 3.4  2 - 6.9    Granulocyte percent 68.8  37 - 80 (%G)   RBC 4.90  4.69 - 6.13 (M/uL)   Hemoglobin 15.5  14.1 - 18.1 (g/dL)   HCT, POC 40.9  81.1 - 53.7 (%)   MCV 94.3  80 - 97 (fL)   MCH, POC 31.6 (*) 27 - 31.2 (pg)   MCHC 33.5  31.8 - 35.4 (g/dL)   RDW, POC 91.4     Platelet Count, POC 151  142 - 424 (K/uL)   MPV 7.1  0 - 99.8 (fL)  BASIC METABOLIC PANEL      Component Value Range   Sodium 135  135 - 145 (mEq/L)   Potassium 4.7  3.5 - 5.3 (mEq/L)   Chloride 101  96 - 112 (mEq/L)   CO2 28  19 - 32 (mEq/L)   Glucose, Bld 97  70 - 99 (mg/dL)   BUN 27 (*) 6 - 23 (mg/dL)   Creat 7.82 (*) 9.56 - 1.35 (mg/dL)   Calcium 9.8  8.4 - 21.3 (mg/dL)  LITHIUM LEVEL      Component Value Range   Lithium Lvl 0.44 (*) 0.80 - 1.40 (mEq/L)    -------------------------------------------------------------------------------------------------------------------------------------- Office Visit on 09/17/2011  Component Date Value Range Status  . WBC (K/uL) 09/17/2011 5.9  4.0-10.5 Final  . RBC (MIL/uL) 09/17/2011 5.31  4.22-5.81 Final  . Hemoglobin (g/dL) 08/65/7846 96.2* 95.2-84.1 Final  . HCT (%) 09/17/2011 50.1  39.0-52.0 Final  . MCV (fL) 09/17/2011 94.4  78.0-100.0 Final  . MCH (pg) 09/17/2011 32.2  26.0-34.0 Final  . MCHC (g/dL) 32/44/0102 72.5  36.6-44.0 Final  . RDW (%) 09/17/2011 13.3  11.5-15.5 Final  . Platelets (K/uL) 09/17/2011 130* 150-400 Final  . Neutrophils Relative (%) 09/17/2011 78* 43-77 Final  . Neutro Abs (K/uL) 09/17/2011 4.6  1.7-7.7 Final  . Lymphocytes Relative (%) 09/17/2011 14  12-46 Final  . Lymphs Abs (K/uL) 09/17/2011 0.8  0.7-4.0 Final  . Monocytes Relative (%)  09/17/2011 7  3-12 Final  . Monocytes Absolute (K/uL) 09/17/2011 0.4  0.1-1.0 Final  . Eosinophils Relative (%) 09/17/2011 1  0-5 Final  . Eosinophils  Absolute (K/uL) 09/17/2011 0.1  0.0-0.7 Final  . Basophils Relative (%) 09/17/2011 1  0-1 Final  . Basophils Absolute (K/uL) 09/17/2011 0.0  0.0-0.1 Final  . Smear Review  09/17/2011 Criteria for review not met   Final  . Sodium (mEq/L) 09/17/2011 136  135-145 Final  . Potassium (mEq/L) 09/17/2011 4.6  3.5-5.3 Final  . Chloride (mEq/L) 09/17/2011 102  96-112 Final  . CO2 (mEq/L) 09/17/2011 23  19-32 Final  . Glucose, Bld (mg/dL) 16/03/9603 74  54-09 Final  . BUN (mg/dL) 81/19/1478 29* 2-95 Final  . Creat (mg/dL) 62/13/0865 7.84* 6.96-2.95 Final  . Total Bilirubin (mg/dL) 28/41/3244 0.9  0.1-0.2 Final  . Alkaline Phosphatase (U/L) 09/17/2011 66  39-117 Final  . AST (U/L) 09/17/2011 24  0-37 Final  . ALT (U/L) 09/17/2011 27  0-53 Final  . Total Protein (g/dL) 72/53/6644 7.3  0.3-4.7 Final  . Albumin (g/dL) 42/59/5638 4.6  7.5-6.4 Final  . Calcium (mg/dL) 33/29/5188 41.6  6.0-63.0 Final  . Lithium Lvl (mEq/L) 09/17/2011 0.38* 0.80-1.40 Final  . TSH (uIU/mL) 09/17/2011 1.637  0.350-4.500 Final    Assessment and Plan: 1. Malaise  POCT CBC, Basic metabolic panel, Lithium level   Malaise and fatigue for about a week without any other focal symptoms or findings at this time.  Will call patient tomorrow and check on how he is doing.  Always need to consider [facility] in [place], but as he has no rash and no fever he wishes to avoid adding any other medications such as abx unless absolutely necessary, and likelihood is very low.  If he gets worse or has any change in his symptoms in the meantime he is to call or proceed to ED if needed.   Asked them to monitor his temperature with a thermometer as a true fever may be significant.    addnd 4/16 Launa Flight, RN called patient today to give labs and check status- he is much the same.  I called tonight and  LMOM- will check on him again tomorrow.

## 2011-10-02 ENCOUNTER — Telehealth: Payer: Self-pay | Admitting: Family Medicine

## 2011-10-02 NOTE — Telephone Encounter (Signed)
Called to check on [name] today and spoke with his wife

## 2011-10-06 ENCOUNTER — Telehealth: Payer: Self-pay

## 2011-10-06 MED ORDER — AMLODIPINE BESYLATE 10 MG PO TABS: 10.0000 mg | ORAL_TABLET | Freq: Every day | ORAL | Status: DC

## 2011-10-06 NOTE — Telephone Encounter (Signed)
Spoke with pt advised RX sent to pharmacy. 

## 2011-10-06 NOTE — Telephone Encounter (Signed)
Please advise patient that the rx for Amlodipine was authorized and sent to his pharmacy.

## 2011-10-06 NOTE — Telephone Encounter (Signed)
Patient of Dr Cleta Alberts. Wife states pharmacy won't refill BP Rx and he only has 2 pills left of his amlodipine. Can he get refill? Uses [facility] on [address]. CB [phone]

## 2011-10-26 ENCOUNTER — Ambulatory Visit (INDEPENDENT_AMBULATORY_CARE_PROVIDER_SITE_OTHER): Payer: 59 | Admitting: Emergency Medicine

## 2011-10-26 ENCOUNTER — Telehealth: Payer: Self-pay

## 2011-10-26 VITALS — BP 131/75 | HR 58 | Temp 97.9°F | Resp 16 | Wt 189.0 lb

## 2011-10-26 DIAGNOSIS — R251 Tremor, unspecified: Secondary | ICD-10-CM

## 2011-10-26 DIAGNOSIS — R5381 Other malaise: Secondary | ICD-10-CM

## 2011-10-26 DIAGNOSIS — E291 Testicular hypofunction: Secondary | ICD-10-CM

## 2011-10-26 DIAGNOSIS — F319 Bipolar disorder, unspecified: Secondary | ICD-10-CM

## 2011-10-26 DIAGNOSIS — R5383 Other fatigue: Secondary | ICD-10-CM

## 2011-10-26 LAB — POCT CBC
Lymph, poc: 1.2 (ref 0.6–3.4)
MCHC: 33 g/dL (ref 31.8–35.4)
MPV: 6.7 fL (ref 0–99.8)
POC Granulocyte: 4.2 (ref 2–6.9)
POC LYMPH PERCENT: 21 %L (ref 10–50)
POC MID %: 6 %M (ref 0–12)
Platelet Count, POC: 202 10*3/uL (ref 142–424)
RDW, POC: 13.3 %

## 2011-10-26 MED ORDER — LACTULOSE 10 GM/15ML PO SOLN: 20.0000 g | Freq: Three times a day (TID) | ORAL | Status: DC

## 2011-10-26 MED ORDER — LACTULOSE 10 GM/15ML PO SOLN: 20.0000 g | Freq: Three times a day (TID) | ORAL | Status: AC

## 2011-10-26 NOTE — Telephone Encounter (Signed)
Patient called he is coming in to see me

## 2011-10-26 NOTE — Progress Notes (Signed)
  Subjective:    Patient ID: [name], male    DOB: 07/05/1950, 61 y.o.   MRN: [id number]  HPI patient here to followup his bipolar disease. He has done much better since getting back on lithium. He is also currently on Lamictal. He sees Dr. Windle Guard he regular. His main complaint has been of severe fatigue. He was able to go back to work 2 weeks ago. He has not any chest pain or shortness of breath. He states whenever he tries to do anything he just has to stop because he is exhausted. When he finishes a day at work he does goes home to bed. He has had significant worsening of his tremor. He does have a lot of difficulty playing his guitar due to the tremor he has difficulty walking mainly from fatigue but also feels somewhat unstable to    Review of Systems he sees Dr. Kary Kos regular for his bipolar disease. He has a history of hypogonadism he has been off his testosterone since his medications for bipolar disease have been adjusted. He also feels like he is impacted. He's been on lactulose before for constipation to     Objective:   Physical Exam patient is a little more communicative than the last time I saw him. He has a significant intention tremor of both upper extremities but worse on the right. His neck is supple his chest is clear his heart is regular rate without murmur. He was unable to do tandem alcohol. He is somewhat ataxic coming up all. I did finger nose testing and he has extreme dysmetria. Rectal exam was performed there is soft stool in the vault no firm stool .        Assessment & Plan:  Mentally the patient seems to be doing better with his bipolar disease. He has extreme fatigue which may be secondary to him stopping his testosterone injections. He has a significant worsening of his tremor. It is unclear to me whether this is medication related or due to some type of injury to the cerebellum he did have a CT scan done in March which showed cortical atrophy but otherwise no  abnormalities. He was adamant about not seeing a neurologist. He does not want to see any of the physicians. At the end of the evaluation he he said he was just afraid to see any more doctors. A 1 and check all his labs and refilled his lactulose.

## 2011-10-26 NOTE — Telephone Encounter (Signed)
Dr Cleta Alberts   Please call [place] @ home.

## 2011-10-27 LAB — COMPREHENSIVE METABOLIC PANEL
ALT: 28 U/L (ref 0–53)
Alkaline Phosphatase: 76 U/L (ref 39–117)
Sodium: 138 mEq/L (ref 135–145)
Total Bilirubin: 0.7 mg/dL (ref 0.3–1.2)
Total Protein: 7.2 g/dL (ref 6.0–8.3)

## 2011-10-27 LAB — LITHIUM LEVEL: Lithium Lvl: 0.44 mEq/L — ABNORMAL LOW (ref 0.80–1.40)

## 2011-10-28 ENCOUNTER — Telehealth: Payer: Self-pay

## 2011-10-28 LAB — TESTOSTERONE, FREE, TOTAL, SHBG
Sex Hormone Binding: 53 nmol/L (ref 13–71)
Testosterone, Free: 18.1 pg/mL — ABNORMAL LOW (ref 47.0–244.0)

## 2011-10-28 NOTE — Telephone Encounter (Signed)
Patient was told by Dr. Cleta Alberts Saturday that he would get lab results today.

## 2011-10-29 NOTE — Telephone Encounter (Signed)
PT WAS CALLED THIS MORNING

## 2011-10-30 ENCOUNTER — Ambulatory Visit (INDEPENDENT_AMBULATORY_CARE_PROVIDER_SITE_OTHER): Payer: 59 | Admitting: Emergency Medicine

## 2011-10-30 ENCOUNTER — Ambulatory Visit: Payer: 59

## 2011-10-30 VITALS — BP 146/101 | HR 69 | Temp 98.0°F | Resp 20 | Ht 76.0 in | Wt 189.0 lb

## 2011-10-30 DIAGNOSIS — M542 Cervicalgia: Secondary | ICD-10-CM

## 2011-10-30 DIAGNOSIS — E291 Testicular hypofunction: Secondary | ICD-10-CM

## 2011-10-30 MED ORDER — TESTOSTERONE ENANTHATE 200 MG/ML IM SOLN
200.0000 mg | Freq: Once | INTRAMUSCULAR | Status: AC
  Administered 2011-10-30: 200 mg via INTRAMUSCULAR

## 2011-10-30 NOTE — Progress Notes (Signed)
  Subjective:    Patient ID: [name], male    DOB: March 11, 1951, 61 y.o.   MRN: [id number]  HPI patient was seen here recently with  severe fatigue. He had been on testosterone shots but they were stopped when he went to the hospital. His testosterone level was incredibly low and he is brought in today to restart his testosterone injection. He's having a popping sensation in his neck with pain on twisting his neck.    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

## 2011-10-30 NOTE — Patient Instructions (Signed)
Cervical Sprain  A cervical sprain is when the ligaments in the neck stretch or tear. The ligaments are the tissues that hold the neck bones in place.  HOME CARE    Put ice on the injured area.   Put ice in a plastic bag.   Place a towel between your skin and the bag.   Leave the ice on for 15 to 20 minutes, 3 to 4 times a day.   Only take medicine as told by your doctor.   Keep all doctor visits as told.   Keep all physical therapy visits as told.   If your doctor gives you a neck collar, wear it as told.   Do not drive while wearing a neck collar.   Adjust your work station so that you have good posture while you work.   Avoid positions and activities that make your problems worse.   Warm up and stretch before being active.  GET HELP RIGHT AWAY IF:    You are bleeding or your stomach is upset.   You have an allergic reaction to your medicine.   Your problems (symptoms) get worse.   You develop new problems.   You lose feeling (numbness) or you cannot move (paralysis) any part of your body.   You have tingling or weakness in any part of your body.   Your pain is not controlled with medicine.   You cannot take less pain medicine over time as planned.   Your activity level does not improve as expected.  MAKE SURE YOU:    Understand these instructions.   Will watch your condition.   Will get help right away if you are not doing well or get worse.  Document Released: 11/20/2007 Document Revised: 05/23/2011 Document Reviewed: 03/07/2011  [employer] Patient Information 2012 [employer].

## 2011-11-12 ENCOUNTER — Encounter: Payer: Self-pay | Admitting: Emergency Medicine

## 2011-11-12 ENCOUNTER — Ambulatory Visit: Payer: 59

## 2011-11-12 ENCOUNTER — Ambulatory Visit (INDEPENDENT_AMBULATORY_CARE_PROVIDER_SITE_OTHER): Payer: 59 | Admitting: Emergency Medicine

## 2011-11-12 VITALS — BP 116/71 | HR 59 | Temp 98.0°F | Resp 20 | Ht 73.0 in | Wt 191.0 lb

## 2011-11-12 DIAGNOSIS — N058 Unspecified nephritic syndrome with other morphologic changes: Secondary | ICD-10-CM

## 2011-11-12 DIAGNOSIS — F319 Bipolar disorder, unspecified: Secondary | ICD-10-CM

## 2011-11-12 DIAGNOSIS — Z0000 Encounter for general adult medical examination without abnormal findings: Secondary | ICD-10-CM

## 2011-11-12 DIAGNOSIS — R5383 Other fatigue: Secondary | ICD-10-CM

## 2011-11-12 DIAGNOSIS — E236 Other disorders of pituitary gland: Secondary | ICD-10-CM

## 2011-11-12 DIAGNOSIS — R251 Tremor, unspecified: Secondary | ICD-10-CM

## 2011-11-12 DIAGNOSIS — C689 Malignant neoplasm of urinary organ, unspecified: Secondary | ICD-10-CM

## 2011-11-12 DIAGNOSIS — E291 Testicular hypofunction: Secondary | ICD-10-CM

## 2011-11-12 DIAGNOSIS — R634 Abnormal weight loss: Secondary | ICD-10-CM

## 2011-11-12 LAB — POCT URINALYSIS DIPSTICK
Glucose, UA: NEGATIVE
Ketones, UA: NEGATIVE
Spec Grav, UA: 1.015

## 2011-11-12 LAB — TSH: TSH: 2.831 u[IU]/mL (ref 0.350–4.500)

## 2011-11-12 LAB — POCT UA - MICROSCOPIC ONLY: Bacteria, U Microscopic: NEGATIVE

## 2011-11-12 LAB — COMPREHENSIVE METABOLIC PANEL
ALT: 16 U/L (ref 0–53)
Albumin: 4.8 g/dL (ref 3.5–5.2)
BUN: 19 mg/dL (ref 6–23)
CO2: 30 mEq/L (ref 19–32)
Calcium: 9.7 mg/dL (ref 8.4–10.5)
Chloride: 101 mEq/L (ref 96–112)
Creat: 2.21 mg/dL — ABNORMAL HIGH (ref 0.50–1.35)

## 2011-11-12 LAB — LIPID PANEL: Cholesterol: 242 mg/dL — ABNORMAL HIGH (ref 0–200)

## 2011-11-12 MED ORDER — TESTOSTERONE ENANTHATE 200 MG/ML IM SOLN
200.0000 mg | INTRAMUSCULAR | Status: DC
  Administered 2011-11-12 – 2012-02-07 (×3): 200 mg via INTRAMUSCULAR

## 2011-11-12 NOTE — Progress Notes (Signed)
Subjective:    Patient ID: [name], male    DOB: [birth year], 61 y.o.   MRN: [id number]  HPI patient enters for his general physical exam. He has had a very difficult last 6 months. When he had his upper her medications changed he went to an episode of catatonia. He eventually was placed back on lithium and is significantly better. He continues to complain of extreme weakness and fatigue he has had a worsening tremor. He states he is unable to exercise eating 1-2 minutes without having to stop and    Review of Systems  Constitutional: Positive for fatigue and unexpected weight change.  Eyes:       Patient states he has lost his peripheral vision he couldn't see straight ahead but cannot see peripheral areas of the screen he sees Dr. Charlotte Sanes and was advised to make an appointment as soon as possible to have this checked the  Respiratory:       He has shortness of breath that time  Cardiovascular: Positive for leg swelling.  Gastrointestinal:       He is bothered with chronic constipation. He has been treated with lactulose. He feels impacted.  Genitourinary:       He has a history of renal cell cancer. History hypogonadism on testosterone replacement  Musculoskeletal:       He is extremely weak and fatigued no energy. He is unable to do any form of exercise. When he gets home from work he is so fatigued go straight to bed.  Neurological:       His has significant problems with tremor. He has intermittent headache. He feels dizzy at times  Hematological: Negative.   Psychiatric/Behavioral:       Patient has bipolar disease currently under the treatment of Dr.Plovsky       Objective:   Physical Exam  Constitutional:       Patient appears somewhat wasted in no acute distress.  HENT:  Head: Normocephalic.  Right Ear: External ear normal.  Left Ear: External ear normal.  Eyes: Pupils are equal, round, and reactive to light.  Neck: No tracheal deviation present. No thyromegaly  present.  Cardiovascular: Normal rate, regular rhythm and normal heart sounds.  Exam reveals no gallop and no friction rub.   No murmur heard. Pulmonary/Chest:       Decreased breath sounds on the right side of the lung  Abdominal: Soft. Bowel sounds are normal. He exhibits no distension and no mass. There is no tenderness. There is no rebound and no guarding.       On rectal exam there is a significant impaction .  Genitourinary: Prostate normal.  Musculoskeletal: Normal range of motion.  Neurological:       His cranial nerves are intact. He has a tremor at rest involving both hands. He does have worsening of the tremor with intention  Skin:       Stasis changes of both legs the  Psychiatric: He has a normal mood and affect. His behavior is normal.   [facility] reading (PRIMARY) by  Dr. Cleta Alberts NAD .       Assessment & Plan:  I've advised [name] to make an appointment to see Dr. Sandi Mariscal for an eye check. I advised him I would like him to see a neurologist to get reevaluated. I've advised him to exercise just a small amount if he is able to. He is impacted on exam today and have advised to use an enema when he  gets home.

## 2011-11-13 ENCOUNTER — Telehealth: Payer: Self-pay

## 2011-11-13 ENCOUNTER — Other Ambulatory Visit: Payer: Self-pay | Admitting: Emergency Medicine

## 2011-11-13 LAB — CBC WITH DIFFERENTIAL/PLATELET
Eosinophils Relative: 2 % (ref 0–5)
HCT: 43.2 % (ref 39.0–52.0)
Lymphocytes Relative: 17 % (ref 12–46)
Lymphs Abs: 0.8 10*3/uL (ref 0.7–4.0)
MCV: 97.5 fL (ref 78.0–100.0)
Monocytes Absolute: 0.3 10*3/uL (ref 0.1–1.0)
RBC: 4.43 MIL/uL (ref 4.22–5.81)
WBC: 4.6 10*3/uL (ref 4.0–10.5)

## 2011-11-13 LAB — TESTOSTERONE, FREE, TOTAL, SHBG
Sex Hormone Binding: 40 nmol/L (ref 13–71)
Testosterone-% Free: 1.9 % (ref 1.6–2.9)
Testosterone: 583.83 ng/dL (ref 300–890)

## 2011-11-13 LAB — LITHIUM LEVEL: Lithium Lvl: 0.5 mEq/L — ABNORMAL LOW (ref 0.80–1.40)

## 2011-11-13 NOTE — Telephone Encounter (Signed)
Dr Cleta Alberts ordered referral and I let Lupita Leash know in Referrals that we need to try to get pt in to see a GI this week, and if Dr Haywood Pao office can not see him, we can try other practices. Notified pt that referral is being worked on and per Dr Cleta Alberts, explained the importance of pt doing enemas until he can get in to see spec. Pt reported that his wife tried last night and "freaked out" and couldn't do it. Advised pt that he can probably do it himself. Pt seemed unsure but stated he will try. At pt's req, asked Dr Cleta Alberts if we can do enema here if he can not. Advised pt that Dr Cleta Alberts stated he will do it if pt can not.

## 2011-11-13 NOTE — Telephone Encounter (Signed)
Pt called and LM on RN VM asking for email for Dr Cleta Alberts so that he can get in touch w/him today. Spoke w/ pt and told him that Dr Cleta Alberts is here today and I can get a message to him or ask ? Pt stated that Dr Cleta Alberts was going to get him set up to see a GI spec ASAP and wanted him to know that he saw Dr Elnoria Howard several yrs ago for colonoscopy if he thinks that would get him in there more quickly, but would like to see someone ASAP so he would like to see whoever he can get appt w/quickest.

## 2011-11-18 ENCOUNTER — Telehealth: Payer: Self-pay

## 2011-11-18 NOTE — Telephone Encounter (Signed)
PT WOULD LIKE TO KNOW IF HIS THYROID RESULTS ARE IN.

## 2011-11-19 NOTE — Telephone Encounter (Signed)
TEST RESULTS GIVEN.  PT STATES THAT HE HAS BEEN HAVING CHILLS REALLY BAD.  I ADVISED PT TO RTC TO BE EVALUATED

## 2011-11-22 ENCOUNTER — Other Ambulatory Visit: Payer: Self-pay | Admitting: Emergency Medicine

## 2011-11-22 DIAGNOSIS — R251 Tremor, unspecified: Secondary | ICD-10-CM

## 2011-11-23 ENCOUNTER — Telehealth: Payer: Self-pay | Admitting: Emergency Medicine

## 2011-11-25 ENCOUNTER — Telehealth: Payer: Self-pay

## 2011-11-25 NOTE — Telephone Encounter (Signed)
Dr. Cleta Alberts, Pts wife would like to speak with you when possible.

## 2011-11-25 NOTE — Telephone Encounter (Signed)
I called and spoke with Dr. Elnoria Howard. He is going to followup with [name] regarding his GI problems. I also spoke with Dr. Soundra Pilon who reviewed his CT scan and did not see any abnormalities suggestive of recurrent cancer on those films

## 2011-11-25 NOTE — Telephone Encounter (Signed)
Would you like [place] to get more info for you?

## 2011-11-26 ENCOUNTER — Encounter: Payer: Self-pay | Admitting: Emergency Medicine

## 2011-11-26 ENCOUNTER — Ambulatory Visit (INDEPENDENT_AMBULATORY_CARE_PROVIDER_SITE_OTHER): Payer: 59 | Admitting: Emergency Medicine

## 2011-11-26 ENCOUNTER — Ambulatory Visit: Payer: 59

## 2011-11-26 VITALS — BP 129/78 | HR 68 | Temp 97.5°F | Resp 16 | Ht 74.0 in | Wt 190.0 lb

## 2011-11-26 DIAGNOSIS — R109 Unspecified abdominal pain: Secondary | ICD-10-CM

## 2011-11-26 DIAGNOSIS — F3189 Other bipolar disorder: Secondary | ICD-10-CM

## 2011-11-26 DIAGNOSIS — R1084 Generalized abdominal pain: Secondary | ICD-10-CM

## 2011-11-26 DIAGNOSIS — K5900 Constipation, unspecified: Secondary | ICD-10-CM

## 2011-11-26 LAB — POCT CBC
Hemoglobin: 15 g/dL (ref 14.1–18.1)
Lymph, poc: 0.9 (ref 0.6–3.4)
MCH, POC: 32.6 pg — AB (ref 27–31.2)
MCV: 99.2 fL — AB (ref 80–97)
RBC: 4.6 M/uL — AB (ref 4.69–6.13)
WBC: 5.4 10*3/uL (ref 4.6–10.2)

## 2011-11-26 LAB — COMPREHENSIVE METABOLIC PANEL
BUN: 18 mg/dL (ref 6–23)
CO2: 29 mEq/L (ref 19–32)
Calcium: 10 mg/dL (ref 8.4–10.5)
Chloride: 101 mEq/L (ref 96–112)
Creat: 2 mg/dL — ABNORMAL HIGH (ref 0.50–1.35)
Glucose, Bld: 77 mg/dL (ref 70–99)

## 2011-11-26 NOTE — Progress Notes (Signed)
Subjective:    Patient ID: [name], male    DOB: 04-04-1951, 61 y.o.   MRN: [id number]  HPI patient enters with inability to have a bowel movement. This is been going on for months. 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 His psychiatric issues have improved since he is going back on lithium and is also taking Lamictal. He does not had any bowel movements. He states he is afraid the because he knows he's not having bowel movements. He has not had any food stick in his throat actually vomiting but as stated is afraid to eat.    Review of Systems he has had definite worsening of his tremor since he was last seen here     Objective:   Physical Exam his sclerae are not icteric his neck is supple his chest is clear cardiac exam is unremarkable abdominal exam reveals a large scar across the upper abdomen. There is tenderness to palpation in the lower abdomen. There is no rebound noted  [facility] reading (PRIMARY) by  Dr Cleta Alberts x-ray reveals a large area of stool in the right lower colon. There appears to be pockets of gas present in large and small bowel. There is a paucity of stool in the transverse descending and sigmoid colon .   Results for orders placed in visit on 11/26/11  POCT CBC      Component Value Range   WBC 5.4  4.6 - 10.2 (K/uL)   Lymph, poc 0.9  0.6 - 3.4    POC LYMPH PERCENT 17.2  10 - 50 (%L)   MID (cbc) 0.4  0 - 0.9    POC MID % 7.9  0 - 12 (%M)   POC Granulocyte 4.0  2 - 6.9    Granulocyte percent 74.9  37 - 80 (%G)   RBC 4.60 (*) 4.69 - 6.13 (M/uL)   Hemoglobin 15.0  14.1 - 18.1 (g/dL)   HCT, POC 40.9  81.1 - 53.7 (%)   MCV 99.2 (*) 80 - 97 (fL)   MCH, POC 32.6 (*) 27 - 31.2 (pg)   MCHC 32.9  31.8 - 35.4 (g/dL)   RDW, POC 91.4     Platelet Count, POC 212  142 - 424 (K/uL)   MPV 7.1  0 - 99.8 (fL)         Assessment & Plan:  Patient with inability to have a bowel movement due to chronic constipation. I talked to Dr. Karin Golden yesterday who  reviewed all of his scans done at the hospital and did not see any signs of recurrent cancer. I called Dr. Haywood Pao office on Monday and advise him of the situation. They apparently had called [name] but I'm not sure that he is not sure what the plan was. Apparently his parents visited from [place] over the weekend and this was stressful. His wife called yesterday and wanted to speak with me however when I returned her call this morning I had to leave a voicemail. I did advise [name] that I called his wife and left a voicemail. We'll go ahead and check a flat and upright abdomen today. We'll check with Dr. Elnoria Howard to see what the next step is. I discussed case with Dr. Elnoria Howard. He agrees to call patient this afternoon and discuss his treatment. Patient states his wife cannot get him an enema and he is unable to do an enema at home. I told him I would have home health nurses come out  there and help with that and he said he was not going to do that. He states he is not able to take any oral liquids that would cause him to have a bowel movement . Patient was frustrated. I advised him we would continue to try and help him as best we could. He was given a note for work. We'll check with patient after he discusses issues with Dr. Elnoria Howard. Apparently they are not able to do high colonic enemas in the hospital. anymore. I did stop his amlodipine to be sure this was not contributing to his constipation.

## 2011-11-28 ENCOUNTER — Telehealth: Payer: Self-pay

## 2011-11-28 NOTE — Telephone Encounter (Signed)
PT WANTED DR DAUB TO KNOW HE STILL DOESN'T HAVE THE STRENGTH TO DO ANYTHING AND ISN'T EVEN ABLE TO OPEN THE UTENSILS FROM [employer] PLEASE CALL [phone]

## 2011-11-29 NOTE — Telephone Encounter (Signed)
Please call patient and be sure he keeps the appointment with the neurologist who can help [place] evaluate his muscle weakness and neurological issues. Does he want to go out on  FMLA I feel we are able to get his medical problems diagnosed and treated. I will have them call the neurology office again today to see if we can get you an appointment as soon as possible. Please continue to work with Dr. Elnoria Howard to help you with your constipation issue.

## 2011-11-29 NOTE — Telephone Encounter (Signed)
ADVISED PT OF NOTES.  PT WILL THINK ABOUT FMLA.

## 2011-12-02 NOTE — Telephone Encounter (Signed)
Pt's wife [name] would like to speak to Dr.Daub regarding pt's medication she would not give any more info because she states that it is too complicated, please advise.

## 2011-12-11 ENCOUNTER — Telehealth: Payer: Self-pay

## 2011-12-11 NOTE — Telephone Encounter (Signed)
Patient notified and plans to recheck Friday aftn.

## 2011-12-11 NOTE — Telephone Encounter (Signed)
Please let Dr Cleta Alberts know that pt called and would like him to know that he has lost a lot of muscle in legs and arms and would he like for him to come in today, he is aware he leaves at 4pm. And knows he will be here Friday and the week end,.

## 2011-12-11 NOTE — Telephone Encounter (Signed)
Please call patient and let him know I have a doctor's appointment and was not in the office this afternoon. I will be here Friday and be happy to see Fadil at that time.

## 2011-12-14 ENCOUNTER — Other Ambulatory Visit: Payer: Self-pay | Admitting: Physician Assistant

## 2011-12-17 ENCOUNTER — Telehealth: Payer: Self-pay

## 2011-12-17 NOTE — Telephone Encounter (Signed)
pts wife,linda called. She is requesting dr Cleta Alberts call. She refused to leave details, just wants to speak with dr Cleta Alberts

## 2011-12-18 ENCOUNTER — Telehealth: Payer: Self-pay

## 2011-12-18 NOTE — Telephone Encounter (Signed)
Dr. Cleta Alberts   Having difficult time eating and using bathroom.  Please call [phone]

## 2011-12-19 NOTE — Telephone Encounter (Signed)
LMOM for pt w/instr's to cont on Miralax and RTC to see Dr Cleta Alberts. Advised pt Dr Cleta Alberts will be here for the next several days and we will be happy to give pt Dr Ellis Parents hour if he would like to CB to check them.

## 2011-12-19 NOTE — Telephone Encounter (Signed)
Patient needs to continue his MiraLax as instructed by Dr. Elnoria Howard. He's to come and see me to Friday Saturday or Sunday during my shift I will be happy to discuss his situation at the present time.

## 2011-12-26 ENCOUNTER — Telehealth: Payer: Self-pay

## 2011-12-26 NOTE — Telephone Encounter (Signed)
PT STATES HE IS GETTING PROGESSIIVELY WORSE, AT WIT'S END,WILL COME IN SAT TO SEE  DR DAUB BUT WANTED HIM TO HAVE THIS MESSAGE.Marland Kitchen   BEST PHONE [phone]

## 2011-12-27 ENCOUNTER — Other Ambulatory Visit: Payer: Self-pay | Admitting: Emergency Medicine

## 2011-12-29 ENCOUNTER — Ambulatory Visit (INDEPENDENT_AMBULATORY_CARE_PROVIDER_SITE_OTHER): Payer: 59 | Admitting: Emergency Medicine

## 2011-12-29 ENCOUNTER — Ambulatory Visit: Payer: 59

## 2011-12-29 VITALS — BP 117/74 | HR 68 | Temp 97.3°F | Resp 16 | Ht 73.0 in | Wt 190.0 lb

## 2011-12-29 DIAGNOSIS — R251 Tremor, unspecified: Secondary | ICD-10-CM

## 2011-12-29 DIAGNOSIS — R259 Unspecified abnormal involuntary movements: Secondary | ICD-10-CM

## 2011-12-29 DIAGNOSIS — R109 Unspecified abdominal pain: Secondary | ICD-10-CM

## 2011-12-29 DIAGNOSIS — R7989 Other specified abnormal findings of blood chemistry: Secondary | ICD-10-CM

## 2011-12-29 DIAGNOSIS — M6281 Muscle weakness (generalized): Secondary | ICD-10-CM

## 2011-12-29 DIAGNOSIS — F319 Bipolar disorder, unspecified: Secondary | ICD-10-CM

## 2011-12-29 LAB — COMPREHENSIVE METABOLIC PANEL
ALT: 18 U/L (ref 0–53)
AST: 19 U/L (ref 0–37)
CO2: 31 mEq/L (ref 19–32)
Creat: 1.79 mg/dL — ABNORMAL HIGH (ref 0.50–1.35)
Total Bilirubin: 0.7 mg/dL (ref 0.3–1.2)

## 2011-12-29 LAB — POCT CBC
Lymph, poc: 0.8 (ref 0.6–3.4)
MCH, POC: 32.9 pg — AB (ref 27–31.2)
MCHC: 32.3 g/dL (ref 31.8–35.4)
MCV: 101.9 fL — AB (ref 80–97)
MID (cbc): 0.2 (ref 0–0.9)
MPV: 7.3 fL (ref 0–99.8)
POC MID %: 4.7 %M (ref 0–12)
Platelet Count, POC: 158 10*3/uL (ref 142–424)
WBC: 4.9 10*3/uL (ref 4.6–10.2)

## 2011-12-29 LAB — LITHIUM LEVEL: Lithium Lvl: 0.4 mEq/L — ABNORMAL LOW (ref 0.80–1.40)

## 2011-12-29 LAB — CK: Total CK: 44 U/L (ref 7–232)

## 2011-12-29 NOTE — Patient Instructions (Addendum)
You should hear from [place] about your appointment with the neurologist. Please keep that appointment to evaluate for muscular disease. Labs were done today and I will call you when I get the results.

## 2011-12-29 NOTE — Progress Notes (Signed)
  Subjective:    Patient ID: [name], male    DOB: Apr 26, 1951, 61 y.o.   MRN: [id number]  HPI patient enters for recheck. He is up bipolar medications have been changed where he is back on Depakote and lithium and off of Lamictal he is very concerned about loss of muscle mass is a concern about his continued fatigue he did not make the appointment to see the neurologist for muscular evaluation. His wife is on the phone and just said he just couldn't make the appointment .    Review of Systems     Objective:   Physical Exam  Constitutional: He appears well-developed and well-nourished.  HENT:  Head: Normocephalic.  Neck: No tracheal deviation present. No thyromegaly present.  Cardiovascular: Normal rate and regular rhythm.   Pulmonary/Chest: No respiratory distress. He has no wheezes.  Abdominal:       His abdomen is soft. He has some mild discomfort in the lower abdomen but no rebound.  Neurological:       Neurologically his cranial nerves are intact. He does appear to have some loss of muscle mass of his upper extremities. His reflexes are 3+ and symmetrical and there is no clonus noted. He has a significant tremor of the upper extremities which is worse on the right and worse with intention .    [facility] reading (PRIMARY) by  Dr. Cleta Alberts is no evidence of obstruction there is a good amount of gas and stool present  Results for orders placed in visit on 12/29/11  POCT CBC      Component Value Range   WBC 4.9  4.6 - 10.2 K/uL   Lymph, poc 0.8  0.6 - 3.4   POC LYMPH PERCENT 17.3  10 - 50 %L   MID (cbc) 0.2  0 - 0.9   POC MID % 4.7  0 - 12 %M   POC Granulocyte 3.8  2 - 6.9   Granulocyte percent 78.0  37 - 80 %G   RBC 4.71  4.69 - 6.13 M/uL   Hemoglobin 15.5  14.1 - 18.1 g/dL   HCT, POC 11.9  14.7 - 53.7 %   MCV 101.9 (*) 80 - 97 fL   MCH, POC 32.9 (*) 27 - 31.2 pg   MCHC 32.3  31.8 - 35.4 g/dL   RDW, POC 82.9     Platelet Count, POC 158  142 - 424 K/uL   MPV 7.3  0 - 99.8 fL         Assessment & Plan:   Patient still battling with muscle wasting as well as fatigue and constipation. Neurologically he seems about the same. I again want him to see the neurologist for evaluation for some debilitating neuromuscular disorder. He still battling with his bipolar disease. He seems psychologically much improved on lithium and Depakote.

## 2011-12-30 LAB — TESTOSTERONE, FREE, TOTAL, SHBG
Sex Hormone Binding: 32 nmol/L (ref 13–71)
Testosterone, Free: 33.9 pg/mL — ABNORMAL LOW (ref 47.0–244.0)
Testosterone-% Free: 1.9 % (ref 1.6–2.9)
Testosterone: 176.79 ng/dL — ABNORMAL LOW (ref 300–890)

## 2011-12-31 ENCOUNTER — Encounter: Payer: Self-pay | Admitting: Radiology

## 2012-01-02 ENCOUNTER — Telehealth: Payer: Self-pay

## 2012-01-02 NOTE — Telephone Encounter (Signed)
PATIENT WANTS TO KNOW IF HIS LAB RESULTS ARE BACK.  [phone]

## 2012-01-03 NOTE — Telephone Encounter (Signed)
Spoke with patient and went over labs.  Patient states understanding.

## 2012-01-09 ENCOUNTER — Other Ambulatory Visit: Payer: Self-pay | Admitting: Family Medicine

## 2012-01-09 ENCOUNTER — Ambulatory Visit (INDEPENDENT_AMBULATORY_CARE_PROVIDER_SITE_OTHER): Payer: 59 | Admitting: Physician Assistant

## 2012-01-09 DIAGNOSIS — R7989 Other specified abnormal findings of blood chemistry: Secondary | ICD-10-CM

## 2012-01-09 DIAGNOSIS — E236 Other disorders of pituitary gland: Secondary | ICD-10-CM

## 2012-01-09 DIAGNOSIS — N4889 Other specified disorders of penis: Secondary | ICD-10-CM

## 2012-01-11 ENCOUNTER — Other Ambulatory Visit: Payer: Self-pay | Admitting: Physician Assistant

## 2012-01-28 ENCOUNTER — Other Ambulatory Visit: Payer: Self-pay | Admitting: Physician Assistant

## 2012-02-04 ENCOUNTER — Ambulatory Visit (INDEPENDENT_AMBULATORY_CARE_PROVIDER_SITE_OTHER): Payer: 59 | Admitting: Emergency Medicine

## 2012-02-04 VITALS — BP 115/80 | HR 67 | Temp 96.8°F | Resp 18 | Ht 71.0 in | Wt 199.0 lb

## 2012-02-04 DIAGNOSIS — E291 Testicular hypofunction: Secondary | ICD-10-CM

## 2012-02-04 DIAGNOSIS — M6250 Muscle wasting and atrophy, not elsewhere classified, unspecified site: Secondary | ICD-10-CM

## 2012-02-04 DIAGNOSIS — R5383 Other fatigue: Secondary | ICD-10-CM

## 2012-02-04 LAB — CBC WITH DIFFERENTIAL/PLATELET
Basophils Absolute: 0 10*3/uL (ref 0.0–0.1)
Eosinophils Relative: 3 % (ref 0–5)
HCT: 41.5 % (ref 39.0–52.0)
Hemoglobin: 14.6 g/dL (ref 13.0–17.0)
Lymphocytes Relative: 13 % (ref 12–46)
MCHC: 35.2 g/dL (ref 30.0–36.0)
MCV: 91.8 fL (ref 78.0–100.0)
Monocytes Absolute: 0.5 10*3/uL (ref 0.1–1.0)
Monocytes Relative: 10 % (ref 3–12)
Neutro Abs: 3.7 10*3/uL (ref 1.7–7.7)
RDW: 13.3 % (ref 11.5–15.5)
WBC: 5 10*3/uL (ref 4.0–10.5)

## 2012-02-04 MED ORDER — TESTOSTERONE ENANTHATE 200 MG/ML IM SOLN: 200.0000 mg | INTRAMUSCULAR | Status: DC

## 2012-02-04 NOTE — Progress Notes (Signed)
  Subjective:    Patient ID: [name], male    DOB: Mar 12, 1951, 61 y.o.   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 He is now stable on his current medications. He continues to complain of foul smelling stool that he feels it has been in his: For long periods of time. He could not tolerate the prep and so has not had a colonoscopy but he has had an endoscopy. He seen Dr. Terrace Arabia and gone through a thorough evaluation. He has pain in his neck and diffuse weakness in all of his muscles. Tremor of his right arm and left arm but worse on the right. He is no longer able to concentrate at work and has had significant troubles with memory. There have been questions about his stability for driving. He is able to eat better his bowel movements have become more regular. He has been able to put on some weight.    Review of Systems     Objective:   Physical Exam patient is mildly agitated but very cooperative. Neck exam reveals tenderness over the posterior cervical area. He has a significant tremor at rest in the right arm and a mild tremor on the left. His gait is somewhat unstable. There are no other focal neurological signs. The abdomen is flat there is a large scar over the upper abdomen without masses. Testicles are small and atrophic. Rectal exam did not reveal any masses in the rectal vault        Assessment & Plan:  [name] going to apply for short term disability for now. He is instructed he can only drive short distances. I advised him he should consider getting a personal trainer to help him work on core muscles. I did go ahead and do a CBC CPK and sedimentation rate today. I told him he had to go ahead with his colonoscopy and do the best he can with the prep I told him to continue to followup with Dr. Terrace Arabia and to finish out the  cognitive function testing she has for him. We are also going to resume his testosterone shots.

## 2012-02-06 ENCOUNTER — Telehealth: Payer: Self-pay

## 2012-02-06 NOTE — Telephone Encounter (Signed)
PT STATES HE IS SUMMONS FOR JURY DUTY, WOULD LIKE TO HAVE A NOTE WRITTEN EXCUSING HIM. PLEASE CALL [phone] WHEN READY

## 2012-02-07 ENCOUNTER — Ambulatory Visit: Payer: 59 | Admitting: Family Medicine

## 2012-02-07 DIAGNOSIS — R7989 Other specified abnormal findings of blood chemistry: Secondary | ICD-10-CM

## 2012-02-07 DIAGNOSIS — N529 Male erectile dysfunction, unspecified: Secondary | ICD-10-CM

## 2012-02-07 NOTE — Telephone Encounter (Signed)
Letter printed and in Dr Ellis Parents box to sign. Called and asked wife if letter can wait until Dr Cleta Alberts is back next week and she stated that will be fine, he doesn't need until Oct.

## 2012-02-07 NOTE — Telephone Encounter (Signed)
Please type a letter which states that [name] is unable to go to jury duty due to his underlying neurological disease and bipolar disorder. Please type this up and I will be happy to sign it for him.

## 2012-02-10 NOTE — Telephone Encounter (Signed)
Patient notified and letter in pick up drawer.  Also needs to discuss FMLA, but plans to [facility] and talk to [facility] later about this.

## 2012-02-11 ENCOUNTER — Telehealth: Payer: Self-pay | Admitting: Family Medicine

## 2012-02-11 NOTE — Telephone Encounter (Signed)
Pt called to find out if [employer] had contacted Maudia about forms not having enough information? He could not provide details since his wife was the one that talked to them. He would like to speak with Maudia about this.

## 2012-02-18 ENCOUNTER — Telehealth: Payer: Self-pay

## 2012-02-18 NOTE — Telephone Encounter (Signed)
Patient wants to speak with Dr. Cleta Alberts about worsening stomach condition. Has been referred to gastroenterologist but it was no help. He is in a lot of pain and very concerned.

## 2012-02-19 ENCOUNTER — Other Ambulatory Visit: Payer: Self-pay | Admitting: Emergency Medicine

## 2012-02-19 DIAGNOSIS — C649 Malignant neoplasm of unspecified kidney, except renal pelvis: Secondary | ICD-10-CM

## 2012-02-19 DIAGNOSIS — R109 Unspecified abdominal pain: Secondary | ICD-10-CM

## 2012-02-19 NOTE — Telephone Encounter (Signed)
[name] can tell him I have placed an order for a CT of his abdomen no contrast to reevaluate his abdomen. Ask him if he called Dr. Elnoria Howard so he did get a referral to [facility] for reevaluation and a second opinion. Tell him I would be happy to see him either today or tomorrow at 102 to evaluate.

## 2012-02-20 ENCOUNTER — Other Ambulatory Visit: Payer: Self-pay | Admitting: Emergency Medicine

## 2012-02-20 ENCOUNTER — Telehealth: Payer: Self-pay

## 2012-02-20 DIAGNOSIS — R109 Unspecified abdominal pain: Secondary | ICD-10-CM

## 2012-02-20 NOTE — Telephone Encounter (Signed)
Spoke with [name] and advised [name] would be a second opinion doctor if he needed. Advised he can still see Dr Elnoria Howard and we would follow up with him after CT scan tomorrow. [name] is coming to see Dr [name] on Sunday in the morning. To [facility]

## 2012-02-20 NOTE — Telephone Encounter (Signed)
Patient states someone called him today about ref from Dr. Elnoria Howard to [facility]? Patient states he does not know anything about this.

## 2012-02-20 NOTE — Telephone Encounter (Signed)
Called pt at Dr Ellis Parents request and advised him that Dr Cleta Alberts said that he could come in to see him this Sunday and gave him Dr Ellis Parents hours. Pt agreed.

## 2012-02-20 NOTE — Telephone Encounter (Signed)
Spoke with pt's wife advised message from Dr Cleta Alberts, he is going for CT scan tomorrow. We can follow up with pt then

## 2012-02-21 ENCOUNTER — Ambulatory Visit (INDEPENDENT_AMBULATORY_CARE_PROVIDER_SITE_OTHER): Payer: Self-pay | Admitting: Family Medicine

## 2012-02-21 ENCOUNTER — Ambulatory Visit
Admission: RE | Admit: 2012-02-21 | Discharge: 2012-02-21 | Disposition: A | Discharge status: Home / Self Care | Payer: 59 | Source: Ambulatory Visit | Attending: Emergency Medicine | Admitting: Emergency Medicine

## 2012-02-21 DIAGNOSIS — E291 Testicular hypofunction: Secondary | ICD-10-CM

## 2012-02-21 DIAGNOSIS — C649 Malignant neoplasm of unspecified kidney, except renal pelvis: Secondary | ICD-10-CM

## 2012-02-21 DIAGNOSIS — R7989 Other specified abnormal findings of blood chemistry: Secondary | ICD-10-CM

## 2012-02-21 DIAGNOSIS — R109 Unspecified abdominal pain: Secondary | ICD-10-CM

## 2012-02-21 MED ORDER — TESTOSTERONE ENANTHATE 200 MG/ML IM SOLN
200.0000 mg | INTRAMUSCULAR | Status: DC
  Administered 2012-02-21: 200 mg via INTRAMUSCULAR

## 2012-02-23 ENCOUNTER — Ambulatory Visit (INDEPENDENT_AMBULATORY_CARE_PROVIDER_SITE_OTHER): Payer: Self-pay | Admitting: Emergency Medicine

## 2012-02-23 VITALS — BP 127/77 | HR 58 | Temp 97.5°F | Resp 14 | Ht 73.0 in | Wt 196.0 lb

## 2012-02-23 DIAGNOSIS — R109 Unspecified abdominal pain: Secondary | ICD-10-CM

## 2012-02-23 DIAGNOSIS — C649 Malignant neoplasm of unspecified kidney, except renal pelvis: Secondary | ICD-10-CM

## 2012-02-23 DIAGNOSIS — F319 Bipolar disorder, unspecified: Secondary | ICD-10-CM

## 2012-02-23 LAB — COMPREHENSIVE METABOLIC PANEL
AST: 15 U/L (ref 0–37)
BUN: 29 mg/dL — ABNORMAL HIGH (ref 6–23)
CO2: 30 mEq/L (ref 19–32)
Calcium: 10.1 mg/dL (ref 8.4–10.5)
Chloride: 104 mEq/L (ref 96–112)
Creat: 2 mg/dL — ABNORMAL HIGH (ref 0.50–1.35)
Total Bilirubin: 0.7 mg/dL (ref 0.3–1.2)

## 2012-02-23 LAB — POCT CBC
Granulocyte percent: 77.8 %G (ref 37–80)
Hemoglobin: 14.8 g/dL (ref 14.1–18.1)
MID (cbc): 0.4 (ref 0–0.9)
MPV: 7.5 fL (ref 0–99.8)
POC Granulocyte: 4.8 (ref 2–6.9)
POC MID %: 7 %M (ref 0–12)
Platelet Count, POC: 147 10*3/uL (ref 142–424)
RBC: 4.68 M/uL — AB (ref 4.69–6.13)

## 2012-02-23 LAB — POCT SEDIMENTATION RATE: POCT SED RATE: 10 mm/hr (ref 0–22)

## 2012-02-23 LAB — TSH: TSH: 3.173 u[IU]/mL (ref 0.350–4.500)

## 2012-02-23 NOTE — Progress Notes (Signed)
  Subjective:    Patient ID: [name], male    DOB: Jul 18, 1950, 61 y.o.   MRN: [id number]  HPI He is still concerned that he has not improved his muscle strength or his  muscle mass. He is now taking his testosterone shots on a regular basis is going to the gym and working out on a regular basis. He recently completed his CT of the abdomen and this did not reveal any signs of cancer. He did have small renal calculi that were nonobstructing. He continues to be bothered with cognitive dysfunction as well persistent severe tremor right arm greater than left. He states he has no appetite but according to the wife he does eat well and by records has not had any significant weight loss. He still has difficulty with bowel movements on a regular basis. He has persistent pain in his neck with soreness in the muscles of his neck.    Review of Systems patient went to the neurologist for evaluation of tremor as well as cognitive dysfunction. He's had an evaluation by EMG nerve conduction studies. He's been through a thorough evaluation by Dr. Elnoria Howard. He is requesting referral to Dr. Kinnie Scales for his evaluation. 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 His tremors worse in the right arm than the left his chest is clear to auscultation the abdomen has a large scar across the upper abdomen without tenderness or masses palpable. He has mild tenderness over the posterior portion of his neck  Results for orders placed in visit on 02/23/12  POCT CBC      Component Value Range   WBC 6.2  4.6 - 10.2 K/uL   Lymph, poc 0.9  0.6 - 3.4   POC LYMPH PERCENT 15.2  10 - 50 %L   MID (cbc) 0.4  0 - 0.9   POC MID % 7.0  0 - 12 %M   POC Granulocyte 4.8  2 - 6.9   Granulocyte percent 77.8  37 - 80 %G   RBC 4.68 (*) 4.69 - 6.13 M/uL   Hemoglobin 14.8  14.1 - 18.1 g/dL   HCT, POC 82.9  56.2 - 53.7 %   MCV 100.6 (*) 80 - 97 fL   MCH, POC 31.6 (*) 27 - 31.2 pg   MCHC 31.4 (*) 31.8 - 35.4 g/dL   RDW, POC 13.0     Platelet Count, POC 147  142 - 424 K/uL   MPV 7.5  0 - 99.8 fL        Assessment & Plan:  I think most of Cecilio's problems with his muscles relate to his inactivity and his inability to take his testosterone shots for 3-4 months. He is on a every 2 week schedule now. He is going to the gym regularly. He is taking his medications and is currently on lithium and Depakote. I will talk with Dolores Frame about finishing his disability forms he will let me know if he decides to see the GI doctor in [place]. I check his routine labs today.Marland Kitchen

## 2012-02-25 ENCOUNTER — Telehealth: Payer: Self-pay

## 2012-02-26 ENCOUNTER — Telehealth: Payer: Self-pay | Admitting: Radiology

## 2012-02-26 NOTE — Telephone Encounter (Signed)
Message copied by Caffie Damme on Wed Feb 26, 2012 10:33 AM ------      Message from: Lesle Chris A      Created: Tue Feb 25, 2012  3:45 PM      Regarding: RE: [facility]       He needs to follow all the instructions he received from Dr.Hungs office. I would advise him to talk to Dr. Elnoria Howard and ask Him where he would like to refer him for a second opinion .                                          ----- Message -----         From: Leonia Reeves         Sent: 02/25/2012   3:38 PM           To: Collene Gobble, MD      Subject: Isidore Mayon                                               DR DAUB,      MR. ROYALWANTED YOU TO KNOW THAT DR. METOFF's OFFICE CALLED AND SAID THAT HE WILL NOT TAKE HIM AS A PATIENT. HE ALSO WANTED YOU TO KNOW THAT HE IS GOING TO DO A COLONOSCOPY PREP BUT HE IS NOT HAVING A COLONOSCOPY.

## 2012-02-26 NOTE — Telephone Encounter (Signed)
I have called patient to advise. Dr Elnoria Howard has advised him to do the prep as if he were having colonoscopy, then to call him back. I have advised him Dr Rolla Etienne office should advise him where to go for 2nd opinion. Patient wants to see Dr Dionicia Abler in [place], I have told patient to ask Dr Elnoria Howard to send his records to Dr Dionicia Abler. Patient states he feels as if Dr Elnoria Howard will get angry if he asks for 2nd opinion. I advised physicians routinely have patients ask for 2nd opinions and I have never had a physician deny this. Patient advised if he has trouble with Dr Elnoria Howard not helping him with the 2nd opinion he is to call me back, but I do not expect he will have any problems.  Patient has come in to the office now and is wanting to know my last name, I have had front office advise him I am [name] and I work with Dr Cleta Alberts. Patient advised again to follow up with Dr Elnoria Howard.   FYI only, no action needed

## 2012-02-28 ENCOUNTER — Telehealth: Payer: Self-pay

## 2012-02-28 NOTE — Telephone Encounter (Signed)
Pt wife called again, ppwk needed for appt on Monday. Please call her at [phone].

## 2012-02-28 NOTE — Telephone Encounter (Signed)
PT STATES HE HAD GIVEN DR DAUB SOME FORMS FROM [employer] AND HE NEED THEM TO TAKE TO HIS DR'S APPT ON Monday, I LOOKED ALL THROUGH HIS CHART AND DO NOT SEE ANY PAPERS HE IS SPEAKING ABOUT. PLEASE CALL PT AT [phone] WHEN FOUND.  DIDN'T KNOW IF WE NEEDED TO CALL HIS COMPANY TO SEE IF WE CAN OBTAIN MORE

## 2012-03-01 NOTE — Telephone Encounter (Signed)
void

## 2012-03-02 NOTE — Telephone Encounter (Signed)
Unsure what papers he needs. Have called his wife to find out what he needs so perhaps I can help. Left message for her to call me back.

## 2012-03-03 NOTE — Telephone Encounter (Signed)
Still no communication from patient or wife, this was needed yesterday. Hopefully he has found what he needs, if not left another message for them to call me back.

## 2012-03-04 ENCOUNTER — Other Ambulatory Visit (INDEPENDENT_AMBULATORY_CARE_PROVIDER_SITE_OTHER): Payer: 59 | Admitting: Emergency Medicine

## 2012-03-04 ENCOUNTER — Ambulatory Visit: Payer: 59

## 2012-03-04 VITALS — BP 142/80 | HR 55 | Temp 97.5°F | Resp 16 | Ht 73.0 in | Wt 194.0 lb

## 2012-03-04 DIAGNOSIS — F319 Bipolar disorder, unspecified: Secondary | ICD-10-CM

## 2012-03-04 DIAGNOSIS — M79671 Pain in right foot: Secondary | ICD-10-CM

## 2012-03-04 DIAGNOSIS — M6250 Muscle wasting and atrophy, not elsewhere classified, unspecified site: Secondary | ICD-10-CM

## 2012-03-04 DIAGNOSIS — M79609 Pain in unspecified limb: Secondary | ICD-10-CM

## 2012-03-04 DIAGNOSIS — Z23 Encounter for immunization: Secondary | ICD-10-CM

## 2012-03-04 DIAGNOSIS — R259 Unspecified abnormal involuntary movements: Secondary | ICD-10-CM

## 2012-03-04 DIAGNOSIS — R251 Tremor, unspecified: Secondary | ICD-10-CM

## 2012-03-04 LAB — T4, FREE: Free T4: 1.11 ng/dL (ref 0.80–1.80)

## 2012-03-04 LAB — POCT CBC
Granulocyte percent: 76 %G (ref 37–80)
HCT, POC: 48.6 % (ref 43.5–53.7)
Hemoglobin: 15 g/dL (ref 14.1–18.1)
MCV: 101.4 fL — AB (ref 80–97)
POC Granulocyte: 3.2 (ref 2–6.9)
RBC: 4.79 M/uL (ref 4.69–6.13)

## 2012-03-04 LAB — COMPREHENSIVE METABOLIC PANEL
ALT: 11 U/L (ref 0–53)
CO2: 29 mEq/L (ref 19–32)
Calcium: 9.7 mg/dL (ref 8.4–10.5)
Chloride: 104 mEq/L (ref 96–112)
Glucose, Bld: 86 mg/dL (ref 70–99)
Sodium: 135 mEq/L (ref 135–145)
Total Protein: 7 g/dL (ref 6.0–8.3)

## 2012-03-04 LAB — POCT SEDIMENTATION RATE: POCT SED RATE: 6 mm/hr (ref 0–22)

## 2012-03-04 LAB — TSH: TSH: 3.942 u[IU]/mL (ref 0.350–4.500)

## 2012-03-04 NOTE — Progress Notes (Signed)
  Subjective:    Patient ID: [name], male    DOB: [birth year], 61 y.o.   MRN: [id number]  HPI patient with a complicated history. He is here today requesting medication to improve his appetite however he has not had any weight loss over the last 6 months. He has severe bipolar disease and is on medications for this. He went to an episode that the first the year where he was catatonic and had to be admitted x2 to behavioral health. He presents today with worsening weakness in his legs. He was able to do a trial prep to see if he can tolerate the prep for a colonoscopy and he was able to do that. He's been to see Dr. Donell Beers and levels are requested of his lithium and Depakote. He's been to see his neurologist to be evaluated for muscle wasting disorder. He is also complaining of tenderness in his left foot occulting walking and an unsteady gait.   Review of Systems     Objective:   Physical Exam patient has bilateral upper extremity intention tremors. He has some stiffness of the upper and lower extremities but no true cogwheeling. His neck is supple. His chest is clear to ossification percussion. His cardiac exam is unremarkable. He does have reflexes in the upper and lower extremities. He has tenderness over the dorsum of the left foot the  [facility] reading (PRIMARY) by  Dr.Lorell Thibodaux the patient has had surgery on both feet. He has residual hardware in his left foot no acute changes are seen  Results for orders placed in visit on 03/04/12  POCT CBC      Component Value Range   WBC 4.2 (*) 4.6 - 10.2 K/uL   Lymph, poc 0.7  0.6 - 3.4   POC LYMPH PERCENT 16.8  10 - 50 %L   MID (cbc) 0.3  0 - 0.9   POC MID % 7.2  0 - 12 %M   POC Granulocyte 3.2  2 - 6.9   Granulocyte percent 76.0  37 - 80 %G   RBC 4.79  4.69 - 6.13 M/uL   Hemoglobin 15.0  14.1 - 18.1 g/dL   HCT, POC 40.9  81.1 - 53.7 %   MCV 101.4 (*) 80 - 97 fL   MCH, POC 31.3 (*) 27 - 31.2 pg   MCHC 30.9 (*) 31.8 - 35.4 g/dL   RDW, POC 91.4      Platelet Count, POC 169  142 - 424 K/uL   MPV 7.6  0 - 99.8 fL        Assessment & Plan:  Patient suffered from severe bipolar disease. He is currently out of work on disability. He is scheduled for an endoscopy colonoscopy and hopefully he will proceed with this since he was able to tolerate his prep. I have requested copies of his records from the neurologist regarding his tremor and muscle weakness. Labs were done today we'll check a lithium level as well as Depakote levels and send these results to his psychiatrist .

## 2012-03-05 LAB — LITHIUM LEVEL: Lithium Lvl: 0.5 mEq/L — ABNORMAL LOW (ref 0.80–1.40)

## 2012-03-05 LAB — VALPROIC ACID LEVEL: Valproic Acid Lvl: 41.5 ug/mL — ABNORMAL LOW (ref 50.0–100.0)

## 2012-03-08 ENCOUNTER — Encounter: Payer: Self-pay | Admitting: *Deleted

## 2012-03-09 ENCOUNTER — Ambulatory Visit (INDEPENDENT_AMBULATORY_CARE_PROVIDER_SITE_OTHER): Payer: 59 | Admitting: Physician Assistant

## 2012-03-09 DIAGNOSIS — E291 Testicular hypofunction: Secondary | ICD-10-CM

## 2012-03-09 DIAGNOSIS — R7989 Other specified abnormal findings of blood chemistry: Secondary | ICD-10-CM

## 2012-03-09 MED ORDER — TESTOSTERONE CYPIONATE 200 MG/ML IM SOLN
200.0000 mg | Freq: Once | INTRAMUSCULAR | Status: AC
  Administered 2012-03-09: 200 mg via INTRAMUSCULAR

## 2012-03-16 ENCOUNTER — Telehealth: Payer: Self-pay

## 2012-03-16 NOTE — Telephone Encounter (Signed)
Pt states he has seen dr Cleta Alberts multiple times for body temperature issues. He states he is cold all of the time and doesn't know why. States he has to take scalding showers just to feel warm. Is worried about upcoming winter and staying warm. Thinks it is an iron problem, but states he has had lab work done that doesn't support that idea. States dr Cleta Alberts thinks it is his depression meds causing the problem.   Pt would like to talk to dr daub about ideas to help him  Best: [phone]  bf

## 2012-03-17 NOTE — Telephone Encounter (Signed)
Please call patient and ask him if he would like to see an endocrinologist who deals with the cold thyroid disorders who may be able to further evaluate him for his sensation of feeling cold all the time. He would like me to go ahead and make that referral I would be happy to do that. Make the referral to either Dr. Talmage Nap were Dr. Everardo All

## 2012-03-17 NOTE — Telephone Encounter (Signed)
Please advise, and I will call patient back

## 2012-03-18 NOTE — Telephone Encounter (Signed)
I have called patient and now he has new complaint. He states since DrDaub looked in his ears they are sensitive. He reports his ears are more sensitive to sound and are painful, wants you to advise on this. I have told him we can refer to Endocrinology for the feeling of cold, I have put in  Referral since he is agreeable to this. Please let me know what I can tell him about his ears and I can call him back.

## 2012-03-18 NOTE — Telephone Encounter (Signed)
Ask patient to try hydrocortisone cream 1% to apply to the entrance to both ears twice a day. If his ears do not feel better by tomorrow he will need to come in and let me reexamine his ears.

## 2012-03-19 ENCOUNTER — Encounter: Payer: Self-pay | Admitting: Emergency Medicine

## 2012-03-19 NOTE — Telephone Encounter (Signed)
Left message for patient to advise of this, advised if he has other questions to call me back Harsh Trulock

## 2012-03-23 ENCOUNTER — Ambulatory Visit (INDEPENDENT_AMBULATORY_CARE_PROVIDER_SITE_OTHER): Payer: 59 | Admitting: Endocrinology

## 2012-03-23 ENCOUNTER — Encounter: Payer: Self-pay | Admitting: Endocrinology

## 2012-03-23 VITALS — BP 130/80 | HR 61 | Temp 97.5°F | Resp 16 | Ht 74.25 in | Wt 197.5 lb

## 2012-03-23 DIAGNOSIS — E291 Testicular hypofunction: Secondary | ICD-10-CM | POA: Insufficient documentation

## 2012-03-23 MED ORDER — NIACIN ER (ANTIHYPERLIPIDEMIC) 500 MG PO TBCR: EXTENDED_RELEASE_TABLET | ORAL | Status: DC

## 2012-03-23 NOTE — Progress Notes (Signed)
Subjective:    Patient ID: [name], male    DOB: 1950/08/30, 61 y.o.   MRN: [id number]  HPI Pt states 1 year of moderate tremor of the hands, and assoc cold intolerance. He receives testosterone injections.  He requests bone-density test. Past Medical History  Diagnosis Date  . Renal cell carcinoma     chemo and xrt--lost rt kidney  . Childhood asthma   . Sinusitis   . Cellulitis     after left foot surgery  . Osteoarthritis   . OSA on CPAP     NPSG 05-08-07 AHI 63.1/hr,desat t0 80%/loud snoring  . Bipolar 1 disorder   . Hyperlipemia   . Mental disorder   . Depression   . GERD (gastroesophageal reflux disease)   . Hypertension     Past Surgical History  Procedure Date  . Podiatric surgery     left foot toes,   . Bunionectomy   . Nephrectomy 1998    rt., d/t cancer  . Cholecystectomy   . Colon surgery   . Kidney surgery 1998    Rt Kidney Removal     History   Social History  . Marital Status: Married    Spouse Name: N/A    Number of Children: 0  . Years of Education: N/A   Occupational History  . [employer]-product developer    Social History Main Topics  . Smoking status: Never Smoker   . Smokeless tobacco: Never Used  . Alcohol Use: Yes     6 beers a uear  . Drug Use: No  . Sexually Active: Not Currently   Other Topics Concern  . Not on file   Social History Narrative  . No narrative on file    Current Outpatient Prescriptions on File Prior to Visit  Medication Sig Dispense Refill  . buPROPion (WELLBUTRIN XL) 150 MG 24 hr tablet       . [facility] STOOL SOFTENER 100 MG capsule TAKE 2 TO 3 CAPSULES BY MOUTH DAILY.  60 capsule  2  . divalproex (DEPAKOTE) 250 MG DR tablet Take 250 mg by mouth 3 (three) times daily.      Marland Kitchen docusate sodium (COLACE) 100 MG capsule Take 100 mg by mouth 2 (two) times daily.      . feeding supplement (ENSURE IMMUNE HEALTH) LIQD Take 237 mLs by mouth 3 (three) times daily with meals.      Marland Kitchen lithium carbonate (LITHOBID)  300 MG CR tablet Take 1 tablet (300 mg total) by mouth every evening.  20 tablet  0  . LORazepam (ATIVAN) 0.5 MG tablet Take 0.5 mg by mouth every 8 (eight) hours.      . temazepam (RESTORIL) 15 MG capsule Take 15 mg by mouth at bedtime as needed.      Marland Kitchen amLODipine (NORVASC) 10 MG tablet TAKE 1 TABLET BY MOUTH DAILY  30 tablet  2  . lamoTRIgine (LAMICTAL) 100 MG tablet Take 100 mg by mouth daily.      . Multiple Vitamin (MULTIVITAMIN) tablet Take 1 tablet by mouth daily. For nutritional supplementation.  30 tablet  0  . pantoprazole (PROTONIX) 40 MG tablet       . testosterone enanthate (DELATESTRYL) 200 MG/ML injection Inject 1 mL (200 mg total) into the muscle every 14 (fourteen) days. For IM use only  5 mL  5  . zolpidem (AMBIEN) 5 MG tablet Take 5 mg by mouth at bedtime as needed.      Marland Kitchen DISCONTD:  mirtazapine (REMERON) 30 MG tablet Take by mouth ONE FOURTH tablet at bed time For insomnia.  1 tablet  0  . DISCONTD: pravastatin (PRAVACHOL) 40 MG tablet Take 1 tablet (40 mg total) by mouth daily. To help lower cholesterol.  30 tablet  0    No Known Allergies  Family History  Problem Relation Age of Onset  . Hypertension    . Sleep apnea    . Emphysema    . Prostate cancer    . Arthritis Father     BP 130/80  Pulse 61  Temp 97.5 F (36.4 C) (Oral)  Resp 16  Ht 6' 2.25" (1.886 m)  Wt 197 lb 8 oz (89.585 kg)  BMI 25.19 kg/m2  SpO2 95%    Review of Systems denies cramps, cough, fever, blurry vision, myalgias, dry skin, rhinorrhea, easy bruising, and syncope.  He has doe, depression, constipation, poor memory, hair loss, weight loss, numbness of the feet, and myalgias.    Objective:   Physical Exam VS: see vs page GEN: no distress HEAD: head: no deformity eyes: no periorbital swelling, no proptosis external nose and ears are normal mouth: no lesion seen NECK: supple, thyroid is not enlarged CHEST WALL: no deformity LUNGS: clear to auscultation BREASTS:  No  gynecomastia CV: reg rate and rhythm, no murmur ABD: abdomen is soft, nontender.  no hepatosplenomegaly.  not distended.  no hernia. GENITALIA: Normal male testicles, scrotum, and penis MUSCULOSKELETAL: muscle bulk and strength are grossly normal.  no obvious joint swelling.  gait is normal and steady EXTEMITIES: no deformity.  no ulcer on the feet.  feet are of normal color and temp.  no edema PULSES: dorsalis pedis intact bilat.  no carotid bruit NEURO:  cn 2-12 grossly intact.   readily moves all 4's.  sensation is intact to touch on the feet.  Moderate tremor of the hands. SKIN:  Normal texture and temperature.  No rash or suspicious lesion is visible.   NODES:  None palpable at the neck.  PSYCH: alert, oriented x3.  Does not appear anxious nor depressed.     Assessment & Plan:  Cold intolerance, usually related to weight-loss Hypogonadism.  He requests bone-density test Bipolar affective disorder.  This limits interpretation of sxs.

## 2012-03-23 NOTE — Patient Instructions (Addendum)
Let's check a bone-density test.  you will receive a phone call, about a day and time for an appointment. i have sent a prescription to your pharmacy, for a pill against the coldness. i would be happy to see you back here whenever you want.

## 2012-03-24 ENCOUNTER — Other Ambulatory Visit: Payer: Self-pay | Admitting: Emergency Medicine

## 2012-03-24 ENCOUNTER — Ambulatory Visit (INDEPENDENT_AMBULATORY_CARE_PROVIDER_SITE_OTHER): Payer: 59 | Admitting: *Deleted

## 2012-03-24 DIAGNOSIS — E291 Testicular hypofunction: Secondary | ICD-10-CM

## 2012-03-24 MED ORDER — TESTOSTERONE ENANTHATE 200 MG/ML IM SOLN: 200.0000 mg | Freq: Once | INTRAMUSCULAR | Status: DC

## 2012-03-24 MED ORDER — TESTOSTERONE CYPIONATE 100 MG/ML IM SOLN
200.0000 mg | Freq: Once | INTRAMUSCULAR | Status: AC
  Administered 2012-03-24: 200 mg via INTRAMUSCULAR

## 2012-03-25 ENCOUNTER — Ambulatory Visit (INDEPENDENT_AMBULATORY_CARE_PROVIDER_SITE_OTHER)
Admission: RE | Admit: 2012-03-25 | Discharge: 2012-03-25 | Disposition: A | Discharge status: Home / Self Care | Payer: 59 | Source: Ambulatory Visit

## 2012-03-25 ENCOUNTER — Other Ambulatory Visit: Payer: Self-pay

## 2012-03-25 DIAGNOSIS — E291 Testicular hypofunction: Secondary | ICD-10-CM

## 2012-03-30 DIAGNOSIS — R413 Other amnesia: Secondary | ICD-10-CM

## 2012-03-30 DIAGNOSIS — F319 Bipolar disorder, unspecified: Secondary | ICD-10-CM

## 2012-03-30 NOTE — Progress Notes (Signed)
LMOVM for pt to rtn call 

## 2012-03-31 ENCOUNTER — Telehealth: Payer: Self-pay | Admitting: Endocrinology

## 2012-03-31 NOTE — Telephone Encounter (Signed)
Pt returning your call per his wife. Please call back.

## 2012-03-31 NOTE — Telephone Encounter (Signed)
Pt states that he is having back pain and wants to know if there is anything that he may be able to take. Also complaining of hearing, chewing, and is feeling irrated over little things. States he "feels like he is falling apart" . Please advise.

## 2012-03-31 NOTE — Telephone Encounter (Signed)
Pt advised per dr. Everardo All' message and states an understanding

## 2012-03-31 NOTE — Telephone Encounter (Signed)
please call patient: The back pain is out of the scope of my practice. Please ask PCP

## 2012-04-01 ENCOUNTER — Telehealth: Payer: Self-pay | Admitting: Nutrition

## 2012-04-01 ENCOUNTER — Telehealth: Payer: Self-pay | Admitting: Endocrinology

## 2012-04-01 ENCOUNTER — Telehealth: Payer: Self-pay

## 2012-04-01 NOTE — Telephone Encounter (Signed)
Jessica from Dr. Gregary Signs Ellison's office called to discuss the patient with clinical staff.  Shanda Bumps stated that Mr. Dettmann has called Dr. George Hugh office every day for last week regarding back pain.  Please call Shanda Bumps at [phone].

## 2012-04-01 NOTE — Telephone Encounter (Signed)
Pt is experiencing severe back pain. He believes this pain could be a symptom of malnutrition. Does Dr. Everardo All know anyone in the area who specializes in this type of treatment?

## 2012-04-01 NOTE — Telephone Encounter (Signed)
I called Shanda Bumps, he calls here also, I advised her patient upset Dr Cleta Alberts is on missions trip, but patient does have plans to come here tomorrow.

## 2012-04-01 NOTE — Telephone Encounter (Signed)
Pt has an appt with Dr. Ellis Parents office tomorrow 10/17

## 2012-04-01 NOTE — Telephone Encounter (Signed)
i don't.  Your best resource is dr Cleta Alberts

## 2012-04-01 NOTE — Telephone Encounter (Signed)
Patient called and left me a message to return his call. He states he suffers from malnutrition. He would like assistance with this problem. I am unable to determine if patient is being actively followed by an oncologist at [facility]. I have requested patient return my call so that I can refer him to the appropriate place for nutrition counseling.

## 2012-04-02 ENCOUNTER — Ambulatory Visit: Payer: 59

## 2012-04-03 ENCOUNTER — Ambulatory Visit (INDEPENDENT_AMBULATORY_CARE_PROVIDER_SITE_OTHER): Payer: 59 | Admitting: Family Medicine

## 2012-04-03 ENCOUNTER — Telehealth: Payer: Self-pay

## 2012-04-03 ENCOUNTER — Ambulatory Visit: Payer: 59

## 2012-04-03 VITALS — BP 126/80 | HR 90 | Temp 98.0°F | Resp 16 | Ht 73.0 in | Wt 198.0 lb

## 2012-04-03 DIAGNOSIS — R5383 Other fatigue: Secondary | ICD-10-CM

## 2012-04-03 DIAGNOSIS — F319 Bipolar disorder, unspecified: Secondary | ICD-10-CM

## 2012-04-03 DIAGNOSIS — M549 Dorsalgia, unspecified: Secondary | ICD-10-CM

## 2012-04-03 DIAGNOSIS — R059 Cough, unspecified: Secondary | ICD-10-CM

## 2012-04-03 DIAGNOSIS — R5381 Other malaise: Secondary | ICD-10-CM

## 2012-04-03 DIAGNOSIS — E291 Testicular hypofunction: Secondary | ICD-10-CM

## 2012-04-03 DIAGNOSIS — H908 Mixed conductive and sensorineural hearing loss, unspecified: Secondary | ICD-10-CM

## 2012-04-03 DIAGNOSIS — R413 Other amnesia: Secondary | ICD-10-CM

## 2012-04-03 DIAGNOSIS — R05 Cough: Secondary | ICD-10-CM

## 2012-04-03 LAB — POCT CBC
HCT, POC: 49.3 % (ref 43.5–53.7)
Hemoglobin: 15.6 g/dL (ref 14.1–18.1)
MCH, POC: 32.4 pg — AB (ref 27–31.2)
MCV: 102.3 fL — AB (ref 80–97)
RBC: 4.82 M/uL (ref 4.69–6.13)
WBC: 7.1 10*3/uL (ref 4.6–10.2)

## 2012-04-03 NOTE — Progress Notes (Signed)
[facility] [address], [place] [address] [phone]  Date:  04/03/2012   Name:  [name]   DOB:  1951-02-13   MRN:  [id number]  PCP:  Lucilla Edin, MD    Chief Complaint: Manic Behavior, Anorexia, Back Pain, Otalgia, Chest Pain, Temporomandibular Joint Pain and Foot Injury   History of Present Illness:  [name] is a 61 y.o. very pleasant male patient who presents with the following:  He has a complex medical history including Bipolar disorder 1 and a history of kidney cancer.  He has several complaints today- 2 pages of typed notes.   He had a catatonic episode in the spring of this year after a medication change was attempted.  [facility].  He notes that during his hospitalization he had loss of appetite.  He lost about 40 lbs.  He is concerned that he has not gained this weight back, and is afraid that he is not getting the nutrients that he needs.  He feels that "my body ate my muscles" and that his hands and genitals have become smaller.  He is afraid that he will not be able to tolerate the cold of the coming winter.  He needs to see a "super Educational psychologist on nutrition."  He is on testosterone shots right now, but he has not had a testosterone level check in some time.    His psychiatrist is Dr. Rhunette Croft.  He is complaint with his current treatment plan.   He is also here today because of pain in "my joints and upper back" for about the last week or so- or longer.  He had a bone density scan about 10 days ago which did demonstrate osteopenia.  He has been told in the past that he had a bad back and "the back of an old man." he has not tried any medications so far for his pain.    He also notes chest congestion for a week.  He has had a non- productive cough.  No fever.    Notes poor vision in dim light or at night  He is concerned about neuropathy in his feet, "I move like I'm 86 instead of 61."  He  is afraid that he is "getting diabetes."  Also concerned about pain- ?TMJ in his jaw when he eats certain hard foods  Also notes that loud noises such as dogs barking bothers his ears, he needs to adjust the volume of his TV frequently.    He did eat this morning- he had breakfast at about 7:30. He had a boost and a sausage biscuit.  He did drive here this morning.    He has a history of CRF- last creat just over 2 in September.  He also had a normal B12 and folate check in response to elevated MCV last month.   Patient Active Problem List  Diagnosis  . MALIGNANT NEOPLASM OF KIDNEY EXCEPT PELVIS  . HYPERLIPIDEMIA  . BIPOLAR DISORDER UNSPECIFIED  . OBSTRUCTIVE SLEEP APNEA  . ASTHMA, CHILDHOOD  . METATARSALGIA  . DYSPNEA ON EXERTION  . Bipolar 1 disorder, mixed  . CKD (chronic kidney disease), stage III  . Bipolar I disorder, most recent episode (or current) depressed  . Constipation  . Altered mental status  . Hypogonadism male    Past Medical History  Diagnosis Date  . Renal cell carcinoma     chemo  and xrt--lost rt kidney  . Childhood asthma   . Sinusitis   . Cellulitis     after left foot surgery  . Osteoarthritis   . OSA on CPAP     NPSG 05-08-07 AHI 63.1/hr,desat t0 80%/loud snoring  . Bipolar 1 disorder   . Hyperlipemia   . Mental disorder   . Depression   . GERD (gastroesophageal reflux disease)   . Hypertension     Past Surgical History  Procedure Date  . Podiatric surgery     left foot toes,   . Bunionectomy   . Nephrectomy 1998    rt., d/t cancer  . Cholecystectomy   . Colon surgery   . Kidney surgery 1998    Rt Kidney Removal     History  Substance Use Topics  . Smoking status: Never Smoker   . Smokeless tobacco: Never Used  . Alcohol Use: Yes     6 beers a uear    Family History  Problem Relation Age of Onset  . Hypertension    . Sleep apnea    . Emphysema    . Prostate cancer    . Arthritis Father     No Known  Allergies  Medication list has been reviewed and updated.  Current Outpatient Prescriptions on File Prior to Visit  Medication Sig Dispense Refill  . buPROPion (WELLBUTRIN XL) 150 MG 24 hr tablet       . [employer]S STOOL SOFTENER 100 MG capsule TAKE 2 TO 3 CAPSULES BY MOUTH DAILY.  60 capsule  2  . divalproex (DEPAKOTE) 250 MG DR tablet Take 250 mg by mouth 3 (three) times daily.      Marland Kitchen docusate sodium (COLACE) 100 MG capsule Take 100 mg by mouth 2 (two) times daily.      Marland Kitchen lithium carbonate (LITHOBID) 300 MG CR tablet Take 1 tablet (300 mg total) by mouth every evening.  20 tablet  0  . LORazepam (ATIVAN) 0.5 MG tablet Take 0.5 mg by mouth every 8 (eight) hours.      . Multiple Vitamin (MULTIVITAMIN) tablet Take 1 tablet by mouth daily. For nutritional supplementation.  30 tablet  0  . niacin (NIASPAN) 500 MG CR tablet Take 1 pill as needed, when you anticipate being cold  30 tablet  11  . temazepam (RESTORIL) 15 MG capsule Take 15 mg by mouth at bedtime as needed.      Marland Kitchen amLODipine (NORVASC) 10 MG tablet TAKE 1 TABLET BY MOUTH DAILY  30 tablet  2  . feeding supplement (ENSURE IMMUNE HEALTH) LIQD Take 237 mLs by mouth 3 (three) times daily with meals.      Marland Kitchen L-Methylfolate (DEPLIN) 7.5 MG TABS       . lamoTRIgine (LAMICTAL) 100 MG tablet Take 100 mg by mouth daily.      . pantoprazole (PROTONIX) 40 MG tablet       . sertraline (ZOLOFT) 100 MG tablet       . testosterone enanthate (DELATESTRYL) 200 MG/ML injection Inject 1 mL (200 mg total) into the muscle every 14 (fourteen) days. For IM use only  5 mL  5  . zolpidem (AMBIEN) 5 MG tablet Take 5 mg by mouth at bedtime as needed.      Marland Kitchen DISCONTD: mirtazapine (REMERON) 30 MG tablet Take by mouth ONE FOURTH tablet at bed time For insomnia.  1 tablet  0  . DISCONTD: pravastatin (PRAVACHOL) 40 MG tablet Take 1 tablet (40 mg total) by mouth  daily. To help lower cholesterol.  30 tablet  0    Review of Systems:  As per HPI- otherwise  negative.  Physical Examination: Filed Vitals:   04/03/12 0922  BP: 126/80  Pulse: 90  Temp: 98 F (36.7 C)  Resp: 16   Filed Vitals:   04/03/12 0922  Height: 6\' 1"  (1.854 m)  Weight: 198 lb (89.812 kg)   Body mass index is 26.12 kg/(m^2). Ideal Body Weight: Weight in (lb) to have BMI = 25: 189.1   GEN: WDWN, NAD, Non-toxic, A & O x 3.  Resting tremor both UE.  Continuous speech.   HEENT: Atraumatic, Normocephalic. Neck supple. No masses, No LAD. Bilateral TM wnl, oropharynx normal.  PEERL,EOMI.   Ears and Nose: No external deformity. CV: RRR, No M/G/R. No JVD. No thrill. No extra heart sounds. PULM: CTA B, no wheezes, crackles, rhonchi. No retractions. No resp. distress. No accessory muscle use. ABD: S, NT, ND EXTR: No c/c/e. Did filament testing to both feet- he did seem to be able to feel the filament, but was concerned that he was supposed to feel a very firm touch and was not sure if he was "really" feeling the filament. NEURO Normal gait.  PSYCH: calm demeanor, but is a challenging historian given the number and complication of his complaints today   Results for orders placed in visit on 04/03/12  POCT CBC      Component Value Range   WBC 7.1  4.6 - 10.2 K/uL   Lymph, poc 1.0  0.6 - 3.4   POC LYMPH PERCENT 14.1  10 - 50 %L   MID (cbc) 0.5  0 - 0.9   POC MID % 7.6  0 - 12 %M   POC Granulocyte 5.6  2 - 6.9   Granulocyte percent 78.3  37 - 80 %G   RBC 4.82  4.69 - 6.13 M/uL   Hemoglobin 15.6  14.1 - 18.1 g/dL   HCT, POC 16.1  09.6 - 53.7 %   MCV 102.3 (*) 80 - 97 fL   MCH, POC 32.4 (*) 27 - 31.2 pg   MCHC 31.6 (*) 31.8 - 35.4 g/dL   RDW, POC 04.5     Platelet Count, POC 177  142 - 424 K/uL   MPV 7.9  0 - 99.8 fL  GLUCOSE, POCT (MANUAL RESULT ENTRY)      Component Value Range   POC Glucose 58 (*) 70 - 99 mg/dl    He was given crackers and juice, and his glucose was over 100 prior to leaving clinic today   [facility] reading (PRIMARY) by  Dr. Patsy Lager. CXR: no  infiltrate.  Significant degenerative changes in the thoracic spine Lumbar spine: history of nephrectomy.  Moderate degenerative changes generally, severe at L2- L3  CHEST - 2 VIEW  Comparison: 11/12/2011  Findings: The lungs are clear without evidence of edema, infiltrate, nodule or pleural effusions. The heart size is within normal limits. There are stable degenerative changes of the visualized spine.  IMPRESSION: No active disease.   LUMBAR SPINE - COMPLETE 4+ VIEW  Comparison: CT dated 02/21/2012.  Findings: Moderately advanced spondylosis present of the lumbar spine. There is severe disc space narrowing with associated vacuum disc at the L2-3 level. Mild disc space narrowing is present at the L3-4 level. Moderate disc space narrowing is present at the L4- 5 and L5-S1 levels. There is no evidence of significant listhesis or subluxation. No fractures or focal bony lesions are identified.  The patient is status post right nephrectomy with multiple clips present. Visualized sacrum and sacroiliac joints are unremarkable.  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   Assessment and Plan: 1. Fatigue  POCT CBC, POCT glucose (manual entry), Testosterone  2. Back pain  DG Lumbar Spine Complete  3. Cough  DG Chest 2 View  4. Hypogonadism male  Testosterone   Jyles has many complaints today, which I have addressed to the best of my ability.  He is convinced that his symptoms are due to malnutrition which I do not think is true.  I have explained that true malnutrition is rare in the [place] when people have the means to access adequate food.  We do need to check his testosterone level as this has not been done.  This could explain some of his malaise and his complaint that he "is not able to work out enough to put on muscle."  He is actually overweight currently, but still complains that he has to force himself to eat anything except for junk food.    He does  have severe degenerative changes in his back- explained that these are the reason for his back pain.  He has not tried any medications yet and I certainly hesitate to try a narcotic medication.  He will start with tylenol (aviod NSAIDs due to his kidney problems) and let me know if it helps.    Reassured that his CXR looks fine, I think he has a viral URI which may be helped by fluids and rest.  Let me know if worse.  Performed audiometry per his request which showed only minor hearing loss at higher frequencies.  I cannot explain why he notes that sounds are too loud, but offered to have him see ENT if he likes.    [place] does have a significant tremor and may have peripheral neuropathy.  It is unclear if he completed his evaluation with [facility].  I will pull his paper chart and look into this and get back to him.  He may want to seek a second opinion  I spent over an hour face to face with Leeroy Bock, MD

## 2012-04-03 NOTE — Patient Instructions (Addendum)
Try using tylenol first for your back pain.  Follow the package instructions.   I will be in touch regarding your testosterone results.  We will make any needed adjustments to your testosterone plans.    If you would like to see another neurologist let me know.

## 2012-04-03 NOTE — Telephone Encounter (Signed)
Saw Dr. Patsy Lager today, forgot to ask if needs to schedule a colonoscopy at this time" with everything else that he has going on".   If Colonoscopy needed, please put in referral.   [phone] call pt.

## 2012-04-04 ENCOUNTER — Telehealth: Payer: Self-pay

## 2012-04-04 NOTE — Telephone Encounter (Signed)
Clld pt - advsd of physician's notation that his colonoscopy was 2008 and that he could either schedule to have another one or wait. Pt was also advsd that we will need a ROA completed to forward to [facility]. The form will be left upfront for him to comple. Pt states that he will try to come by today or tomorrow to complete ROA form.

## 2012-04-04 NOTE — Telephone Encounter (Signed)
Please give him a call.  His last colonoscopy was performed in 2008 and he was asked to follow- up in 5 to 10 years.  If he would like to go ahead and get a repeat scheduled he should call Dr. Lennie Odor office  Address: [address] [place], [place] [address]  [phone]   However, he can wait a while longer if he likes.  It has just now been 5 years since his last study.   Also, I am going to have to request records from his neurologist as we do not appear to have these in his chart.  I will request these and get back to him- let me know if he has not heard from me in 10 days

## 2012-04-07 ENCOUNTER — Ambulatory Visit (INDEPENDENT_AMBULATORY_CARE_PROVIDER_SITE_OTHER): Payer: 59 | Admitting: *Deleted

## 2012-04-07 DIAGNOSIS — R7989 Other specified abnormal findings of blood chemistry: Secondary | ICD-10-CM

## 2012-04-07 DIAGNOSIS — E291 Testicular hypofunction: Secondary | ICD-10-CM

## 2012-04-07 MED ORDER — TESTOSTERONE ENANTHATE 200 MG/ML IM SOLN
200.0000 mg | INTRAMUSCULAR | Status: DC
  Administered 2012-04-07: 200 mg via INTRAMUSCULAR

## 2012-04-07 NOTE — Progress Notes (Signed)
  Subjective:    Patient ID: [name], male    DOB: 04/24/1951, 61 y.o.   MRN: [id number]  HPI    Review of Systems     Objective:   Physical Exam        Assessment & Plan:  Pt here to get testosterone injection only.

## 2012-04-08 ENCOUNTER — Encounter: Payer: Self-pay | Admitting: Family Medicine

## 2012-04-09 ENCOUNTER — Encounter: Payer: Self-pay | Admitting: Family Medicine

## 2012-04-10 ENCOUNTER — Encounter: Payer: Self-pay | Admitting: Internal Medicine

## 2012-04-10 ENCOUNTER — Ambulatory Visit (INDEPENDENT_AMBULATORY_CARE_PROVIDER_SITE_OTHER): Payer: 59 | Admitting: Internal Medicine

## 2012-04-10 VITALS — BP 108/70 | HR 74 | Ht 74.0 in | Wt 200.8 lb

## 2012-04-10 DIAGNOSIS — G4733 Obstructive sleep apnea (adult) (pediatric): Secondary | ICD-10-CM

## 2012-04-10 NOTE — Progress Notes (Signed)
04/05/11-61 year old male never smoker followed for obstructive sleep apnea complicated by history of dyspnea on exertion, renal cell cancer/chemotherapy, allergic rhinitis (Dr Samson Callas), bipolar disorder, essential tremor. Last here 05/14/2010 CPAP was changed to 15 CWP which seemed uncomfortably high for him. He reduced it himself to 14 CWP which is more comfortable and seems to be effective. He is using it every night. Bothersome dry mouth despite Biotene mouthwash. He had felt tired during the day but has had several medication changes and now feels better. Renal function required that lithium be stopped. His Depakote was increased and his Paxil is being tapered off by Dr.Plovsky.  04/10/12- 61 year old male never smoker followed for obstructive sleep apnea complicated by history of dyspnea on exertion, renal cell cancer/chemotherapy, allergic rhinitis (Dr  Callas), bipolar disorder, essential tremor. Had to get new machine as old one broke-really doesnt like the new one; pressure is okay. Wears CPAP 14/ [employer],  8 hours at night. Ramp not working.  Has 1 kidney. Tried off lithium. Became manic/ Dr Donell Beers, lost 40 lbs, hosp [facility].  ROS-see HPI Constitutional:   No-   weight loss, night sweats, fevers, chills, fatigue, lassitude. HEENT:   No-  headaches, difficulty swallowing, tooth/dental problems, sore throat,       No-  sneezing, itching, ear ache, nasal congestion, post nasal drip,  CV:  No-   chest pain, orthopnea, PND, swelling in lower extremities, anasarca, dizziness, palpitations Resp: No- recent problem with shortness of breath with exertion or at rest.              No-   productive cough,  No non-productive cough,  No- coughing up of blood.              No-   change in color of mucus.  No- wheezing.   Skin: No-   rash or lesions. GI:  No-   heartburn, indigestion, abdominal pain, nausea, vomiting,  GU:  MS:  No-   joint pain or swelling.   Neuro-     nothing  unusual Psych:  No-acute change in mood or affect. No acute depression or anxiety.  No memory loss.  OBJ General- Alert, Oriented, Affect-appropriate, Distress- none acute; trim. Skin- rash-none, lesions- none, excoriation- none Lymphadenopathy- none Head- atraumatic            Eyes- Gross vision intact, PERRLA, conjunctivae clear secretions            Ears- Hearing, canals-normal            Nose- Clear, no-Septal dev, mucus, polyps, erosion, perforation             Throat- Mallampati III , mucosa clear , drainage- none, tonsils- atrophic Neck- flexible , trachea midline, no stridor , thyroid nl, carotid no bruit Chest - symmetrical excursion , unlabored           Heart/CV- RRR , no murmur , no gallop  , no rub, nl s1 s2                           - JVD- none , edema 1+, stasis changes- none, varices- none           Lung- clear to P&A, wheeze- none, cough- none , dullness-none, rub- none           Chest wall-  Abd-  Br/ Gen/ Rectal- Not done, not indicated Extrem- cyanosis- none, clubbing, none, atrophy- none, strength- nl Neuro- +significant  resting and intentional tremor

## 2012-04-10 NOTE — Patient Instructions (Addendum)
Continue CPAP 14  Work with Christoper Allegra about the ramp function of your machine.

## 2012-04-14 ENCOUNTER — Telehealth: Payer: Self-pay

## 2012-04-14 NOTE — Telephone Encounter (Signed)
PT WAS CALLING TO SEE IF RAMONA FOUND OUT ABOUT HIS TESTOSTERONE SHOTS AND THE EXPIRATION DATE.  ALSO HE HAS A REFERRAL IN [place] ON Friday AND HE WANTS TO KNOW IF DR. DAUB THINKS HE SHOULD STILL GO TO THIS. [phone]

## 2012-04-14 NOTE — Telephone Encounter (Signed)
The date on the bottle is 09/2013 I have advised him okay to use this. I have also advised him  Dr Cleta Alberts does want him to go for the referral.

## 2012-04-19 ENCOUNTER — Ambulatory Visit (INDEPENDENT_AMBULATORY_CARE_PROVIDER_SITE_OTHER): Payer: 59 | Admitting: Family Medicine

## 2012-04-19 VITALS — BP 145/90 | HR 71 | Temp 98.2°F | Resp 16 | Ht 72.0 in | Wt 200.0 lb

## 2012-04-19 DIAGNOSIS — L739 Follicular disorder, unspecified: Secondary | ICD-10-CM

## 2012-04-19 DIAGNOSIS — R300 Dysuria: Secondary | ICD-10-CM

## 2012-04-19 DIAGNOSIS — R0989 Other specified symptoms and signs involving the circulatory and respiratory systems: Secondary | ICD-10-CM

## 2012-04-19 DIAGNOSIS — M7989 Other specified soft tissue disorders: Secondary | ICD-10-CM

## 2012-04-19 DIAGNOSIS — L738 Other specified follicular disorders: Secondary | ICD-10-CM

## 2012-04-19 DIAGNOSIS — R350 Frequency of micturition: Secondary | ICD-10-CM

## 2012-04-19 LAB — POCT UA - MICROSCOPIC ONLY: Mucus, UA: POSITIVE

## 2012-04-19 LAB — POCT URINALYSIS DIPSTICK
Blood, UA: NEGATIVE
Glucose, UA: NEGATIVE
Spec Grav, UA: 1.025

## 2012-04-19 NOTE — Progress Notes (Signed)
[address]   [place], [place]  [address]   [phone]  Subjective:    Patient ID: [name], male    DOB: 12/11/1950, 61 y.o.   MRN: [id number]  HPIThis 61 y.o. male presents for evaluation of the following:  1.  Abrasions:  Fell at home. L hand abrasion, R elbow.  Last Tetanus vaccine 06/04/11. Occurred at 2:30pm.  No cleaning performed.  Good amount of bleeding.  Wants various abrasions reviewed by MD.    2.  Dysuria:  Onset two weeks ago.  No hematuria.  Nocturia x 3-4.  Nocturia baseline 2-3.  No testicular pain or swelling; having testicular atrophy.  History of UTI in past.  Not currently sexually active.  No new sexual partners.  Married.  History of prostatitis in 1980s.  Not dysuria every urination; not severe.      3.  Leg swelling: chronic intermittent issue for patient; concerned regarding etiology; denies CP/palp but does report DOE.    4.  DOE: last stress test 04/2008 for non-specific chest pain; was admitted to [facility] at that time for chest pain.    5.  Rash upper back. Present for several weeks; would like evaluated; no itching; cannot see rash well due to location; no injury.  No pain.  No fever/chills/sweats.  No new soaps, detergents, shampoos.  6. Malnutrition:  Very worried about malnutrition; wants to know what he should be eating and what vitamins he should be taking daily.    PCP: Daub   Review of Systems  Constitutional: Negative for fever, chills, diaphoresis and fatigue.  Respiratory: Positive for shortness of breath. Negative for wheezing and stridor.   Cardiovascular: Positive for leg swelling. Negative for chest pain and palpitations.  Genitourinary: Positive for dysuria. Negative for urgency, frequency, hematuria, flank pain, decreased urine volume, discharge, penile swelling, scrotal swelling, difficulty urinating, genital sores, penile pain and testicular pain.  Skin: Positive for color change, rash and wound.  Hematological: Negative for  adenopathy. Does not bruise/bleed easily.        Past Medical History  Diagnosis Date  . Renal cell carcinoma     chemo and xrt--lost rt kidney  . Childhood asthma   . Sinusitis   . Cellulitis     after left foot surgery  . Osteoarthritis   . OSA on CPAP     NPSG 05-08-07 AHI 63.1/hr,desat t0 80%/loud snoring  . Bipolar 1 disorder   . Hyperlipemia   . Mental disorder   . Depression   . GERD (gastroesophageal reflux disease)   . Hypertension     Past Surgical History  Procedure Date  . Foot surgery     left foot toes,   . Bunionectomy   . Nephrectomy 1998    rt., d/t cancer  . Cholecystectomy   . Colon surgery   . Kidney surgery 1998    Rt Kidney Removal     Prior to Admission medications   Medication Sig Start Date End Date Taking? Authorizing Provider  lithium carbonate (LITHOBID) 300 MG CR tablet Take 1 tablet (300 mg total) by mouth every evening. 09/09/11  Yes Clanford L Johnson, MD  LORazepam (ATIVAN) 0.5 MG tablet Take 1 mg by mouth at bedtime.    Yes Historical Provider, MD  temazepam (RESTORIL) 15 MG capsule Take 15-30 mg by mouth at bedtime as needed. For sleep.   Yes Historical Provider, MD  amLODipine (NORVASC) 10 MG tablet Take 10 mg by mouth daily.  Historical Provider, MD  amLODipine (NORVASC) 10 MG tablet TAKE 1 TABLET BY MOUTH EVERY DAY 07/12/12   Sondra Barges, PA-C  aspirin EC 81 MG tablet Take 1 tablet (81 mg total) by mouth daily. 06/18/12   Laurey Morale, MD  buPROPion (WELLBUTRIN SR) 200 MG 12 hr tablet Take 200 mg by mouth 2 (two) times daily.    Historical Provider, MD  divalproex (DEPAKOTE ER) 250 MG 24 hr tablet Take 750 mg by mouth daily.    Historical Provider, MD  docusate sodium (COLACE) 100 MG capsule Take 100-200 mg by mouth 2 (two) times daily.    Historical Provider, MD  lactose free nutrition (BOOST) LIQD Take 237 mLs by mouth daily.    Historical Provider, MD  Multiple Vitamin (MULTIVITAMIN WITH MINERALS) TABS Take 1 tablet by mouth  daily.    Historical Provider, MD  ondansetron (ZOFRAN) 4 MG tablet Take 1 tablet (4 mg total) by mouth every 6 (six) hours. 07/05/12   Teressa Lower, NP  pantoprazole (PROTONIX) 40 MG tablet Take 40 mg by mouth daily as needed. For reflux. 03/19/12   Historical Provider, MD  primidone (MYSOLINE) 50 MG tablet Take 250 mg by mouth at bedtime.     Historical Provider, MD  testosterone enanthate (DELATESTRYL) 200 MG/ML injection Inject 1 mL (200 mg total) into the muscle every 14 (fourteen) days. For IM use only 07/05/12   Collene Gobble, MD    No Known Allergies  History   Social History  . Marital Status: Married    Spouse Name: N/A    Number of Children: 0  . Years of Education: N/A   Occupational History  . [employer]-product developer    Social History Main Topics  . Smoking status: Never Smoker   . Smokeless tobacco: Never Used  . Alcohol Use: No  . Drug Use: No  . Sexually Active: Not Currently   Other Topics Concern  . Not on file   Social History Narrative  . No narrative on file    Family History  Problem Relation Age of Onset  . Hypertension    . Sleep apnea    . Emphysema    . Prostate cancer    . Arthritis Father     Objective:   Physical Exam  Nursing note and vitals reviewed. Constitutional: He is oriented to person, place, and time. He appears well-developed and well-nourished. No distress.  HENT:  Head: Normocephalic and atraumatic.  Right Ear: External ear normal.  Left Ear: External ear normal.  Nose: Nose normal.  Mouth/Throat: Oropharynx is clear and moist.  Eyes: Conjunctivae normal and EOM are normal. Pupils are equal, round, and reactive to light.  Neck: Normal range of motion. Neck supple. No JVD present. No thyromegaly present.  Cardiovascular: Normal rate, regular rhythm, normal heart sounds and intact distal pulses.  Exam reveals no gallop and no friction rub.   No murmur heard. Pulmonary/Chest: Effort normal and breath sounds normal.  No respiratory distress. He has no wheezes. He has no rales.  Abdominal: Soft. Bowel sounds are normal. He exhibits no distension. There is no tenderness. There is no rebound and no guarding.  Lymphadenopathy:    He has no cervical adenopathy.  Neurological: He is alert and oriented to person, place, and time. No cranial nerve deficit. He exhibits normal muscle tone. Coordination normal.  Skin: He is not diaphoretic.       L hand: multiple superficial abrasions with good approximation and good  hemostasis.  Wound cleansed in office to remove debris.   R elbow: superficial laceration/abrasion cleansed in office to remove debris. Upper back: various areas of pustules isolated. No associated vesicles.  Psychiatric: He has a normal mood and affect. His behavior is normal. Judgment and thought content normal.    EKG:  NSR; no ST changes.     Results for orders placed in visit on 04/19/12  POCT URINALYSIS DIPSTICK      Component Value Range   Color, UA yellow     Clarity, UA clear     Glucose, UA neg     Bilirubin, UA neg     Ketones, UA trace     Spec Grav, UA 1.025     Blood, UA neg     pH, UA 5.5     Protein, UA neg     Urobilinogen, UA 0.2     Nitrite, UA neg     Leukocytes, UA Negative    POCT UA - MICROSCOPIC ONLY      Component Value Range   WBC, Ur, HPF, POC 9-13     RBC, urine, microscopic 0-1     Bacteria, U Microscopic 1+     Mucus, UA pos     Epithelial cells, urine per micros 0-1     Crystals, Ur, HPF, POC neg     Casts, Ur, LPF, POC neg     Yeast, UA neg      Assessment & Plan:   1. Urine frequency  POCT urinalysis dipstick, POCT UA - Microscopic Only, Urine culture  2. Dysuria  DISCONTINUED: doxycycline (VIBRAMYCIN) 100 MG capsule  3. Folliculitis  DISCONTINUED: doxycycline (VIBRAMYCIN) 100 MG capsule  4. Dyspnea on exertion    5. Leg swelling    6. Abrasions  1.  Dysuria with urinary frequency: New. Send urine culture. Treat empirically with Doxycycline bid  which should also treat folliculitis of upper back. 2.  Folliculitis:  New.  Upper back. Rx for Doxycycline provided. 3.  DOE:  New to this provider.  S/p CXR in past month; EKG normal.  Recommend stress testing and 2D-echo to rule out cardiac pathology; pt to consider and contact office if desires to schedule. 4.  Leg swelling: Chronic issue for patient for past several months; s/p CXR in past month; s/p EKG in office; recommend 2D-echo if worsens or persists to evaluate for CHF, valvular pathology; obtain labs. Extensive labs completed in past six months.  5. Abrasions Upper Extremities:  New.  Tetanus UTD.  S/p cleansing of wounds during visit; local wound care reviewed in detail. No suturing warranted; all abrasions superficial.    Meds ordered this encounter  Medications  . DISCONTD: doxycycline (VIBRAMYCIN) 100 MG capsule    Sig: Take 1 capsule (100 mg total) by mouth 2 (two) times daily.    Dispense:  30 capsule    Refill:  0

## 2012-04-20 ENCOUNTER — Telehealth: Payer: Self-pay

## 2012-04-20 ENCOUNTER — Other Ambulatory Visit: Payer: Self-pay | Admitting: Physician Assistant

## 2012-04-20 NOTE — Telephone Encounter (Signed)
PT WOULD LIKE TO WOULD LIKE TO SPEAK WITH SOMEONE REGARDING WHAT MEDICATIONS WERE CALLED IN PT STATES THAT THE DR STATED THAT "DOXYCILLIN"  WOULD BE SENT OVER TO THE PHARMACY ALONG WITH THE IT THE OTHERS.  BEST# [phone]

## 2012-04-21 ENCOUNTER — Other Ambulatory Visit: Payer: Self-pay | Admitting: Emergency Medicine

## 2012-04-21 ENCOUNTER — Ambulatory Visit (INDEPENDENT_AMBULATORY_CARE_PROVIDER_SITE_OTHER): Payer: 59 | Admitting: Family Medicine

## 2012-04-21 DIAGNOSIS — E291 Testicular hypofunction: Secondary | ICD-10-CM

## 2012-04-21 MED ORDER — TESTOSTERONE ENANTHATE 200 MG/ML IM SOLN: 200.0000 mg | Freq: Once | INTRAMUSCULAR | Status: DC

## 2012-04-21 MED ORDER — TESTOSTERONE ENANTHATE 200 MG/ML IM SOLN
200.0000 mg | Freq: Once | INTRAMUSCULAR | Status: AC
  Administered 2012-04-21: 200 mg via INTRAMUSCULAR

## 2012-04-21 NOTE — Progress Notes (Signed)
  Subjective:    Patient ID: [name], male    DOB: 1950-11-03, 61 y.o.   MRN: [id number]  HPI    Review of Systems     Objective:   Physical Exam       pt here for testosterone inj only Assessment & Plan:

## 2012-04-21 NOTE — Telephone Encounter (Signed)
Pt  Cancelled his colonoscopy due to injury, rescheduled next week.   Wants doxcyciline - talked with Dr. Katrinka Blazing

## 2012-04-22 MED ORDER — DOXYCYCLINE HYCLATE 100 MG PO CAPS: 100.0000 mg | ORAL_CAPSULE | Freq: Two times a day (BID) | ORAL | Status: DC

## 2012-04-22 NOTE — Telephone Encounter (Signed)
Will ask Dr Cleta Alberts to advise.

## 2012-04-22 NOTE — Telephone Encounter (Signed)
Oops, not Dr Cleta Alberts, Dr Katrinka Blazing have called patient to advise.

## 2012-04-23 NOTE — Assessment & Plan Note (Signed)
Good compliance and control with CPAP. He needs to talk with [facility] or about function or replacement of his machine in some aspects don't work.

## 2012-04-23 NOTE — Assessment & Plan Note (Signed)
Mild peripheral edema may be from Norvasc or his renal insufficiency

## 2012-04-26 ENCOUNTER — Encounter: Payer: Self-pay | Admitting: Family Medicine

## 2012-04-27 ENCOUNTER — Encounter: Payer: Self-pay | Admitting: Family Medicine

## 2012-04-27 ENCOUNTER — Other Ambulatory Visit: Payer: Self-pay | Admitting: Physician Assistant

## 2012-04-29 ENCOUNTER — Encounter: Payer: Self-pay | Admitting: Radiology

## 2012-04-29 NOTE — Telephone Encounter (Signed)
Please check the records but I believe the patient just had a colonoscopy in the last week.

## 2012-04-29 NOTE — Telephone Encounter (Signed)
Patient states he feels cold. He has seen the endocrinologist who gave him niacin, but this did not help. Patient wants to know what Dr Cleta Alberts can recommend for his feeling cold all the time. Patient states he has tried adding layers for warmth, but is still feeling cold. He is asking if it could be cardiovacular, his feet are extremely cold. Patient also states he had colonoscopy done yesterday. They did do a biopsy on one area.

## 2012-04-29 NOTE — Telephone Encounter (Signed)
[name] would like to have [name] return his call after 1 pm today.  He has several issues to discuss.  [phone].

## 2012-04-29 NOTE — Telephone Encounter (Signed)
Yes, the colonoscopy was yesterday, he states there was a biopsy done, but his concern continues to be directed towards his feeling cold all the time. He has seen the endocrinologist as you reccommended but he is not happy with what he was told. Please advise if you can help with his complaints of feeling cold. He states this started shortly after he finished chemotherapy but has gotten worse recently.   He states the endocrinologist told him to wear more clothes and take Niacin.

## 2012-04-30 NOTE — Telephone Encounter (Signed)
Please call patient told him to make an appointment at 104 and I will be happy to see him and discuss any other options we can do to get him feeling better.

## 2012-04-30 NOTE — Telephone Encounter (Signed)
Called patient to advise  °

## 2012-05-05 ENCOUNTER — Other Ambulatory Visit: Payer: Self-pay | Admitting: Emergency Medicine

## 2012-05-05 ENCOUNTER — Ambulatory Visit (INDEPENDENT_AMBULATORY_CARE_PROVIDER_SITE_OTHER): Payer: 59 | Admitting: Family Medicine

## 2012-05-05 DIAGNOSIS — M545 Low back pain: Secondary | ICD-10-CM

## 2012-05-05 DIAGNOSIS — E291 Testicular hypofunction: Secondary | ICD-10-CM

## 2012-05-05 DIAGNOSIS — E236 Other disorders of pituitary gland: Secondary | ICD-10-CM

## 2012-05-05 MED ORDER — TESTOSTERONE ENANTHATE 200 MG/ML IM SOLN
2.0000 mg | Freq: Once | INTRAMUSCULAR | Status: AC
  Administered 2012-05-05: 2 mg via INTRAMUSCULAR

## 2012-05-15 ENCOUNTER — Telehealth: Payer: Self-pay

## 2012-05-15 NOTE — Telephone Encounter (Signed)
[facility]AY[facility] THAT ONCE A VIAL OF TESTOSTERONE HAS BEEN PUNCTURED,IT ONLY LASTS FOR 28 DAYS???   PLEASE CALL BACK RE THIS ISSUE  BEST PHONE [phone]  [facility]Y [facility]

## 2012-05-15 NOTE — Telephone Encounter (Signed)
I have spoken to him about this a couple times already. Will call him again on Monday. [name]

## 2012-05-18 NOTE — Telephone Encounter (Signed)
Called patient back about this to advise.

## 2012-05-19 ENCOUNTER — Telehealth: Payer: Self-pay

## 2012-05-19 ENCOUNTER — Ambulatory Visit (INDEPENDENT_AMBULATORY_CARE_PROVIDER_SITE_OTHER): Payer: 59 | Admitting: Emergency Medicine

## 2012-05-19 ENCOUNTER — Encounter: Payer: Self-pay | Admitting: Emergency Medicine

## 2012-05-19 VITALS — BP 140/70 | HR 69 | Temp 98.0°F | Resp 16 | Ht 73.0 in | Wt 208.2 lb

## 2012-05-19 DIAGNOSIS — R0609 Other forms of dyspnea: Secondary | ICD-10-CM

## 2012-05-19 NOTE — Telephone Encounter (Signed)
Dr Cleta Alberts advised.

## 2012-05-19 NOTE — Progress Notes (Signed)
  Subjective:    Patient ID: [name], male    DOB: [birth year], 61 y.o.   MRN: [id number]  HPI patient enters mainly with a complaint of dyspnea on exertion. He states he gets extremely fatigued if he tries to do any type of physical activity or walking. He has no history of lung disease never been a smoker he had a chest x-ray done recently and it was normal. He has no history of cardiac disease and underwent a cardiac stress test in 2000 and did well on that. Denies true chest pain when he exerts himself. He has no family history of coronary disease the    Review of Systems     Objective:   Physical Exam patient is alert and cooperative. He has a significant resting tremor which is worse on the right. He has some scaling around the scalp. His neck is supple. Chest exam is clear to auscultation and percussion with no wheezing noted cardiac exam reveals no murmurs rubs or gallops. The abdomen is soft without tenderness there is a large scar across the upper abdomen extremities are without edema. EKG done was normal sinus rhythm with no acute changes pulmonary function tests were obtained and showed forced vital capacity and FEV1 both at 70%. This would be consistent with mild restrictive lung disease.        Assessment & Plan:  I think the first step would be to have her see the cardiologist and be sure he does not have occult coronary disease. If this is negative he sees Dr. Jetty Duhamel for his obstructive sleep apnea and we did have Dr. Tacey Heap will get him and see if he has lung disease that is contributing to his poor exercise tolerance.

## 2012-05-19 NOTE — Telephone Encounter (Signed)
Please call [name] and ask if he was to call different pharmacies and see where he might be able to get his prescription. He can also ask him if he would like me to refer him to urology to manage his low testosterone that may be easier for him to

## 2012-05-19 NOTE — Telephone Encounter (Signed)
Pt wanted to let dr Cleta Alberts know that his pharmacy states there is a back order of testosterone and they cant get it until the middle of January. Pt requested we relay this info to the doctor

## 2012-05-19 NOTE — Telephone Encounter (Signed)
Pt called re: his testosterone issues and preservatives in the testosterone.  Best number [phone]

## 2012-05-21 ENCOUNTER — Ambulatory Visit (INDEPENDENT_AMBULATORY_CARE_PROVIDER_SITE_OTHER): Payer: 59 | Admitting: *Deleted

## 2012-05-21 DIAGNOSIS — E291 Testicular hypofunction: Secondary | ICD-10-CM

## 2012-05-21 MED ORDER — TESTOSTERONE CYPIONATE 200 MG/ML IM SOLN
200.0000 mg | Freq: Once | INTRAMUSCULAR | Status: AC
  Administered 2012-05-21: 200 mg via INTRAMUSCULAR

## 2012-05-21 NOTE — Progress Notes (Signed)
  Subjective:    Patient ID: [name], male    DOB: 1950/07/16, 61 y.o.   MRN: [id number]  HPI    Review of Systems     Objective:   Physical Exam        Assessment & Plan:  Pt here for testosterone injection only.

## 2012-05-22 NOTE — Telephone Encounter (Signed)
Called left message for him to call me back.

## 2012-05-25 ENCOUNTER — Other Ambulatory Visit: Payer: Self-pay | Admitting: Emergency Medicine

## 2012-05-25 ENCOUNTER — Telehealth: Payer: Self-pay

## 2012-05-25 DIAGNOSIS — M2550 Pain in unspecified joint: Secondary | ICD-10-CM

## 2012-05-25 NOTE — Telephone Encounter (Signed)
PT STATES HE WOULD LIKE TO BE REFERRED TO SEE SOMEONE ABOUT HIS [facility] AND HIP PLEASE CALL [phone] WHEN REFERRAL IS MADE

## 2012-06-03 ENCOUNTER — Telehealth: Payer: Self-pay | Admitting: Radiology

## 2012-06-03 DIAGNOSIS — R251 Tremor, unspecified: Secondary | ICD-10-CM

## 2012-06-03 DIAGNOSIS — M6281 Muscle weakness (generalized): Secondary | ICD-10-CM

## 2012-06-03 NOTE — Telephone Encounter (Signed)
Dr Cleta Alberts spoke to Dr Donell Beers about patient. He has tremor, unstable gait and increasing muscle weakness. Patient needs to be referred to neuro rehab on [address]. The phone # there is [phone] I have called they will send me a referral form. I will advise patient we will be setting this up for him, and will have Dr Cleta Alberts sign the form when he is back in the office.

## 2012-06-04 ENCOUNTER — Telehealth: Payer: Self-pay | Admitting: Family Medicine

## 2012-06-04 ENCOUNTER — Ambulatory Visit (INDEPENDENT_AMBULATORY_CARE_PROVIDER_SITE_OTHER): Payer: 59 | Admitting: Family Medicine

## 2012-06-04 DIAGNOSIS — E291 Testicular hypofunction: Secondary | ICD-10-CM

## 2012-06-04 MED ORDER — TESTOSTERONE ENANTHATE 200 MG/ML IM SOLN
200.0000 mg | INTRAMUSCULAR | Status: DC
  Administered 2012-06-04: 200 mg via INTRAMUSCULAR

## 2012-06-04 NOTE — Telephone Encounter (Signed)
[employer] came in today to get his Testosterone injection and will need another prescription for next injection due in two weeks. Would like [facility] [address]

## 2012-06-05 ENCOUNTER — Telehealth: Payer: Self-pay | Admitting: Radiology

## 2012-06-05 NOTE — Telephone Encounter (Signed)
Called Neuro rehab again for order form they state they are faxing now.

## 2012-06-05 NOTE — Telephone Encounter (Signed)
His okay to refill his testosterone enanthate. He gets 1 cc  Every  2 weeks

## 2012-06-05 NOTE — Telephone Encounter (Signed)
Form faxed. Will call patient, called yesterday no answer.

## 2012-06-06 MED ORDER — TESTOSTERONE ENANTHATE 200 MG/ML IM SOLN: 200.0000 mg | INTRAMUSCULAR | Status: DC

## 2012-06-06 NOTE — Telephone Encounter (Signed)
rx called in and pt notified. 

## 2012-06-08 ENCOUNTER — Encounter: Payer: Self-pay | Admitting: *Deleted

## 2012-06-15 ENCOUNTER — Telehealth: Payer: Self-pay

## 2012-06-15 NOTE — Telephone Encounter (Signed)
Dr.Daub, Pt would like to speak with you regarding his disability papers, wants to know if he should see a orthopedic because cannot comfortably walk. Best# [phone]

## 2012-06-16 NOTE — Telephone Encounter (Signed)
Please call [name] and tell him I would be happy to refer him to orthopedics. If he is willing make the appointment with [facility]. Tell him I will call him later this evening to talk about his disability papers

## 2012-06-16 NOTE — Telephone Encounter (Signed)
Left message for him to advise, also need to know if he has been contacted about the Neuro PT.

## 2012-06-18 ENCOUNTER — Encounter: Payer: Self-pay | Admitting: Cardiology

## 2012-06-18 ENCOUNTER — Ambulatory Visit (INDEPENDENT_AMBULATORY_CARE_PROVIDER_SITE_OTHER): Payer: 59 | Admitting: Cardiology

## 2012-06-18 ENCOUNTER — Ambulatory Visit (INDEPENDENT_AMBULATORY_CARE_PROVIDER_SITE_OTHER): Payer: 59 | Admitting: Family Medicine

## 2012-06-18 VITALS — BP 120/68 | HR 68 | Ht 73.0 in | Wt 207.0 lb

## 2012-06-18 DIAGNOSIS — R079 Chest pain, unspecified: Secondary | ICD-10-CM

## 2012-06-18 DIAGNOSIS — E785 Hyperlipidemia, unspecified: Secondary | ICD-10-CM

## 2012-06-18 DIAGNOSIS — E291 Testicular hypofunction: Secondary | ICD-10-CM

## 2012-06-18 DIAGNOSIS — R0609 Other forms of dyspnea: Secondary | ICD-10-CM

## 2012-06-18 MED ORDER — TESTOSTERONE ENANTHATE 200 MG/ML IM SOLN
200.0000 mg | Freq: Once | INTRAMUSCULAR | Status: AC
  Administered 2012-06-18: 200 mg via INTRAMUSCULAR

## 2012-06-18 NOTE — Patient Instructions (Addendum)
Take aspirin 81mg  daily. Take this with food.   Your physician has requested that you have an echocardiogram. Echocardiography is a painless test that uses sound waves to create images of your heart. It provides your doctor with information about the size and shape of your heart and how well your heart's chambers and valves are working. This procedure takes approximately one hour. There are no restrictions for this procedure.  Your physician has requested that you have a lexiscan myoview. For further information please visit [email or web address] Please follow instruction sheet, as given.  Your physician recommends that you return for a FASTING lipid profile /BNP.   Your physician recommends that you schedule a follow-up appointment in: 3 weeks with Dr Shirlee Latch.

## 2012-06-18 NOTE — Progress Notes (Signed)
Patient ID: [name], male   DOB: 06/15/1951, 62 y.o.   MRN: [id number] PCP: Dr. Cleta Alberts  62 yo with history of bipolar disorder, essential tremor, HTN, CKD, and hyperlipidemia presents for cardiology evaluation because of exertional dyspnea.  He has had 2 prior stress tests, both normal per his report.  Most recent stress test was 5-6 years ago.  He has had renal cell carcinoma with right nephrectomy and now has creatinine around 2 at baseline.  Earlier this year, he had trouble with his bipolar disorder (manic phase after stopping lithium).  He lost a lot of weight and was relatively inactive.  Since this summer, he has noted exertional dyspnea.  He is very short of breath walking up a flight of steps.  He is short of breath if he carries a heavy load.  Exercise is limited by bilateral hip and ankle pain.  He can walk about 1/2 mile on flat ground.  He is more limited by joint pain than dyspnea when he does this.  No orthopnea/PND.  He gets occasional central chest discomfort that is not related to exertion, no particular trigger.  He denies claudication, syncope, palpitations.    ECG: NSR, normal (from PCP's office in 11/13).   Labs (5/13): LDL 171, HDL 40 Labs (9/13): TSH normal, K 5.1, creatinine 2.08  PMH: 1. OSA: CPAP 2. Renal cell carcinoma: right nephrectomy in 1998, cancer returned and he later had chemotherapy and XRT.  Now in remission.  3. Allergic rhinitis 4. Bipolar disorder: on lithium. 5. Essential tremor. 6. Hyperlipidemia 7. Low testosterone 8. GERD 9. HTN 10. Hyperlipidemia 11. Low back pain.  12. CKD 13. Asthma as child.   SH: Nonsmoker, married, on disability now but worked for [employer].   FH: No heart disease.   ROS: All systems reviewed and negative except as per HPI.   Current Outpatient Prescriptions  Medication Sig Dispense Refill  . amLODipine (NORVASC) 10 MG tablet TAKE 1 TABLET BY MOUTH DAILY  30 tablet  2  . buPROPion (WELLBUTRIN SR) 200 MG 12 hr tablet Take  200 mg by mouth 2 (two) times daily.      . [facility] STOOL SOFTENER 100 MG capsule TAKE 2 TO 3 CAPSULES BY MOUTH DAILY.  60 capsule  2  . divalproex (DEPAKOTE) 250 MG DR tablet Take 250 mg by mouth 3 (three) times daily.      . feeding supplement (ENSURE IMMUNE HEALTH) LIQD Take 237 mLs by mouth 3 (three) times daily with meals.      Marland Kitchen L-Methylfolate (DEPLIN) 7.5 MG TABS       . lithium carbonate (LITHOBID) 300 MG CR tablet Take 1 tablet (300 mg total) by mouth every evening.  20 tablet  0  . LORazepam (ATIVAN) 0.5 MG tablet Take 1 mg by mouth daily.       . Multiple Vitamin (MULTIVITAMIN) tablet Take 1 tablet by mouth daily. For nutritional supplementation.  30 tablet  0  . pantoprazole (PROTONIX) 40 MG tablet Take 40 mg by mouth as needed.       . primidone (MYSOLINE) 50 MG tablet Take 50 mg by mouth 4 (four) times daily.      . temazepam (RESTORIL) 15 MG capsule Take 15 mg by mouth at bedtime as needed.      . testosterone enanthate (DELATESTRYL) 200 MG/ML injection Inject 1 mL (200 mg total) into the muscle every 14 (fourteen) days. For IM use only  5 mL  0  .  aspirin EC 81 MG tablet Take 1 tablet (81 mg total) by mouth daily.      . [DISCONTINUED] mirtazapine (REMERON) 30 MG tablet Take by mouth ONE FOURTH tablet at bed time For insomnia.  1 tablet  0  . [DISCONTINUED] pravastatin (PRAVACHOL) 40 MG tablet Take 1 tablet (40 mg total) by mouth daily. To help lower cholesterol.  30 tablet  0   No current facility-administered medications for this visit.   Facility-Administered Medications Ordered in Other Visits  Medication Dose Route Frequency Provider Last Rate Last Dose  . testosterone enanthate (DELATESTRYL) injection 200 mg  200 mg Intramuscular Q14 Days Collene Gobble, MD   200 mg at 06/04/12 1525    BP 120/68  Pulse 68  Ht 6\' 1"  (1.854 m)  Wt 207 lb (93.895 kg)  BMI 27.31 kg/m2  SpO2 99% General: NAD Neck: No JVD, no thyromegaly or thyroid nodule.  Lungs: Clear to auscultation  bilaterally with normal respiratory effort. CV: Nondisplaced PMI.  Heart regular S1/S2, no S3/S4, no murmur.  Trace right ankle edema.  No carotid bruit.  Normal pedal pulses.  Abdomen: Soft, nontender, no hepatosplenomegaly, no distention.  Skin: Intact without lesions or rashes.  Neurologic: Alert and oriented x 3. Has bilateral hand tremor.  Psych: Normal affect. Extremities: No clubbing or cyanosis.  HEENT: Normal.   Assessment/Plan: 1. Exertional dyspnea:  This has been present since last summer.  It has been stable => occurs with moderate to heavy exertion.  Nonsmoker.  He has atypical nonexertional chest pain as well.  Cardiac risk factors include HTN and hyperlipidemia.  ECG is unremarkable.  It may be that his symptoms are due to deconditioning over the last year in the setting of inactivity (related to bipolar disorder flare).  However, I think it is reasonable to initiate a cardiac workup.  - He would be unable to walk on treadmill due to ankle pain: Lexiscan sestamibi to be done.  - Echocardiogram for LV systolic function.  - Check BNP - He should start ASA 81 mg daily.  2. Hyperlipidemia: LDL was quite high in 5/13.  I will repeat lipids.  If LDL is still quite high, will start statin.  3. CKD: Creatinine has been stable around 2.  He has seen Dr. Eliott Nine in the past.  4. HTN: BP seems to be under reasonable control.   Marca Ancona 06/18/2012 12:46 PM

## 2012-06-18 NOTE — Telephone Encounter (Signed)
He is going for the physical therapy and they are helping him with his balance. He is going through cardiology work up right now, and wants to wait until his cardiac work up is complete before he goes for the orthopedic referral. I have told him to call me and let me know and I can do the referral when he is ready.

## 2012-06-19 NOTE — Telephone Encounter (Signed)
That is very reasonable just that [name] call us when he is ready .

## 2012-06-21 ENCOUNTER — Telehealth: Payer: Self-pay

## 2012-06-21 DIAGNOSIS — M545 Low back pain: Secondary | ICD-10-CM

## 2012-06-21 DIAGNOSIS — M79673 Pain in unspecified foot: Secondary | ICD-10-CM

## 2012-06-21 NOTE — Telephone Encounter (Signed)
PATIENT CALLING TO SPEAK TO [name] SOMETHING ABOUT CALLING EACH OTHER BACK AND FORTH A LOT ABOUT HIS ORTHOPEDIC REFERRAL? PLEASE CALL BACK IMMEDIATELY THANK YOU!

## 2012-06-22 ENCOUNTER — Ambulatory Visit (HOSPITAL_COMMUNITY): Payer: 59 | Attending: Cardiology | Admitting: Radiology

## 2012-06-22 DIAGNOSIS — R079 Chest pain, unspecified: Secondary | ICD-10-CM

## 2012-06-22 DIAGNOSIS — R0602 Shortness of breath: Secondary | ICD-10-CM

## 2012-06-22 DIAGNOSIS — R0989 Other specified symptoms and signs involving the circulatory and respiratory systems: Secondary | ICD-10-CM | POA: Insufficient documentation

## 2012-06-22 DIAGNOSIS — N189 Chronic kidney disease, unspecified: Secondary | ICD-10-CM | POA: Insufficient documentation

## 2012-06-22 DIAGNOSIS — R0609 Other forms of dyspnea: Secondary | ICD-10-CM | POA: Insufficient documentation

## 2012-06-22 DIAGNOSIS — I379 Nonrheumatic pulmonary valve disorder, unspecified: Secondary | ICD-10-CM | POA: Insufficient documentation

## 2012-06-22 NOTE — Progress Notes (Signed)
Echocardiogram performed.  

## 2012-06-23 ENCOUNTER — Other Ambulatory Visit: Payer: Self-pay | Admitting: Physician Assistant

## 2012-06-23 NOTE — Telephone Encounter (Signed)
I spoke to him, and he does want to proceed with the referral for ortho at this time. Referral made. To [facility]. FYI

## 2012-06-24 ENCOUNTER — Ambulatory Visit (HOSPITAL_COMMUNITY): Payer: 59 | Attending: Cardiology | Admitting: Radiology

## 2012-06-24 ENCOUNTER — Other Ambulatory Visit (INDEPENDENT_AMBULATORY_CARE_PROVIDER_SITE_OTHER): Payer: 59

## 2012-06-24 VITALS — BP 129/75 | HR 62 | Ht 73.0 in | Wt 209.0 lb

## 2012-06-24 DIAGNOSIS — R079 Chest pain, unspecified: Secondary | ICD-10-CM

## 2012-06-24 DIAGNOSIS — R0609 Other forms of dyspnea: Secondary | ICD-10-CM

## 2012-06-24 DIAGNOSIS — R0602 Shortness of breath: Secondary | ICD-10-CM

## 2012-06-24 DIAGNOSIS — R0989 Other specified symptoms and signs involving the circulatory and respiratory systems: Secondary | ICD-10-CM | POA: Insufficient documentation

## 2012-06-24 DIAGNOSIS — R0789 Other chest pain: Secondary | ICD-10-CM | POA: Insufficient documentation

## 2012-06-24 DIAGNOSIS — I10 Essential (primary) hypertension: Secondary | ICD-10-CM | POA: Insufficient documentation

## 2012-06-24 LAB — LIPID PANEL
HDL: 35.9 mg/dL — ABNORMAL LOW (ref >39.00)
Triglycerides: 112 mg/dL (ref 0.0–149.0)
VLDL: 22.4 mg/dL (ref 0.0–40.0)

## 2012-06-24 LAB — BRAIN NATRIURETIC PEPTIDE: Pro B Natriuretic peptide (BNP): 8 pg/mL (ref 0.0–100.0)

## 2012-06-24 MED ORDER — TECHNETIUM TC 99M SESTAMIBI GENERIC - CARDIOLITE
11.0000 | Freq: Once | INTRAVENOUS | Status: AC | PRN
  Administered 2012-06-24: 11 via INTRAVENOUS

## 2012-06-24 MED ORDER — TECHNETIUM TC 99M SESTAMIBI GENERIC - CARDIOLITE
33.0000 | Freq: Once | INTRAVENOUS | Status: AC | PRN
  Administered 2012-06-24: 33 via INTRAVENOUS

## 2012-06-24 MED ORDER — REGADENOSON 0.4 MG/5ML IV SOLN
0.4000 mg | Freq: Once | INTRAVENOUS | Status: AC
  Administered 2012-06-24: 0.4 mg via INTRAVENOUS

## 2012-06-24 NOTE — Progress Notes (Addendum)
[facility] SITE 3 NUCLEAR MED [address] [place], [place] [address] [phone]    Cardiology Nuclear Med Study  [name] is a 62 y.o. male     MRN : [id number]     DOB: 1951-04-23  Procedure Date: 06/24/2012  Nuclear Med Background Indication for Stress Test:  Evaluation for Ischemia History:  h/o Chemo;'03 MPS @ [facility]:OK per patient; ~6 yrs ago GXT @ [facility]:GEXBMW per patient; 06/22/12 Echo:EF=65-70%  Cardiac Risk Factors: Hypertension and Lipids  Symptoms:  Chest Tightness with Exertion (last episode of chest discomfort was yesterday), DOE, Fatigue and Light-Headedness   Nuclear Pre-Procedure Caffeine/Decaff Intake:  None NPO After: 8:00pm   Lungs:  Clear. O2 Sat: 99% on room air. IV 0.9% NS with Angio Cath:  22g  IV Site: R Hand  IV Started by:  Bonnita Levan, RN  Chest Size (in):  46 Cup Size: n/a  Height: 6\' 1"  (1.854 m)  Weight:  209 lb (94.802 kg)  BMI:  Body mass index is 27.57 kg/(m^2). Tech Comments:  N/A    Nuclear Med Study 1 or 2 day study: 1 day  Stress Test Type:  Lexiscan  Reading MD: Kristeen Miss, MD  Order Authorizing Provider:  Marca Ancona, MD  Resting Radionuclide: Technetium 71m Sestamibi  Resting Radionuclide Dose: 11.0 mCi   Stress Radionuclide:  Technetium 68m Sestamibi  Stress Radionuclide Dose: 33.0 mCi           Stress Protocol Rest HR: 62 Stress HR: 84  Rest BP: 129/75 Stress BP: 125/57  Exercise Time (min): n/a METS: n/a   Predicted Max HR: 160 bpm % Max HR: 52.5 bpm Rate Pressure Product: 41324    Dose of Adenosine (mg):  n/a Dose of Lexiscan: 0.4 mg  Dose of Atropine (mg): n/a Dose of Dobutamine: n/a mcg/kg/min (at max HR)  Stress Test Technologist: Smiley Houseman, CMA-N  Nuclear Technologist:  Domenic Polite, CNMT     Rest Procedure:  Myocardial perfusion imaging was performed at rest 45 minutes following the intravenous administration of Technetium 70m Sestamibi.  Rest ECG: NSR - Normal EKG  Stress Procedure:   The patient received IV Lexiscan 0.4 mg over 15-seconds.  Technetium 80m Sestamibi injected at 30-seconds.  He c/o chest tightness with Lexiscan.  Quantitative spect images were obtained after a 45 minute delay.  Stress ECG: No significant change from baseline ECG  QPS Raw Data Images:  Normal; no motion artifact; normal heart/lung ratio. Stress Images:  Normal homogeneous uptake in all areas of the myocardium. Rest Images:  Normal homogeneous uptake in all areas of the myocardium. Subtraction (SDS):  No evidence of ischemia. Transient Ischemic Dilatation (Normal <1.22):  1.09 Lung/Heart Ratio (Normal <0.45):  0.34  Quantitative Gated Spect Images QGS EDV:  116 ml QGS ESV:  50 ml  Impression Exercise Capacity:  Lexiscan with no exercise. BP Response:  Normal blood pressure response. Clinical Symptoms:  No significant symptoms noted. ECG Impression:  No significant ST segment change suggestive of ischemia. Comparison with Prior Nuclear Study: No images to compare  Overall Impression:  Normal stress nuclear study.  No evidence of ischemia.  Normal LV function  LV Ejection Fraction: 57%.  LV Wall Motion:  NL LV Function; NL Wall Motion.   Vesta Mixer, Montez Hageman., MD, [facility] 06/24/2012, 5:24 PM Office - [phone] Pager [phone]  Normal study.   Marca Ancona 06/25/2012 12:23 PM

## 2012-06-25 NOTE — Progress Notes (Signed)
Pt.notified

## 2012-06-29 ENCOUNTER — Telehealth: Payer: Self-pay

## 2012-06-29 NOTE — Telephone Encounter (Signed)
PT WOULD LIKE TO P/U HIS XRAYS, HAVE AN APPT ON Wednesday PLEASE CALL PT AT [phone]

## 2012-07-02 ENCOUNTER — Ambulatory Visit (INDEPENDENT_AMBULATORY_CARE_PROVIDER_SITE_OTHER): Payer: 59

## 2012-07-02 ENCOUNTER — Telehealth: Payer: Self-pay | Admitting: *Deleted

## 2012-07-02 DIAGNOSIS — R7989 Other specified abnormal findings of blood chemistry: Secondary | ICD-10-CM

## 2012-07-02 DIAGNOSIS — E291 Testicular hypofunction: Secondary | ICD-10-CM

## 2012-07-02 MED ORDER — TESTOSTERONE ENANTHATE 200 MG/ML IM SOLN
200.0000 mg | Freq: Once | INTRAMUSCULAR | Status: AC
  Administered 2012-07-02: 200 mg via INTRAMUSCULAR

## 2012-07-02 NOTE — Telephone Encounter (Signed)
Pt came in today for his testosterone injection. Pt wanted Dr. Cleta Alberts to know that he needs a refill on his testosterone.

## 2012-07-03 NOTE — Telephone Encounter (Signed)
Pharmacy called with refill info.

## 2012-07-03 NOTE — Telephone Encounter (Signed)
Please call the pharmacy and refill his testosterone prescription.

## 2012-07-05 ENCOUNTER — Ambulatory Visit: Payer: 59

## 2012-07-05 ENCOUNTER — Ambulatory Visit (INDEPENDENT_AMBULATORY_CARE_PROVIDER_SITE_OTHER): Payer: 59 | Admitting: Emergency Medicine

## 2012-07-05 ENCOUNTER — Emergency Department (HOSPITAL_COMMUNITY)
Admission: EM | Admit: 2012-07-05 | Discharge: 2012-07-05 | Disposition: A | Discharge status: Home / Self Care | Payer: 59 | Attending: Emergency Medicine | Admitting: Emergency Medicine

## 2012-07-05 ENCOUNTER — Encounter (HOSPITAL_COMMUNITY): Payer: Self-pay | Admitting: Emergency Medicine

## 2012-07-05 ENCOUNTER — Emergency Department (HOSPITAL_COMMUNITY): Payer: 59

## 2012-07-05 VITALS — BP 95/59 | HR 91 | Temp 98.1°F | Resp 16 | Ht 73.0 in | Wt 206.0 lb

## 2012-07-05 DIAGNOSIS — R935 Abnormal findings on diagnostic imaging of other abdominal regions, including retroperitoneum: Secondary | ICD-10-CM

## 2012-07-05 DIAGNOSIS — J45909 Unspecified asthma, uncomplicated: Secondary | ICD-10-CM | POA: Insufficient documentation

## 2012-07-05 DIAGNOSIS — Z8739 Personal history of other diseases of the musculoskeletal system and connective tissue: Secondary | ICD-10-CM | POA: Insufficient documentation

## 2012-07-05 DIAGNOSIS — E785 Hyperlipidemia, unspecified: Secondary | ICD-10-CM | POA: Insufficient documentation

## 2012-07-05 DIAGNOSIS — Z79899 Other long term (current) drug therapy: Secondary | ICD-10-CM | POA: Insufficient documentation

## 2012-07-05 DIAGNOSIS — R1110 Vomiting, unspecified: Secondary | ICD-10-CM

## 2012-07-05 DIAGNOSIS — R197 Diarrhea, unspecified: Secondary | ICD-10-CM | POA: Insufficient documentation

## 2012-07-05 DIAGNOSIS — K219 Gastro-esophageal reflux disease without esophagitis: Secondary | ICD-10-CM | POA: Insufficient documentation

## 2012-07-05 DIAGNOSIS — E860 Dehydration: Secondary | ICD-10-CM

## 2012-07-05 DIAGNOSIS — F319 Bipolar disorder, unspecified: Secondary | ICD-10-CM | POA: Insufficient documentation

## 2012-07-05 DIAGNOSIS — R109 Unspecified abdominal pain: Secondary | ICD-10-CM

## 2012-07-05 DIAGNOSIS — F3289 Other specified depressive episodes: Secondary | ICD-10-CM | POA: Insufficient documentation

## 2012-07-05 DIAGNOSIS — R748 Abnormal levels of other serum enzymes: Secondary | ICD-10-CM | POA: Insufficient documentation

## 2012-07-05 DIAGNOSIS — Z8709 Personal history of other diseases of the respiratory system: Secondary | ICD-10-CM | POA: Insufficient documentation

## 2012-07-05 DIAGNOSIS — C649 Malignant neoplasm of unspecified kidney, except renal pelvis: Secondary | ICD-10-CM

## 2012-07-05 DIAGNOSIS — I10 Essential (primary) hypertension: Secondary | ICD-10-CM | POA: Insufficient documentation

## 2012-07-05 DIAGNOSIS — R112 Nausea with vomiting, unspecified: Secondary | ICD-10-CM | POA: Insufficient documentation

## 2012-07-05 DIAGNOSIS — G4733 Obstructive sleep apnea (adult) (pediatric): Secondary | ICD-10-CM | POA: Insufficient documentation

## 2012-07-05 DIAGNOSIS — Z7982 Long term (current) use of aspirin: Secondary | ICD-10-CM | POA: Insufficient documentation

## 2012-07-05 DIAGNOSIS — F329 Major depressive disorder, single episode, unspecified: Secondary | ICD-10-CM | POA: Insufficient documentation

## 2012-07-05 DIAGNOSIS — Z85528 Personal history of other malignant neoplasm of kidney: Secondary | ICD-10-CM | POA: Insufficient documentation

## 2012-07-05 LAB — BASIC METABOLIC PANEL
BUN: 26 mg/dL — ABNORMAL HIGH (ref 6–23)
CO2: 27 mEq/L (ref 19–32)
Calcium: 8.2 mg/dL — ABNORMAL LOW (ref 8.4–10.5)
Chloride: 102 mEq/L (ref 96–112)
Creat: 2.29 mg/dL — ABNORMAL HIGH (ref 0.50–1.35)
Glucose, Bld: 98 mg/dL (ref 70–99)
Potassium: 4.6 mEq/L (ref 3.5–5.3)
Sodium: 138 mEq/L (ref 135–145)

## 2012-07-05 LAB — POCT CBC
MCH, POC: 31.6 pg — AB (ref 27–31.2)
MCHC: 30.9 g/dL — AB (ref 31.8–35.4)
MCV: 102.3 fL — AB (ref 80–97)
MID (cbc): 0.5 (ref 0–0.9)
MPV: 8.1 fL (ref 0–99.8)
POC LYMPH PERCENT: 13.5 %L (ref 10–50)
POC MID %: 10.1 %M (ref 0–12)
Platelet Count, POC: 143 10*3/uL (ref 142–424)
RBC: 4.97 M/uL (ref 4.69–6.13)
RDW, POC: 14.9 %
WBC: 4.5 10*3/uL — AB (ref 4.6–10.2)

## 2012-07-05 LAB — COMPREHENSIVE METABOLIC PANEL
Albumin: 3.2 g/dL — ABNORMAL LOW (ref 3.5–5.2)
BUN: 25 mg/dL — ABNORMAL HIGH (ref 6–23)
Calcium: 8.3 mg/dL — ABNORMAL LOW (ref 8.4–10.5)
Creatinine, Ser: 2.25 mg/dL — ABNORMAL HIGH (ref 0.50–1.35)
Total Protein: 6.4 g/dL (ref 6.0–8.3)

## 2012-07-05 LAB — LIPASE, BLOOD: Lipase: 20 U/L (ref 11–59)

## 2012-07-05 LAB — URINALYSIS, ROUTINE W REFLEX MICROSCOPIC
Ketones, ur: NEGATIVE mg/dL
Nitrite: NEGATIVE
Specific Gravity, Urine: 1.012 (ref 1.005–1.030)
pH: 6 (ref 5.0–8.0)

## 2012-07-05 LAB — URINE MICROSCOPIC-ADD ON

## 2012-07-05 LAB — CBC WITH DIFFERENTIAL/PLATELET
Eosinophils Absolute: 0.1 10*3/uL (ref 0.0–0.7)
Hemoglobin: 15.2 g/dL (ref 13.0–17.0)
Lymphs Abs: 0.4 10*3/uL — ABNORMAL LOW (ref 0.7–4.0)
MCH: 32.8 pg (ref 26.0–34.0)
Monocytes Relative: 18 % — ABNORMAL HIGH (ref 3–12)
Neutro Abs: 3.1 10*3/uL (ref 1.7–7.7)
Neutrophils Relative %: 72 % (ref 43–77)
Platelets: 118 10*3/uL — ABNORMAL LOW (ref 150–400)
RBC: 4.63 MIL/uL (ref 4.22–5.81)
WBC: 4.4 10*3/uL (ref 4.0–10.5)

## 2012-07-05 MED ORDER — ONDANSETRON HCL 4 MG PO TABS: 4.0000 mg | ORAL_TABLET | Freq: Four times a day (QID) | ORAL | Status: DC

## 2012-07-05 MED ORDER — ONDANSETRON HCL 4 MG/2ML IJ SOLN
4.0000 mg | Freq: Once | INTRAMUSCULAR | Status: AC
  Administered 2012-07-05: 4 mg via INTRAVENOUS
  Filled 2012-07-05: qty 2

## 2012-07-05 MED ORDER — TESTOSTERONE ENANTHATE 200 MG/ML IM SOLN: 200.0000 mg | INTRAMUSCULAR | Status: DC

## 2012-07-05 NOTE — ED Provider Notes (Signed)
History     CSN: [id number]  Arrival date & time 07/05/12  1304   First MD Initiated Contact with Patient 07/05/12 1332      Chief Complaint  Patient presents with  . Abdominal Pain  . Emesis  . Diarrhea    (Consider location/radiation/quality/duration/timing/severity/associated sxs/prior treatment) HPI Comments: Pt states that he had a couple episodes of vomiting and diarrhea yesterday:pt states that he went to urgent care and they sent him over here for a scan:pt denies nausea and vomiting today:pt states that he has not had fever:pt states that he has had surgery for renal carcinoma, gallbladder and for metastatic disease  Patient is a 62 y.o. male presenting with abdominal pain. No language interpreter was used.  Abdominal Pain The primary symptoms of the illness include abdominal pain, nausea, vomiting and diarrhea. The primary symptoms of the illness do not include fever. The current episode started yesterday. The onset of the illness was gradual. The problem has been gradually improving.    Past Medical History  Diagnosis Date  . Renal cell carcinoma     chemo and xrt--lost rt kidney  . Childhood asthma   . Sinusitis   . Cellulitis     after left foot surgery  . Osteoarthritis   . OSA on CPAP     NPSG 05-08-07 AHI 63.1/hr,desat t0 80%/loud snoring  . Bipolar 1 disorder   . Hyperlipemia   . Mental disorder   . Depression   . GERD (gastroesophageal reflux disease)   . Hypertension     Past Surgical History  Procedure Date  . Foot surgery     left foot toes,   . Bunionectomy   . Nephrectomy 1998    rt., d/t cancer  . Cholecystectomy   . Colon surgery   . Kidney surgery 1998    Rt Kidney Removal     Family History  Problem Relation Age of Onset  . Hypertension    . Sleep apnea    . Emphysema    . Prostate cancer    . Arthritis Father     History  Substance Use Topics  . Smoking status: Never Smoker   . Smokeless tobacco: Never Used  . Alcohol  Use: No      Review of Systems  Constitutional: Negative for fever.  Respiratory: Negative.   Cardiovascular: Negative.   Gastrointestinal: Positive for nausea, vomiting, abdominal pain and diarrhea.    Allergies  Review of patient's allergies indicates no known allergies.  Home Medications   Current Outpatient Rx  Name  Route  Sig  Dispense  Refill  . AMLODIPINE BESYLATE 10 MG PO TABS      TAKE 1 TABLET BY MOUTH DAILY   30 tablet   2   . ASPIRIN EC 81 MG PO TBEC   Oral   Take 1 tablet (81 mg total) by mouth daily.         . BUPROPION HCL ER (SR) 200 MG PO TB12   Oral   Take 200 mg by mouth 2 (two) times daily.         . CVS STOOL SOFTENER 100 MG PO CAPS      TAKE 2-3 CAPSULES BY MOUTH EVERY DAY   60 capsule   0   . DIVALPROEX SODIUM 250 MG PO TBEC   Oral   Take 250 mg by mouth 3 (three) times daily.         Marland Kitchen ENSURE IMMUNE HEALTH PO LIQD  Oral   Take 237 mLs by mouth 3 (three) times daily with meals.         . DEPLIN 7.5 MG PO TABS               . LITHIUM CARBONATE ER 300 MG PO TBCR   Oral   Take 1 tablet (300 mg total) by mouth every evening.   20 tablet   0   . LORAZEPAM 0.5 MG PO TABS   Oral   Take 1 mg by mouth daily.          Marland Kitchen ONE-DAILY MULTI VITAMINS PO TABS   Oral   Take 1 tablet by mouth daily. For nutritional supplementation.   30 tablet   0   . PANTOPRAZOLE SODIUM 40 MG PO TBEC   Oral   Take 40 mg by mouth as needed.          Marland Kitchen PRIMIDONE 50 MG PO TABS   Oral   Take 50 mg by mouth 4 (four) times daily.         Marland Kitchen TEMAZEPAM 15 MG PO CAPS   Oral   Take 15 mg by mouth at bedtime as needed.         . TESTOSTERONE ENANTHATE 200 MG/ML IM OIL   Intramuscular   Inject 1 mL (200 mg total) into the muscle every 14 (fourteen) days. For IM use only   5 mL   0     BP 117/74  Pulse 68  Temp 98 F (36.7 C) (Oral)  Resp 18  SpO2 99%  Physical Exam  Nursing note and vitals reviewed. Constitutional: He is  oriented to person, place, and time. He appears well-developed and well-nourished.  HENT:  Head: Normocephalic and atraumatic.  Eyes: Conjunctivae normal and EOM are normal.  Neck: Neck supple.  Cardiovascular: Normal rate and regular rhythm.   Pulmonary/Chest: Effort normal and breath sounds normal.  Abdominal: Soft. Bowel sounds are normal. There is no tenderness.  Musculoskeletal: Normal range of motion.  Neurological: He is alert and oriented to person, place, and time.       Pt is very tremulous which pt states is baseline from his medications  Skin: Skin is warm and dry.  Psychiatric: He has a normal mood and affect.    ED Course  Procedures (including critical care time)  Labs Reviewed  CBC WITH DIFFERENTIAL - Abnormal; Notable for the following:    Platelets 118 (*)     Lymphocytes Relative 9 (*)     Lymphs Abs 0.4 (*)     Monocytes Relative 18 (*)     All other components within normal limits  COMPREHENSIVE METABOLIC PANEL - Abnormal; Notable for the following:    BUN 25 (*)     Creatinine, Ser 2.25 (*)     Calcium 8.3 (*)     Albumin 3.2 (*)     AST 54 (*)     ALT 101 (*)     GFR calc non Af Amer 30 (*)     GFR calc Af Amer 35 (*)     All other components within normal limits  URINALYSIS, ROUTINE W REFLEX MICROSCOPIC - Abnormal; Notable for the following:    Leukocytes, UA TRACE (*)     All other components within normal limits  LIPASE, BLOOD  URINE MICROSCOPIC-ADD ON   Ct Abdomen Pelvis Wo Contrast  07/05/2012  *RADIOLOGY REPORT*  Clinical Data: Abdominal pain with nausea and vomiting  CT ABDOMEN  AND PELVIS WITHOUT CONTRAST  Technique:  Multidetector CT imaging of the abdomen and pelvis was performed following the standard protocol without intravenous contrast.  Comparison: Abdomen pelvis CT 02/21/2012 and acute abdominal series 07/05/2012  Findings:No airspace disease or effusion at the lung bases.  There is some oral contrast in the distal thoracic esophagus.   The possibility of gastroesophageal reflux is not excluded.  Patient is status post right nephrectomy.  11 mm exophytic low density lesion extending from the anterior cortex of the upper pole of the left kidney is stable.  A punctate calcification in the upper pole of the left kidney, possibly a stone or parenchymal calcification is also stable.  There is no hydronephrosis on the left.  The left ureter is normal in caliber.  No left ureteral stone.  Urinary bladder appears within normal limits.  Noncontrast appearance of the liver is within normal limits. Patient is status post cholecystectomy.  The pancreas, spleen, and both adrenal glands within normal limits.  IVC graft repair again noted.  There is atrophy of the right rectus abdominal musculature.  Mild gaseous distention of the stomach, with an air contrast level.  No evidence of gastric wall thickening.  Small bowel loops are normal in caliber.  There is a moderate amount of formed stool throughout the colon. The appendix is upper normal in size. No periappendiceal stranding or fluid is seen.  There is no lymphadenopathy in the right lower quadrant.  Prostate gland within normal limits for size.  Abdominal aorta normal in caliber with scattered atherosclerotic calcification.  No retroperitoneal or mesenteric lymphadenopathy is identified.  IMPRESSION:  1.  No definite acute abnormality in the abdomen or pelvis. 2.  Right nephrectomy.  No evidence of lymphadenopathy or mass in the abdomen pelvis. 3.  The appendix is upper normal in caliber.  No periappendiceal inflammatory changes identified. 4.  Oral contrast within the distal thoracic esophagus.  The possibility of gastroesophageal reflux of oral contrast cannot be excluded.   Original Report Authenticated By: Britta Mccreedy, M.D.    Dg Abd Acute W/chest  07/05/2012  *RADIOLOGY REPORT*  Clinical Data: Vomiting and diarrhea.  ACUTE ABDOMEN SERIES (ABDOMEN 2 VIEW & CHEST 1 VIEW)  Comparison: CT  02/21/2012.Plain film chest of 04/03/2012  Findings: Frontal view of the chest demonstrates midline trachea. Normal heart size and mediastinal contours. No pleural effusion or pneumothorax.  Minimal elevation of the left hemidiaphragm. Clear lungs.  Abominal films demonstrate air fluid level in the stomach and throughout small bowel loops in the right and central abdomen. Extensive surgical clips in the right side of the abdomen.  On supine imaging, gas within non distended large and small bowel. Mild gastric distention. Distal gas and stool.  No abnormal abdominal calcifications.   No appendicolith.  IMPRESSION: Air fluid levels within the mid small bowel loops without bowel distention.  Concurrent mild gastric distention and gastric air fluid level.  Findings are favored to be related to adynamic ileus, as can be seen with a viral gastroenteritis.  No acute cardiopulmonary disease.   Original Report Authenticated By: Jeronimo Greaves, M.D.      1. Elevated LFTs   2. Vomiting and diarrhea       MDM  6:11 PM Pt continues to not have abdominal pain:pt has not had vomiting or diarrhea here:pt continues to have some nausea:pt had elevated lft and pt can have rechecked:likely due to medicationa        Teressa Lower, NP 07/05/12 1812

## 2012-07-05 NOTE — Telephone Encounter (Signed)
Printed, can fax 

## 2012-07-05 NOTE — Addendum Note (Signed)
Addended byCaffie Damme on: 07/05/2012 10:02 AM   Modules accepted: Orders

## 2012-07-05 NOTE — Telephone Encounter (Signed)
Faxed

## 2012-07-05 NOTE — ED Notes (Signed)
Pt in via [facility] from Urgent Care with abd pain, N/V. MD from UC sent pt here for eval.

## 2012-07-05 NOTE — Progress Notes (Signed)
  Subjective:    Patient ID: [name], male    DOB: 03-21-1951, 62 y.o.   MRN: [id number]  HPI patient was in his usual state of health until yesterday when he awakened and had 2 episodes of vomiting. He had a loose stool last night. His abdomen has stayed sore. He had a colonoscopy done in the fall of last year with some polyps but no cancers. He is status post surgery for renal cell carcinoma years ago. He still has his appendix. His gallbladder has also been removed.   Review of Systems he has a history of renal cell cancer and has done well post surgery. CT scan done in the fall of 2013 revealed no evidence of recurrence. He also had a colonoscopy done in the fall of 2013 with no cancer found at that time. He did have some polyps.     Objective:   Physical Exam patient is alert and cooperative. His face is somewhat worse. The posterior pharynx looks normal. His neck is supple. His chest is clear to auscultation and percussion. Cardiac exam is regular rate no murmurs or gallops. The abdomen is flat there is a large upper abdominal scar the abdomen is diffusely tender more on the left than the right.  There is a large amount of fluid in the stomach. There is no free air seen. There are multiple surgical clips seen. There are air-fluid levels throughout the right side of the abdomen  [facility] reading (PRIMARY) by  Dr. Cleta Alberts there is a large stomach filled present. The multiple clips in the right upper abdomen. There are air-fluid levels present on the right side of the abdomen. There is stool in the transverse and descending colon.      Assessment & Plan:  Patient is orthostatic he is on blood pressure pills. He has a history of renal disease and is also on lithium. We'll go ahead and do an acute abdominal series to rule out obstruction. Following this we'll give IV fluids and do a  CBC. He has renal disease so cannot have contrast with his CTs. We'll send him to the ER for evaluation. IV fluids  will be initiated

## 2012-07-06 ENCOUNTER — Ambulatory Visit: Payer: Self-pay | Admitting: Cardiology

## 2012-07-06 NOTE — ED Provider Notes (Signed)
Medical screening examination/treatment/procedure(s) were performed by non-physician practitioner and as supervising physician I was immediately available for consultation/collaboration.  Flint Melter, MD 07/06/12 1037

## 2012-07-07 NOTE — Progress Notes (Signed)
Completed prior auth for pt's Zofran 4 mg and received approval through 07/07/13. Faxed approval notice to pharmacy.

## 2012-07-09 ENCOUNTER — Telehealth: Payer: Self-pay

## 2012-07-09 NOTE — Telephone Encounter (Signed)
Pt states he needs copy of xray for feet  Has appt on Monday  bf

## 2012-07-10 ENCOUNTER — Telehealth: Payer: Self-pay

## 2012-07-10 NOTE — Telephone Encounter (Signed)
PT STATES HE DIDN'T GET HIS XRAY FOR THE FOOT WHICH HE NEED ASAP. HAD PICKED UP ONE DISC BUT NEED THE OTHER ALSO. PLEASE CALL [phone] WHEN HE HAD COME Sunday DR DAUB PUT HIM IN THE HOSPITAL

## 2012-07-12 ENCOUNTER — Other Ambulatory Visit: Payer: Self-pay | Admitting: Physician Assistant

## 2012-07-12 NOTE — Telephone Encounter (Signed)
Informed [name], she is making cd.

## 2012-07-23 ENCOUNTER — Ambulatory Visit (INDEPENDENT_AMBULATORY_CARE_PROVIDER_SITE_OTHER): Payer: 59 | Admitting: Family Medicine

## 2012-07-23 DIAGNOSIS — E291 Testicular hypofunction: Secondary | ICD-10-CM

## 2012-07-23 DIAGNOSIS — E349 Endocrine disorder, unspecified: Secondary | ICD-10-CM

## 2012-07-23 MED ORDER — TESTOSTERONE ENANTHATE 200 MG/ML IM SOLN
200.0000 mg | Freq: Once | INTRAMUSCULAR | Status: AC
  Administered 2012-07-23: 200 mg via INTRAMUSCULAR

## 2012-07-26 ENCOUNTER — Other Ambulatory Visit: Payer: Self-pay | Admitting: Physician Assistant

## 2012-07-27 NOTE — Progress Notes (Signed)
Reviewed and agree.

## 2012-08-04 ENCOUNTER — Encounter: Payer: Self-pay | Admitting: Emergency Medicine

## 2012-08-06 ENCOUNTER — Ambulatory Visit: Payer: 59 | Admitting: Family Medicine

## 2012-08-06 ENCOUNTER — Ambulatory Visit (INDEPENDENT_AMBULATORY_CARE_PROVIDER_SITE_OTHER): Payer: 59 | Admitting: Family Medicine

## 2012-08-06 VITALS — BP 144/82 | HR 65 | Temp 97.6°F | Resp 18 | Ht 74.0 in | Wt 205.0 lb

## 2012-08-06 DIAGNOSIS — E291 Testicular hypofunction: Secondary | ICD-10-CM

## 2012-08-06 LAB — COMPREHENSIVE METABOLIC PANEL
Albumin: 5 g/dL (ref 3.5–5.2)
CO2: 30 mEq/L (ref 19–32)
Glucose, Bld: 68 mg/dL — ABNORMAL LOW (ref 70–99)
Sodium: 138 mEq/L (ref 135–145)
Total Bilirubin: 0.4 mg/dL (ref 0.3–1.2)
Total Protein: 7.6 g/dL (ref 6.0–8.3)

## 2012-08-06 LAB — POCT CBC
HCT, POC: 50.7 % (ref 43.5–53.7)
Hemoglobin: 16.5 g/dL (ref 14.1–18.1)
Lymph, poc: 0.9 (ref 0.6–3.4)
MCHC: 32.5 g/dL (ref 31.8–35.4)
POC Granulocyte: 3.6 (ref 2–6.9)
WBC: 4.9 10*3/uL (ref 4.6–10.2)

## 2012-08-06 MED ORDER — TESTOSTERONE ENANTHATE 200 MG/ML IM SOLN
200.0000 mg | Freq: Once | INTRAMUSCULAR | Status: AC
  Administered 2012-08-06: 200 mg via INTRAMUSCULAR

## 2012-08-06 NOTE — Progress Notes (Addendum)
[facility] [address] [place] [address] [phone]  Date:  08/06/2012   Name:  [name]   DOB:  [birth year]   MRN:  [id number]  PCP:  Lucilla Edin, MD    Chief Complaint: Follow-up   History of Present Illness:  PLUMMER MATICH is a 62 y.o. very pleasant male patient who presents with the following:  He has noted that BOTH of his feet, ankles and lower legs will "get tight and hurt" for the last few nights.  He is not sure if this is due to "not eating right."  During the day they seem better.  He states that it hurts to walk- this has been the case for 9 months or so.  He is seeing an orthopedist for some problems with his feet.  Admits to eating a high salt diet and to eating out most meals.    The swelling gets better during the day.  He does not have any orthopnea or SOB  He alos notes that his hair seems to be falling out.    He does have a history of chronic renal failure.  Mr. Schetter has been troubled by many symptoms over the years which have often not fit with any particular diagnosis  Patient Active Problem List  Diagnosis  . MALIGNANT NEOPLASM OF KIDNEY EXCEPT PELVIS  . HYPERLIPIDEMIA  . BIPOLAR DISORDER UNSPECIFIED  . OBSTRUCTIVE SLEEP APNEA  . ASTHMA, CHILDHOOD  . METATARSALGIA  . DYSPNEA ON EXERTION  . Bipolar 1 disorder, mixed  . CKD (chronic kidney disease), stage III  . Bipolar I disorder, most recent episode (or current) depressed  . Constipation  . Altered mental status  . Hypogonadism male    Past Medical History  Diagnosis Date  . Renal cell carcinoma     chemo and xrt--lost rt kidney  . Childhood asthma   . Sinusitis   . Cellulitis     after left foot surgery  . Osteoarthritis   . OSA on CPAP     NPSG 05-08-07 AHI 63.1/hr,desat t0 80%/loud snoring  . Bipolar 1 disorder   . Hyperlipemia   . Mental disorder   . Depression   . GERD (gastroesophageal reflux disease)   . Hypertension     Past Surgical History   Procedure Laterality Date  . Foot surgery      left foot toes,   . Bunionectomy    . 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 Cholecystectomy    . Colon surgery    . Kidney surgery  1998    Rt Kidney Removal     History  Substance Use Topics  . Smoking status: Never Smoker   . Smokeless tobacco: Never Used  . Alcohol Use: No    Family History  Problem Relation Age of Onset  . Hypertension    . Sleep apnea    . Emphysema    . Prostate cancer    . Arthritis Father     No Known Allergies  Medication list has been reviewed and updated.  Current Outpatient Prescriptions on File Prior to Visit  Medication Sig Dispense Refill  . amLODipine (NORVASC) 10 MG tablet Take 10 mg by mouth daily.      Marland Kitchen aspirin EC 81 MG tablet Take 1 tablet (81 mg total) by mouth daily.      Marland Kitchen buPROPion (WELLBUTRIN SR) 200 MG 12 hr tablet Take 200 mg by mouth  2 (two) times daily.      . CVS STOOL SOFTENER 100 MG capsule TAKE 2-3 CAPSULES BY MOUTH EVERY DAY  60 capsule  3  . divalproex (DEPAKOTE ER) 250 MG 24 hr tablet Take 750 mg by mouth daily.      Marland Kitchen lactose free nutrition (BOOST) LIQD Take 237 mLs by mouth daily.      Marland Kitchen lithium carbonate (LITHOBID) 300 MG CR tablet Take 1 tablet (300 mg total) by mouth every evening.  20 tablet  0  . LORazepam (ATIVAN) 0.5 MG tablet Take 1 mg by mouth at bedtime.       . Multiple Vitamin (MULTIVITAMIN WITH MINERALS) TABS Take 1 tablet by mouth daily.      . primidone (MYSOLINE) 50 MG tablet Take 250 mg by mouth at bedtime.       . temazepam (RESTORIL) 15 MG capsule Take 15-30 mg by mouth at bedtime as needed. For sleep.      Marland Kitchen testosterone enanthate (DELATESTRYL) 200 MG/ML injection Inject 1 mL (200 mg total) into the muscle every 14 (fourteen) days. For IM use only  5 mL  0  . amLODipine (NORVASC) 10 MG tablet TAKE 1 TABLET BY MOUTH EVERY DAY  30 tablet  2  . docusate sodium (COLACE) 100 MG capsule Take 100-200 mg by mouth 2 (two) times daily.      . ondansetron  (ZOFRAN) 4 MG tablet Take 1 tablet (4 mg total) by mouth every 6 (six) hours.  12 tablet  0  . pantoprazole (PROTONIX) 40 MG tablet Take 40 mg by mouth daily as needed. For reflux.      . [DISCONTINUED] mirtazapine (REMERON) 30 MG tablet Take by mouth ONE FOURTH tablet at bed time For insomnia.  1 tablet  0  . [DISCONTINUED] pravastatin (PRAVACHOL) 40 MG tablet Take 1 tablet (40 mg total) by mouth daily. To help lower cholesterol.  30 tablet  0   No current facility-administered medications on file prior to visit.    Review of Systems:  As per HPI- otherwise negative.   Physical Examination: Filed Vitals:   08/06/12 1523  BP: 144/82  Pulse: 65  Temp: 97.6 F (36.4 C)  Resp: 18   Filed Vitals:   08/06/12 1523  Height: 6\' 2"  (1.88 m)  Weight: 205 lb (92.987 kg)   Body mass index is 26.31 kg/(m^2). Ideal Body Weight: Weight in (lb) to have BMI = 25: 194.3  GEN: WDWN, NAD, Non-toxic, A & O x 3- severe essential tremor at baseline HEENT: Atraumatic, Normocephalic. Neck supple. No masses, No LAD. Ears and Nose: No external deformity. CV: RRR, No M/G/R. No JVD. No thrill. No extra heart sounds. PULM: CTA B, no wheezes, crackles, rhonchi. No retractions. No resp. distress. No accessory muscle use. EXTR: No c/c/e NEURO Normal gait.  PSYCH: Normally interactive. Conversant. Not depressed or anxious appearing.  Calm demeanor Legs: there is no swelling, edema, redness, heat, or cords/ tenderness in the calves.  Normal exam  Results for orders placed in visit on 08/06/12  POCT CBC      Result Value Range   WBC 4.9  4.6 - 10.2 K/uL   Lymph, poc 0.9  0.6 - 3.4   POC LYMPH PERCENT 19.1  10 - 50 %L   MID (cbc) 0.4  0 - 0.9   POC MID % 7.8  0 - 12 %M   POC Granulocyte 3.6  2 - 6.9   Granulocyte percent 73.1  37 -  80 %G   RBC 5.10  4.69 - 6.13 M/uL   Hemoglobin 16.5  14.1 - 18.1 g/dL   HCT, POC 16.1  09.6 - 53.7 %   MCV 99.5 (*) 80 - 97 fL   MCH, POC 32.4 (*) 27 - 31.2 pg   MCHC  32.5  31.8 - 35.4 g/dL   RDW, POC 04.5     Platelet Count, POC 191  142 - 424 K/uL   MPV 7.2  0 - 99.8 fL    Assessment and Plan: Chronic renal failure - Plan: Comprehensive metabolic panel  Peripheral edema - Plan: POCT CBC, Comprehensive metabolic panel suspect high salt diet may contribute to his leg swelling at night, especially with his history of renal failure.  Encouraged him to eat at home more and watch his salt intake, await CMP.  Will plan further follow- up pending labs.    Abbe Amsterdam, MD

## 2012-08-07 ENCOUNTER — Encounter: Payer: Self-pay | Admitting: Family Medicine

## 2012-08-10 ENCOUNTER — Encounter: Payer: Self-pay | Admitting: Cardiology

## 2012-08-10 ENCOUNTER — Other Ambulatory Visit: Payer: Self-pay | Admitting: Emergency Medicine

## 2012-08-10 ENCOUNTER — Ambulatory Visit (INDEPENDENT_AMBULATORY_CARE_PROVIDER_SITE_OTHER): Payer: 59 | Admitting: Cardiology

## 2012-08-10 VITALS — BP 160/90 | HR 68 | Ht 74.0 in | Wt 209.0 lb

## 2012-08-10 NOTE — Patient Instructions (Addendum)
Your physician recommends that you schedule a follow-up appointment as needed with Dr McLean.  

## 2012-08-11 NOTE — Progress Notes (Signed)
Patient ID: [name], male   DOB: 09-26-1950, 62 y.o.   MRN: [id number] PCP: Dr. Cleta Alberts  62 yo with history of bipolar disorder, essential tremor, HTN, CKD, and hyperlipidemia presents for followup.  He was seen initially in 1/14 because of exertional dyspnea.  He has had renal cell carcinoma with right nephrectomy and now has creatinine around 2 at baseline. Last year, he had trouble with his bipolar disorder (manic phase after stopping lithium).  He lost a lot of weight and was relatively inactive.  Since last summer, he has noted exertional dyspnea.  He has been very short of breath walking up a flight of steps.  He is short of breath if he carries a heavy load.  Exercise is limited by bilateral hip and ankle pain.  He can walk about 1/2 mile on flat ground.  He is more limited by joint pain than dyspnea when he does this.  No orthopnea/PND.  He gets occasional central chest discomfort that is not related to exertion, no particular trigger.  He denies claudication, syncope, palpitations.    I had him get an echocardiogram.  This showed EF 65-70% with mild diastolic dysfunction.  A Lexiscan Sestamibi (not treadmill due to joint pain) was normal with no evidence for ischemia or infarction.  He has started going to the gym again and thinks that he is less short of breath with exertion recently.   Labs (5/13): LDL 171, HDL 40 Labs (9/13): TSH normal, K 5.1, creatinine 2.08 Labs (2/14): K 4.4, creatinine 1.92, BNP 8, LDL 116, HDL 36  PMH: 1. OSA: CPAP 2. Renal cell carcinoma: right nephrectomy in 1998, cancer returned and he later had chemotherapy and XRT.  Now in remission.  3. Allergic rhinitis 4. Bipolar disorder: on lithium. 5. Essential tremor. 6. Hyperlipidemia 7. Low testosterone 8. GERD 9. HTN 10. Hyperlipidemia 11. Low back pain.  12. CKD 13. Asthma as child.  14. Dyspnea: Echo (1/14) with EF 65-70%, grade I diastolic dysfunction.  Lexiscan Sestamibi (1/14) with EF 57%, no ischemia or  infarction.   SH: Nonsmoker, married, on disability now but worked for [employer].   FH: No heart disease.    Current Outpatient Prescriptions  Medication Sig Dispense Refill  . amLODipine (NORVASC) 10 MG tablet Take 10 mg by mouth daily.      Marland Kitchen amLODipine (NORVASC) 10 MG tablet TAKE 1 TABLET BY MOUTH EVERY DAY  30 tablet  2  . aspirin EC 81 MG tablet Take 1 tablet (81 mg total) by mouth daily.      Marland Kitchen buPROPion (WELLBUTRIN SR) 200 MG 12 hr tablet Take 200 mg by mouth 2 (two) times daily.      . [facility] STOOL SOFTENER 100 MG capsule TAKE 2-3 CAPSULES BY MOUTH EVERY DAY  60 capsule  3  . divalproex (DEPAKOTE ER) 250 MG 24 hr tablet Take 750 mg by mouth daily.      Marland Kitchen docusate sodium (COLACE) 100 MG capsule Take 100-200 mg by mouth 2 (two) times daily.      Marland Kitchen lactose free nutrition (BOOST) LIQD Take 237 mLs by mouth daily.      Marland Kitchen lithium carbonate (LITHOBID) 300 MG CR tablet Take 1 tablet (300 mg total) by mouth every evening.  20 tablet  0  . LORazepam (ATIVAN) 0.5 MG tablet Take 1 mg by mouth at bedtime.       . Multiple Vitamin (MULTIVITAMIN WITH MINERALS) TABS Take 1 tablet by mouth daily.      Marland Kitchen  ondansetron (ZOFRAN) 4 MG tablet Take 1 tablet (4 mg total) by mouth every 6 (six) hours.  12 tablet  0  . pantoprazole (PROTONIX) 40 MG tablet Take 40 mg by mouth daily as needed. For reflux.      . primidone (MYSOLINE) 50 MG tablet Take 250 mg by mouth at bedtime.       . temazepam (RESTORIL) 15 MG capsule Take 15-30 mg by mouth at bedtime as needed. For sleep.      Marland Kitchen testosterone enanthate (DELATESTRYL) 200 MG/ML injection INJECT INTO THE MUSCLE EVERY 14 DAYS AS DIRECTED  5 mL  0  . [DISCONTINUED] mirtazapine (REMERON) 30 MG tablet Take by mouth ONE FOURTH tablet at bed time For insomnia.  1 tablet  0  . [DISCONTINUED] pravastatin (PRAVACHOL) 40 MG tablet Take 1 tablet (40 mg total) by mouth daily. To help lower cholesterol.  30 tablet  0   No current facility-administered medications for this visit.     BP 160/90  Pulse 68  Ht 6\' 2"  (1.88 m)  Wt 209 lb (94.802 kg)  BMI 26.82 kg/m2  SpO2 96% General: NAD Neck: No JVD, no thyromegaly or thyroid nodule.  Lungs: Clear to auscultation bilaterally with normal respiratory effort. CV: Nondisplaced PMI.  Heart regular S1/S2, no S3/S4, no murmur.  Trace right ankle edema.  No carotid bruit.  Normal pedal pulses.  Abdomen: Soft, nontender, no hepatosplenomegaly, no distention.  Neurologic: Alert and oriented x 3. Has bilateral hand tremor.  Psych: Normal affect. Extremities: No clubbing or cyanosis.   Assessment/Plan: 1. Exertional dyspnea:  This has been present since last summer.  It has been stable => occurs with moderate to heavy exertion.  Nonsmoker.  Echo showed normal EF and no significant valvular abnormalities.  Lexiscan Sestamibi was normal.  BNP was normal.  It may be that his symptoms are due to deconditioning over the last year in the setting of inactivity (related to bipolar disorder flare).  2. Hyperlipidemia: LDL was quite high in 5/13.  It was considerably improved when checked in 2/14.   3. CKD: Creatinine has been stable around 2.  He has seen Dr. Eliott Nine in the past.  4. HTN: BP is high today.  He will check daily at home and bring readings to next appointment with Dr. Cleta Alberts.   No further cardiac workup at this time, can followup prn.   Marca Ancona 08/11/2012 9:06 AM

## 2012-08-15 DIAGNOSIS — Z0271 Encounter for disability determination: Secondary | ICD-10-CM

## 2012-08-19 ENCOUNTER — Telehealth: Payer: Self-pay

## 2012-08-19 NOTE — Telephone Encounter (Signed)
FYI TO DR DAUB:  [name] IS RUNNING INTO A PROBLEM W/[facility] AND TRYING TO GET PRESCRIPTION FOR TESTOSTERONE. [facility] WILL ONLY PAY FOR LARGER QTY. WH/WILL LAST FOR 60 DAYS. BUT WE TOLD HIM WE CAN ONLY USE BOTTLE UP TO 30 DAYS AFTER OPENING.  Najae WAS GOING TO CALL [facility], BUT WANTED YOU TO KNOW THIS INFO

## 2012-08-20 ENCOUNTER — Ambulatory Visit (INDEPENDENT_AMBULATORY_CARE_PROVIDER_SITE_OTHER): Payer: 59 | Admitting: Family Medicine

## 2012-08-20 DIAGNOSIS — E291 Testicular hypofunction: Secondary | ICD-10-CM

## 2012-08-20 MED ORDER — TESTOSTERONE CYPIONATE 200 MG/ML IM SOLN
200.0000 mg | INTRAMUSCULAR | Status: DC
  Administered 2012-08-20 – 2013-02-02 (×8): 200 mg via INTRAMUSCULAR

## 2012-08-20 NOTE — Telephone Encounter (Signed)
Patient has been told his medication is only good for 30 days after he opens the bottle. I have advised him okay to use longer.

## 2012-08-20 NOTE — Telephone Encounter (Signed)
The pharmacist has told us today we can only use the vials for one month after they have been punctured. He can order single dose vials and I will be happy to do this.

## 2012-09-03 ENCOUNTER — Ambulatory Visit (INDEPENDENT_AMBULATORY_CARE_PROVIDER_SITE_OTHER): Payer: 59 | Admitting: Family Medicine

## 2012-09-03 DIAGNOSIS — E291 Testicular hypofunction: Secondary | ICD-10-CM

## 2012-09-03 MED ORDER — TESTOSTERONE ENANTHATE 200 MG/ML IM SOLN
200.0000 mg | Freq: Once | INTRAMUSCULAR | Status: AC
  Administered 2012-09-03: 200 mg via INTRAMUSCULAR

## 2012-09-03 NOTE — Progress Notes (Signed)
  Subjective:    Patient ID: [name], male    DOB: 1950/11/03, 62 y.o.   MRN: [id number]  HPI pt presents for testosterone injection only.  Review of Systems     Objective:   Physical Exam        Assessment & Plan:

## 2012-09-14 ENCOUNTER — Other Ambulatory Visit: Payer: Self-pay | Admitting: Emergency Medicine

## 2012-09-14 ENCOUNTER — Other Ambulatory Visit: Payer: Self-pay

## 2012-09-14 NOTE — Telephone Encounter (Signed)
Called pt concerning his testosterone injs after denying RF d/t RF having been sent in on 08/10/12. Pt explained that even though 3 ml is left in vial, it is not supposed to be used after it has been opened for 28 days. He requests RF of the Testosterone enanthate for a 30 day supply, not 60.  Dr Cleta Alberts, I have pended Rx for your review. I didn't put RFs on it, but you may add if wanted.

## 2012-09-14 NOTE — Telephone Encounter (Signed)
PATIENT IS IN NEED OF TESTOSTERONE SHOTS. PATIENT HAS CALLED HIS PHARMACY ALREADY AND THEY HAVE NOT RECEIVED THE REFILL AUTHORIZATION FROM [place].   CALL BACK: [phone] PHARMACY: [facility] [address]

## 2012-09-15 ENCOUNTER — Encounter: Payer: Self-pay | Admitting: Emergency Medicine

## 2012-09-15 MED ORDER — TESTOSTERONE ENANTHATE 200 MG/ML IM SOLN: 200.0000 mg | INTRAMUSCULAR | Status: DC

## 2012-09-17 ENCOUNTER — Ambulatory Visit (INDEPENDENT_AMBULATORY_CARE_PROVIDER_SITE_OTHER): Payer: 59 | Admitting: Family Medicine

## 2012-09-17 DIAGNOSIS — E291 Testicular hypofunction: Secondary | ICD-10-CM

## 2012-10-01 ENCOUNTER — Ambulatory Visit (INDEPENDENT_AMBULATORY_CARE_PROVIDER_SITE_OTHER): Payer: 59 | Admitting: *Deleted

## 2012-10-01 DIAGNOSIS — E291 Testicular hypofunction: Secondary | ICD-10-CM

## 2012-10-01 NOTE — Progress Notes (Signed)
  Subjective:    Patient ID: [name], male    DOB: [birth year], 62 y.o.   MRN: [id number]  HPI    Review of Systems     Objective:   Physical Exam        Assessment & Plan:  Here for testosterone injection only.

## 2012-10-09 ENCOUNTER — Ambulatory Visit (INDEPENDENT_AMBULATORY_CARE_PROVIDER_SITE_OTHER): Payer: 59 | Admitting: Emergency Medicine

## 2012-10-09 VITALS — BP 124/76 | HR 65 | Temp 98.0°F | Resp 16 | Ht 72.75 in | Wt 206.0 lb

## 2012-10-09 DIAGNOSIS — R3911 Hesitancy of micturition: Secondary | ICD-10-CM

## 2012-10-09 DIAGNOSIS — G589 Mononeuropathy, unspecified: Secondary | ICD-10-CM

## 2012-10-09 DIAGNOSIS — E039 Hypothyroidism, unspecified: Secondary | ICD-10-CM

## 2012-10-09 DIAGNOSIS — G629 Polyneuropathy, unspecified: Secondary | ICD-10-CM

## 2012-10-09 DIAGNOSIS — F3160 Bipolar disorder, current episode mixed, unspecified: Secondary | ICD-10-CM

## 2012-10-09 LAB — POCT CBC
Granulocyte percent: 78.3 %G (ref 37–80)
HCT, POC: 50 % (ref 43.5–53.7)
Lymph, poc: 0.8 (ref 0.6–3.4)
MCH, POC: 33.1 pg — AB (ref 27–31.2)
MCV: 100.9 fL — AB (ref 80–97)
MID (cbc): 0.4 (ref 0–0.9)
POC LYMPH PERCENT: 14.7 %L (ref 10–50)
Platelet Count, POC: 175 10*3/uL (ref 142–424)
RDW, POC: 13.5 %
WBC: 5.3 10*3/uL (ref 4.6–10.2)

## 2012-10-09 LAB — POCT UA - MICROSCOPIC ONLY
Casts, Ur, LPF, POC: NEGATIVE
Yeast, UA: NEGATIVE

## 2012-10-09 LAB — POCT URINALYSIS DIPSTICK
Nitrite, UA: NEGATIVE
Protein, UA: NEGATIVE
Urobilinogen, UA: 0.2
pH, UA: 5

## 2012-10-09 MED ORDER — LEVOTHYROXINE SODIUM 50 MCG PO TABS: 50.0000 ug | ORAL_TABLET | Freq: Every day | ORAL | Status: DC

## 2012-10-09 NOTE — Progress Notes (Signed)
Subjective:    Patient ID: [name], male    DOB: 08-Apr-1951, 62 y.o.   MRN: [id number]  HPI 62 yo here for follow up on lab results from Dr. Caprice Renshaw office. TSH is mildly elevated, but T3 and T4 are normal. Also was told he is anemic. Is still on depakote and lithium.  States he stays so cold. Says at the gym, once he starts sweating, he gets too cold to continue. Has problems with neuropathy and leg cramps at night. Has to urinate at night and sometimes has difficulty with stream.  Feels like he is "going downhill" and has lost his passion for a lot of things. Has a lot of frustration building. Thinks that if he can get back to the gym and fix his cold intolerance that he will improve greatly.  Review of Systems  Constitutional: Positive for chills. Negative for fever.  Endocrine: Positive for cold intolerance.  Musculoskeletal:       Pain in both feet.       Objective:   Physical Exam  Constitutional: He is oriented to person, place, and time. He appears well-developed and well-nourished.  HENT:  Head: Normocephalic and atraumatic.  Cardiovascular: Normal rate, regular rhythm and normal heart sounds.   Pulmonary/Chest: Effort normal and breath sounds normal.  Neurological: He is alert and oriented to person, place, and time.  Foot exam: mildly tender diffusely across toes. Sensation in tact. Bunion on medial side of both feet.   Results for orders placed in visit on 10/09/12  POCT URINALYSIS DIPSTICK      Result Value Range   Color, UA yellow     Clarity, UA clear     Glucose, UA neg     Bilirubin, UA neg     Ketones, UA neg     Spec Grav, UA 1.020     Blood, UA neg     pH, UA 5.0     Protein, UA neg     Urobilinogen, UA 0.2     Nitrite, UA neg     Leukocytes, UA Trace    POCT UA - MICROSCOPIC ONLY      Result Value Range   WBC, Ur, HPF, POC 0-1     RBC, urine, microscopic neg     Bacteria, U Microscopic neg     Mucus, UA neg     Epithelial cells, urine per  micros neg     Crystals, Ur, HPF, POC neg     Casts, Ur, LPF, POC neg     Yeast, UA neg    POCT CBC      Result Value Range   WBC 5.3  4.6 - 10.2 K/uL   Lymph, poc 0.8  0.6 - 3.4   POC LYMPH PERCENT 14.7  10 - 50 %L   MID (cbc) 0.4  0 - 0.9   POC MID % 7.0  0 - 12 %M   POC Granulocyte 4.1  2 - 6.9   Granulocyte percent 78.3  37 - 80 %G   RBC 4.96  4.69 - 6.13 M/uL   Hemoglobin 16.4  14.1 - 18.1 g/dL   HCT, POC 41.3  24.4 - 53.7 %   MCV 100.9 (*) 80 - 97 fL   MCH, POC 33.1 (*) 27 - 31.2 pg   MCHC 32.8  31.8 - 35.4 g/dL   RDW, POC 01.0     Platelet Count, POC 175  142 - 424 K/uL   MPV 7.5  0 - 99.8 fL        Assessment & Plan:  TSH is elevated at 5.68. His symptoms or cold intolerance and cold sensation in his abdomen I think trying a low-dose of Synthroid as indicated. His renal function is stable. I did check a B12 level due to his neuropathy symptoms and repeated a CBC because there is some discrepancy in his hemoglobin checks.

## 2012-10-10 ENCOUNTER — Other Ambulatory Visit: Payer: Self-pay | Admitting: Physician Assistant

## 2012-10-10 LAB — PSA: PSA: 2.1 ng/mL (ref <=4.00)

## 2012-10-10 LAB — FOLATE: Folate: >20 ng/mL

## 2012-10-10 NOTE — Telephone Encounter (Signed)
PATIENT NEEDS TESTOSTERONE INJECTIONS CALLED IN TO HIS PHARMACY, [facility] [address]. [phone]

## 2012-10-14 ENCOUNTER — Other Ambulatory Visit: Payer: Self-pay | Admitting: Emergency Medicine

## 2012-10-16 ENCOUNTER — Ambulatory Visit (INDEPENDENT_AMBULATORY_CARE_PROVIDER_SITE_OTHER): Payer: 59 | Admitting: *Deleted

## 2012-10-16 DIAGNOSIS — E291 Testicular hypofunction: Secondary | ICD-10-CM

## 2012-10-16 DIAGNOSIS — E349 Endocrine disorder, unspecified: Secondary | ICD-10-CM

## 2012-10-16 NOTE — Progress Notes (Signed)
  Subjective:    Patient ID: [name], male    DOB: 07/27/1950, 62 y.o.   MRN: [id number]  HPI    Review of Systems     Objective:   Physical Exam        Assessment & Plan:  Patient here for testosterone injection only 

## 2012-10-30 ENCOUNTER — Ambulatory Visit (INDEPENDENT_AMBULATORY_CARE_PROVIDER_SITE_OTHER): Payer: 59 | Admitting: *Deleted

## 2012-10-30 ENCOUNTER — Other Ambulatory Visit: Payer: Self-pay | Admitting: Physician Assistant

## 2012-10-30 DIAGNOSIS — E220 Acromegaly and pituitary gigantism: Secondary | ICD-10-CM

## 2012-10-30 DIAGNOSIS — E229 Hyperfunction of pituitary gland, unspecified: Secondary | ICD-10-CM

## 2012-10-30 NOTE — Progress Notes (Signed)
  Subjective:    Patient ID: [name], male    DOB: 12-Jul-1950, 62 y.o.   MRN: [id number]  HPI    Review of Systems     Objective:   Physical Exam        Assessment & Plan:  Patient here for testosterone injection only

## 2012-11-04 ENCOUNTER — Ambulatory Visit (INDEPENDENT_AMBULATORY_CARE_PROVIDER_SITE_OTHER): Payer: 59 | Admitting: Emergency Medicine

## 2012-11-04 VITALS — BP 150/72 | HR 88 | Temp 98.4°F | Resp 16 | Ht 74.0 in | Wt 210.0 lb

## 2012-11-04 DIAGNOSIS — N289 Disorder of kidney and ureter, unspecified: Secondary | ICD-10-CM

## 2012-11-04 DIAGNOSIS — E039 Hypothyroidism, unspecified: Secondary | ICD-10-CM

## 2012-11-04 DIAGNOSIS — R531 Weakness: Secondary | ICD-10-CM

## 2012-11-04 DIAGNOSIS — F319 Bipolar disorder, unspecified: Secondary | ICD-10-CM

## 2012-11-04 LAB — BASIC METABOLIC PANEL
BUN: 25 mg/dL — ABNORMAL HIGH (ref 6–23)
Calcium: 9.9 mg/dL (ref 8.4–10.5)
Potassium: 4.5 mEq/L (ref 3.5–5.3)
Sodium: 137 mEq/L (ref 135–145)

## 2012-11-04 LAB — POCT URINALYSIS DIPSTICK
Bilirubin, UA: NEGATIVE
Blood, UA: NEGATIVE
Glucose, UA: NEGATIVE
Ketones, UA: NEGATIVE
pH, UA: 7

## 2012-11-04 NOTE — Progress Notes (Signed)
  Subjective:    Patient ID: [name], male    DOB: Feb 05, 1951, 62 y.o.   MRN: [id number]  HPI Patient states he doesn't believe his thyroid medication is working.  Patient states that he is very fatigue.  Patient also complains of being unsteady and chest pains.    Patient states that he just started PT for his feet yesterday, pt is hoping for good results with therapy.  Patient wants to know if you could recommend a nutrionalist or acupuncture to build his body back up from his poor eating habits in the past.  Patient states that he feels like an 62 year old man.     Review of Systems     Objective:   Physical Exam patient is alert and cooperative. He has a significant tremor of his right arm. His chest was clear to auscultation and percussion his heart is regular rate without murmurs abdomen is soft there is a large scar upper abdomen there are no masses and no tenderness        Assessment & Plan:  [name] has never fully recovered from the time when he had his medications changed. He has been able to improve and eat better. He continues to be concerned about atrophy of his genitals. He continues to complain of extreme fatigue. He continues to complain of being cold inside. Overall he looks good. His weight is up. I encouraged him to keep exercising. I encouraged him to eat a healthy diet. I make a referral for nutritional

## 2012-11-05 LAB — TESTOSTERONE, FREE, TOTAL, SHBG
Sex Hormone Binding: 30 nmol/L (ref 13–71)
Testosterone, Free: 210.5 pg/mL (ref 47.0–244.0)
Testosterone: 859 ng/dL (ref 300–890)

## 2012-11-12 ENCOUNTER — Telehealth: Payer: Self-pay

## 2012-11-12 NOTE — Telephone Encounter (Signed)
Pt comes in for testosterone shots. His current vial will expire as it was opened May 2. Pt thinks we should be automatically refilling this but I am not sure how we would keep track. [facility] [address]  With the 28 day rule, he is getting a 5 vial supply and only using 2 shots--can we call in a smaller amount?  [place] [phone]

## 2012-11-12 NOTE — Telephone Encounter (Signed)
He will have to talk to the pharmacy and see if it is possible for him to order 2 cc vials or #2  1 cc vials and see if this would be cheaper for him

## 2012-11-13 ENCOUNTER — Telehealth: Payer: Self-pay

## 2012-11-13 MED ORDER — TESTOSTERONE ENANTHATE 200 MG/ML IM SOLN: INTRAMUSCULAR | Status: DC

## 2012-11-13 NOTE — Telephone Encounter (Signed)
Can you sign this Rx for Dr Cleta Alberts? It is on my box.

## 2012-11-13 NOTE — Telephone Encounter (Signed)
faxed

## 2012-11-13 NOTE — Telephone Encounter (Signed)
Pt is calling because he went to his pharmacy for testosterone shot and he said something about he was out of refills? He states he had one on 5-2 and 5-16 and is due for one today Call back number is [phone]

## 2012-11-13 NOTE — Telephone Encounter (Signed)
Signed in your box

## 2012-11-14 ENCOUNTER — Other Ambulatory Visit: Payer: Self-pay | Admitting: Emergency Medicine

## 2012-11-14 NOTE — Telephone Encounter (Signed)
Pt wants to know how he can keep getting refills on his testosterone instead of having to worry about getting refills called in every 28 days.  I did not know if we can do this or not.  I did have Chelle check on the previous message and told him that his script is ready at the pharmacy.  [phone]

## 2012-11-15 ENCOUNTER — Other Ambulatory Visit: Payer: Self-pay | Admitting: Physician Assistant

## 2012-11-15 NOTE — Telephone Encounter (Signed)
Dr. Cleta Alberts can authorize refills on the prescription (up to 6 months) if he chooses.   Please advise the patient, and I'll route the message to Dr. Cleta Alberts for review.

## 2012-11-16 NOTE — Telephone Encounter (Signed)
Patient can have for refills on his medication. He had his last testosterone and PSA levels done in April

## 2012-11-16 NOTE — Telephone Encounter (Signed)
Pt rx faxed on 5/30.  See previous phone message

## 2012-11-17 ENCOUNTER — Telehealth: Payer: Self-pay

## 2012-11-17 NOTE — Telephone Encounter (Signed)
Telephone call to patient (spoke to patient's wife) testosterone called into [facility] [address]

## 2012-11-18 ENCOUNTER — Ambulatory Visit (INDEPENDENT_AMBULATORY_CARE_PROVIDER_SITE_OTHER): Payer: 59 | Admitting: Family Medicine

## 2012-11-18 DIAGNOSIS — E291 Testicular hypofunction: Secondary | ICD-10-CM

## 2012-11-27 ENCOUNTER — Ambulatory Visit: Payer: Self-pay | Admitting: *Deleted

## 2012-11-29 ENCOUNTER — Other Ambulatory Visit: Payer: Self-pay | Admitting: Physician Assistant

## 2012-12-04 ENCOUNTER — Telehealth: Payer: Self-pay

## 2012-12-04 ENCOUNTER — Ambulatory Visit (INDEPENDENT_AMBULATORY_CARE_PROVIDER_SITE_OTHER): Payer: 59 | Admitting: *Deleted

## 2012-12-04 DIAGNOSIS — E291 Testicular hypofunction: Secondary | ICD-10-CM

## 2012-12-04 MED ORDER — TESTOSTERONE ENANTHATE 200 MG/ML IM SOLN
200.0000 mg | Freq: Once | INTRAMUSCULAR | Status: AC
  Administered 2012-12-04: 200 mg via INTRAMUSCULAR

## 2012-12-04 NOTE — Progress Notes (Signed)
  Subjective:    Patient ID: [name], male    DOB: 05/17/1951, 62 y.o.   MRN: [id number]  HPI    Review of Systems     Objective:   Physical Exam        Assessment & Plan:  Patient here for testosterone injection only 

## 2012-12-04 NOTE — Telephone Encounter (Signed)
NEEDS REFILL ON TESTERONE MED FOR INJECTIONS  [facility] [address]

## 2012-12-05 NOTE — Telephone Encounter (Signed)
Okay to refill medications. He can have refills for 3 months

## 2012-12-07 NOTE — Telephone Encounter (Signed)
Is this the correct testosterone? There are two in his medical list.

## 2012-12-08 ENCOUNTER — Encounter: Payer: Self-pay | Admitting: Family Medicine

## 2012-12-08 ENCOUNTER — Ambulatory Visit (INDEPENDENT_AMBULATORY_CARE_PROVIDER_SITE_OTHER): Payer: 59 | Admitting: Family Medicine

## 2012-12-08 VITALS — Ht 74.0 in | Wt 210.5 lb

## 2012-12-08 DIAGNOSIS — E785 Hyperlipidemia, unspecified: Secondary | ICD-10-CM

## 2012-12-08 DIAGNOSIS — E663 Overweight: Secondary | ICD-10-CM

## 2012-12-08 MED ORDER — TESTOSTERONE ENANTHATE 200 MG/ML IM SOLN: INTRAMUSCULAR | Status: DC

## 2012-12-08 NOTE — Telephone Encounter (Signed)
He injectable testosterone can be either one.

## 2012-12-08 NOTE — Patient Instructions (Addendum)
-   SLEEP:   Please explore getting a new CPAP mask, so you can keep it on better during the night.     Start recording your bedtime and getting up time.  Aim for consistency.  - Obtain a fruit and/or vegetable serving for both lunch and dinner.  Talk with your wife about which restaurants you can go to where you can get some better choices.   - Breakfast:  Make several portions of steel-cut oats, leftovers of which can be heated up with added milk.    Or:  Boiled eggs with 2 slices whole-wheat toast.    Or: Cold cereal with milk (choose at least 5 g fiber per serving.) - Get a source of protein with each meal.   - Increase water/fluids intake, but drink most before noon.   - ? Gamma-linoleic acid has sometimes been used to treat neuropathy ? -   - Sources: Black currant seed oil, borage oil, evening primrose oil.

## 2012-12-08 NOTE — Telephone Encounter (Signed)
Thanks, I have called this in for him.

## 2012-12-08 NOTE — Progress Notes (Signed)
Medical Nutrition Therapy:  Appt start time: 1500 end time:  1600.  Assessment:  Primary concerns today: Weight management and blood lipid management.  Prentis was diagnosed as bipolar in 108.  In 1998, he lost his left kidney to cancer after which he tried neurontin in place of lithium.  He later was prescribed a combination of lithium & depakote, which worked well for years.  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 -?-  Recovery went well; stayed on lithium to that point.  A couple yrs ago, his psychiatrist suggested he try getting off of lithium, but he became extremely manic.  He is better now, but has not felt totally right since then.  By Mar 2013, while at [facility], he stopped eating.  He has since developed both cold and heat intolerance.  He also has had foot problems, including surgeries for hammer toes and bunions, and he has developed neuropathy in his feet/legs.  He cannot do weight-bearing exercise for long b/c of foot pain.  He also has hip pain, although he has been doing some tai chi, and weights at [facility].  Usual physical activity includes 15-20 min stationary bike & ~30-40 min wts 4-5 X wk, and walk the dogs ~45 min 2 X wk.   Usual eating pattern includes 3 meals and 1-2 snacks per day. Frequent foods include sweet tea daily, and country style steak, mashed potatoes, & greens 1 x wk.  Avoided foods include stir-fried veg's. Most veg's, most fruit.   Usually eats lunch and supper out.  No appetite most days.  We discussed more home food preparation, but Dravon said his wife does not cook, and she does not want him to cook b/c of his tremor.  Restaurant foods, especially combined with his low intake of fruit, veg's, and fluids, are likely contributing to his elevated BP and lipids.   24-hr recall not done b/c pt could not remember intake.    Bedtime is 11:30 PM or 12 AM, gets up  at 6 AM to take thyroid med.  Has OSA, but has recently been finding his CPAP mask on the floor by morning.    Progress Towards Goal(s):  In progress.   Nutritional Diagnosis:  [place]-3.3 Overweight/obesity As related to energy balance.  As evidenced by BMI >27.    Intervention:  Nutrition education.  Monitoring/Evaluation:  Dietary intake, exercise, and body weight in 6 week(s).

## 2012-12-10 ENCOUNTER — Ambulatory Visit (INDEPENDENT_AMBULATORY_CARE_PROVIDER_SITE_OTHER): Payer: 59 | Admitting: Emergency Medicine

## 2012-12-10 ENCOUNTER — Ambulatory Visit: Payer: 59

## 2012-12-10 ENCOUNTER — Encounter: Payer: Self-pay | Admitting: Emergency Medicine

## 2012-12-10 VITALS — BP 122/70 | HR 66 | Temp 97.9°F | Resp 18 | Ht 74.0 in | Wt 210.0 lb

## 2012-12-10 DIAGNOSIS — E039 Hypothyroidism, unspecified: Secondary | ICD-10-CM

## 2012-12-10 DIAGNOSIS — R109 Unspecified abdominal pain: Secondary | ICD-10-CM

## 2012-12-10 DIAGNOSIS — K5900 Constipation, unspecified: Secondary | ICD-10-CM

## 2012-12-10 DIAGNOSIS — R350 Frequency of micturition: Secondary | ICD-10-CM

## 2012-12-10 LAB — POCT UA - MICROSCOPIC ONLY
Bacteria, U Microscopic: NEGATIVE
Casts, Ur, LPF, POC: NEGATIVE
Crystals, Ur, HPF, POC: NEGATIVE
Mucus, UA: NEGATIVE
RBC, urine, microscopic: NEGATIVE
Yeast, UA: NEGATIVE

## 2012-12-10 LAB — POCT CBC
Granulocyte percent: 78.1 %G (ref 37–80)
HCT, POC: 53.5 % (ref 43.5–53.7)
MPV: 7.9 fL (ref 0–99.8)
POC Granulocyte: 5.4 (ref 2–6.9)
POC LYMPH PERCENT: 14.2 %L (ref 10–50)
POC MID %: 7.7 %M (ref 0–12)
RDW, POC: 13.8 %

## 2012-12-10 LAB — POCT URINALYSIS DIPSTICK
Blood, UA: NEGATIVE
Glucose, UA: NEGATIVE
Nitrite, UA: NEGATIVE
Protein, UA: NEGATIVE
Urobilinogen, UA: 0.2
pH, UA: 7.5

## 2012-12-10 NOTE — Progress Notes (Signed)
Subjective:    Patient ID: [name], male    DOB: July 11, 1950, 62 y.o.   MRN: [id number]  HPI presents today with abd pain x 4-5 days.  Having increase bowel movements and sometimes diarrhea. He is going 2-3 times a day. Before this started he was having a BM once every few days.  No N/V or fever. Abdomen is tender to the touch. No changes in appetite since this started. Still doesn't have much of appetite. He has been seeing a nutritionist. Started seeing her just the other day so no new changes in diet. No new urinary sxs. Still having urinary frequency.  He is having some issues with CPAP mask and hasn't been using. He has not been sleeping well. Is going through a great deal of stress. His father has terminal cancer and spouse is concerned with possibility of losing her job.   Has seen to [facility] ortho foe feet. Has been getting PT.   Has been doing Thi Chi   He is going to see acupuncturist next week.  R great toenail that has been rubbing against top of shoe. It seems like his toenail could come off. Wanted to know if it could possibly be taken off.    Review of Systems     Objective:   Physical Exam Patient is alert and cooperative he is not ill-appearing. Chest is clear. There is a large scar in the upper abdomen. There is tenderness across the lower abdomen suprapubic and both lower portions of the abdomen without rebound. [facility] reading (PRIMARY) by  Dr. Cleta Alberts scattered air-fluid levels in the left side of the colon. There is a large stool burden. There is no evidence of small bowel obstruction Results for orders placed in visit on 12/10/12  POCT CBC      Result Value Range   WBC 6.9  4.6 - 10.2 K/uL   Lymph, poc 1.0  0.6 - 3.4   POC LYMPH PERCENT 14.2  10 - 50 %L   MID (cbc) 0.5  0 - 0.9   POC MID % 7.7  0 - 12 %M   POC Granulocyte 5.4  2 - 6.9   Granulocyte percent 78.1  37 - 80 %G   RBC 5.14  4.69 - 6.13 M/uL   Hemoglobin 17.2  14.1 - 18.1 g/dL   HCT, POC 40.9  81.1  - 53.7 %   MCV 104.1 (*) 80 - 97 fL   MCH, POC 33.5 (*) 27 - 31.2 pg   MCHC 32.1  31.8 - 35.4 g/dL   RDW, POC 91.4     Platelet Count, POC 153  142 - 424 K/uL   MPV 7.9  0 - 99.8 fL  POCT UA - MICROSCOPIC ONLY      Result Value Range   WBC, Ur, HPF, POC 0-1     RBC, urine, microscopic neg     Bacteria, U Microscopic neg     Mucus, UA neg     Epithelial cells, urine per micros 0-1     Crystals, Ur, HPF, POC neg     Casts, Ur, LPF, POC neg     Yeast, UA neg    POCT URINALYSIS DIPSTICK      Result Value Range   Color, UA yellow     Clarity, UA clear     Glucose, UA neg     Bilirubin, UA neg     Ketones, UA neg     Spec Grav, UA 1.015  Blood, UA neg     pH, UA 7.5     Protein, UA neg     Urobilinogen, UA 0.2     Nitrite, UA neg     Leukocytes, UA Trace          Assessment & Plan:

## 2012-12-14 ENCOUNTER — Encounter: Payer: Self-pay | Admitting: Family Medicine

## 2012-12-21 ENCOUNTER — Ambulatory Visit (INDEPENDENT_AMBULATORY_CARE_PROVIDER_SITE_OTHER): Payer: 59 | Admitting: *Deleted

## 2012-12-21 DIAGNOSIS — E291 Testicular hypofunction: Secondary | ICD-10-CM

## 2012-12-21 MED ORDER — TESTOSTERONE ENANTHATE 200 MG/ML IM SOLN
200.0000 mg | Freq: Once | INTRAMUSCULAR | Status: AC
  Administered 2012-12-21: 200 mg via INTRAMUSCULAR

## 2012-12-21 NOTE — Progress Notes (Signed)
  Subjective:    Patient ID: [name], male    DOB: [birth year], 62 y.o.   MRN: [id number]  HPI    Review of Systems     Objective:   Physical Exam        Assessment & Plan:  Patient here for Testosterone injection only.

## 2012-12-28 ENCOUNTER — Ambulatory Visit: Payer: Self-pay | Admitting: Dietician

## 2013-01-04 ENCOUNTER — Ambulatory Visit (INDEPENDENT_AMBULATORY_CARE_PROVIDER_SITE_OTHER): Payer: 59 | Admitting: *Deleted

## 2013-01-04 DIAGNOSIS — E291 Testicular hypofunction: Secondary | ICD-10-CM

## 2013-01-04 NOTE — Progress Notes (Signed)
  Subjective:    Patient ID: [name], male    DOB: November 19, 1950, 62 y.o.   MRN: [id number]  HPI    Review of Systems     Objective:   Physical Exam        Assessment & Plan:  Patient here for testosterone injection only. Injection given at 11:50 a.m.

## 2013-01-08 ENCOUNTER — Ambulatory Visit (INDEPENDENT_AMBULATORY_CARE_PROVIDER_SITE_OTHER): Payer: 59 | Admitting: Emergency Medicine

## 2013-01-08 VITALS — BP 138/82 | HR 64 | Temp 98.0°F | Resp 16 | Ht 74.0 in | Wt 214.0 lb

## 2013-01-08 DIAGNOSIS — Z7189 Other specified counseling: Secondary | ICD-10-CM

## 2013-01-08 NOTE — Progress Notes (Signed)
  Subjective:    Patient ID: [name], male    DOB: 28-Jun-1950, 62 y.o.   MRN: [id number]  HPI PT wants shingles vaccine.     Review of Systems     Objective:   Physical Exam Patient not examined.      Assessment & Plan:  Prescription given for shingles vaccine.

## 2013-01-09 ENCOUNTER — Telehealth: Payer: Self-pay

## 2013-01-09 NOTE — Telephone Encounter (Signed)
PT HAD A SHINGLES VACCINATION ABOUT 1 HOUR AGO AND WANTS TO KNOW HOW LONG IT TAKES FOR THE SHOT TO BE EFFECTIVE

## 2013-01-12 ENCOUNTER — Other Ambulatory Visit: Payer: Self-pay | Admitting: *Deleted

## 2013-01-12 DIAGNOSIS — R7989 Other specified abnormal findings of blood chemistry: Secondary | ICD-10-CM

## 2013-01-12 MED ORDER — TESTOSTERONE CYPIONATE 200 MG/ML IM SOLN
200.0000 mg | INTRAMUSCULAR | Status: DC
  Administered 2013-02-16 – 2013-03-02 (×2): 200 mg via INTRAMUSCULAR

## 2013-01-12 NOTE — Telephone Encounter (Signed)
It takes 1-2 weeks to be effective

## 2013-01-12 NOTE — Telephone Encounter (Signed)
Patient advised.

## 2013-01-18 ENCOUNTER — Telehealth: Payer: Self-pay

## 2013-01-18 ENCOUNTER — Other Ambulatory Visit: Payer: Self-pay | Admitting: Emergency Medicine

## 2013-01-18 DIAGNOSIS — R7989 Other specified abnormal findings of blood chemistry: Secondary | ICD-10-CM

## 2013-01-18 NOTE — Telephone Encounter (Signed)
Patient requests that his Testosterone Injection be called in TODAY, please!

## 2013-01-18 NOTE — Telephone Encounter (Signed)
Please advise, I could not get this medication to reorder.

## 2013-01-18 NOTE — Telephone Encounter (Signed)
I had the team leader call this morning so we can get this taken care of.

## 2013-01-19 ENCOUNTER — Ambulatory Visit (INDEPENDENT_AMBULATORY_CARE_PROVIDER_SITE_OTHER): Payer: 59 | Admitting: Family Medicine

## 2013-01-19 DIAGNOSIS — E291 Testicular hypofunction: Secondary | ICD-10-CM

## 2013-01-19 DIAGNOSIS — R7989 Other specified abnormal findings of blood chemistry: Secondary | ICD-10-CM

## 2013-01-25 ENCOUNTER — Ambulatory Visit (INDEPENDENT_AMBULATORY_CARE_PROVIDER_SITE_OTHER): Payer: 59 | Admitting: Family Medicine

## 2013-01-25 ENCOUNTER — Encounter: Payer: Self-pay | Admitting: Family Medicine

## 2013-01-25 VITALS — Ht 74.0 in | Wt 213.0 lb

## 2013-01-25 DIAGNOSIS — E663 Overweight: Secondary | ICD-10-CM

## 2013-01-25 DIAGNOSIS — E785 Hyperlipidemia, unspecified: Secondary | ICD-10-CM

## 2013-01-25 NOTE — Patient Instructions (Addendum)
-   Record your bedtime and getting up time. Aim for consistency in when you go to bed.  Food goals:    - Obtain a fruit and/or vegetable serving for both lunch and dinner.     Try salads when available.  Also, ask your server if you don't see veg's on the menu.  Other alternatives for veg's & fruit:  Use as snacks.     Make a list of fruit and vegetable snacks you will eat.  You especially need a snack in the afternoon.  Use this list for shopping to keep these foods on hand.     Eat your fruit, don't drink it!  - Breakfast: Make several portions of steel-cut oats, leftovers of which can be heated up with added milk.    Or: Boiled eggs with 2 slices whole-wheat toast.    Or: Cold cereal with milk (choose at least 5 g fiber per serving.)   - Increase water intake to at least 48 oz per day, starting with 16 oz when you first get up.    - Drink most before you finish your mid-day meal.   - Bring a water bottle to the gym when you work out, and be sure to finish it before you go home.    - Track your intake on the same calendar you use to record your sleep times.     - ? Gamma-linolneic acid has sometimes been used to treat neuropathy ? -  - Sources: Black currant seed oil, borage oil, evening primrose oil.  Talk to Asher Muir at [facility] about this ([address]).  - Bring your list of snacks and your record of sleep times and water intake to next appt.

## 2013-01-25 NOTE — Progress Notes (Signed)
Medical Nutrition Therapy:  Appt start time: 1130 end time:  1230.  Assessment:  Primary concerns today: Weight management and blood lipid management.  [name]'s father died two weeks ago.  The weeks leading up to his death were very stressful, and he still feels stressed now; his mother has shingles, his cousin had an emergency appendectomy yesterday.   Dvon has been going to [employer] for better balance, and he is getting acupuncture from [employer].   Jami had his CPAP mask re-fit, so he is now using it most nights.  He is taking two Restorils, so he falls asleep ok, but finds himself nodding off during the day when he sits still for long.   He has usually been drinking orange juice for breakfast, but seldom eating fruit otherwise.  He is still eating out all the time; his wife insists he meet her for lunch daily, which is her break in the day from a stressful job, and they eat dinner out regularly b/c she does not like to cook, and he cannot cook b/c of his tremors.  Abhi says it is difficult to get vegetables at any restaurants his wife wants to go to.  He had not considered salads, which he did say he'd be willing to try.    24-hr recall:  B ( AM)-  Carnation instant breakfast in milk, 12 oz orange juice  Snk ( AM)-  none L ( PM)-  Pork chop in gravy, 1/2 corn on cob, 24-32 oz sweet tea Snk ( PM)-  none D ( PM)-  [employer]:  3 oz [place], 1/2 c dressing, 2/3 c mashed sweet potatoes, 24-32 oz sweet decaf tea, 1/2 choc pie Snk ( PM)-  Handful of cashews (Not sure if recall is complete.)  Progress Towards Goal(s):  In progress.   Nutritional Diagnosis:  [place]-3.3 Overweight/obesity As related to energy balance.  As evidenced by BMI >27.    Intervention:  Nutrition education.  Monitoring/Evaluation:  Dietary intake, exercise, and body weight in 6 week(s).

## 2013-01-26 ENCOUNTER — Encounter: Payer: Self-pay | Admitting: Emergency Medicine

## 2013-02-02 ENCOUNTER — Telehealth: Payer: Self-pay | Admitting: Family Medicine

## 2013-02-02 ENCOUNTER — Ambulatory Visit (INDEPENDENT_AMBULATORY_CARE_PROVIDER_SITE_OTHER): Payer: 59 | Admitting: Family Medicine

## 2013-02-02 VITALS — BP 117/68 | HR 64 | Temp 97.7°F | Resp 16

## 2013-02-02 DIAGNOSIS — E291 Testicular hypofunction: Secondary | ICD-10-CM

## 2013-02-02 MED ORDER — TESTOSTERONE ENANTHATE 200 MG/ML IM SOLN: INTRAMUSCULAR | Status: DC

## 2013-02-02 NOTE — Telephone Encounter (Signed)
Pt needs refill on testosterone 

## 2013-02-02 NOTE — Telephone Encounter (Signed)
Please let patient know we can send in one refill. However he will need labs for further refill.

## 2013-02-02 NOTE — Telephone Encounter (Signed)
Pt needs refill on Testosterone before next injection  In 2 weeks.

## 2013-02-02 NOTE — Progress Notes (Signed)
  Subjective:    Patient ID: [name], male    DOB: [birth year], 62 y.o.   MRN: [id number]  HPI presents for testosterone injection.    Review of Systems     Objective:   Physical Exam        Assessment & Plan:

## 2013-02-03 ENCOUNTER — Ambulatory Visit (INDEPENDENT_AMBULATORY_CARE_PROVIDER_SITE_OTHER): Payer: 59 | Admitting: Family Medicine

## 2013-02-03 VITALS — BP 124/70 | HR 88 | Temp 98.1°F | Resp 18 | Wt 212.0 lb

## 2013-02-03 DIAGNOSIS — Z79899 Other long term (current) drug therapy: Secondary | ICD-10-CM

## 2013-02-03 DIAGNOSIS — E291 Testicular hypofunction: Secondary | ICD-10-CM

## 2013-02-03 DIAGNOSIS — R7989 Other specified abnormal findings of blood chemistry: Secondary | ICD-10-CM

## 2013-02-03 DIAGNOSIS — R079 Chest pain, unspecified: Secondary | ICD-10-CM

## 2013-02-03 NOTE — Progress Notes (Signed)
Subjective:    Patient ID: [name], male    DOB: 1951-06-08, 62 y.o.   MRN: [id number]  HPI [name] is a 62 y.o. male  Primary provider: Lucilla Edin, MD  Hx of low testosterone - most recent testosterone level 859 on 11/04/12. On 200mg  Delatestryl Q 14 days. Last injected yesterday.    PSA 2.10 on 10/09/12. (1.83 on 11/12/11). Father had prostate cancer at 49yo.  CBC wnl on 12/10/12 with HGB 17.2, except elevated MCV. LFT's wnl 08/06/12.   Last lipid panel 06/24/12 - TC 174, trig 112, HDL 35 (down from 40 last year), LDL 116 (down from 171 last year).  Not fasting currently.  Had lunch 2 hours ago.   After initial history - admitted to symptoms of chest pains - occasional. Going on "awhile" for last year. Sporadic pain if going up steps, walking dogs, etc. Only notes occasional symptoms  - few times er week.  "always stressed" Has been seen by cardiologist earlier this year. Had stress test - told this was ok. Not as bad now as in past.  Less chest pain now than earlier this year when evaluated by cardiologist (Dr. Jearld Pies) - 06/18/12. Normal EKG at that time.  Has had heartburn in the past, but not recently that he knows of, no water brash. Thinks center of chest, no dyspnea, no radiation to arm or neck.  Sometimes notes in am. Off PPI past 6 months. Doesn't want to take meds if not needed.   Lexiscan 06/22/12: Impression  Exercise Capacity: Lexiscan with no exercise.  BP Response: Normal blood pressure response.  Clinical Symptoms: No significant symptoms noted.  ECG Impression: No significant ST segment change suggestive of ischemia.  Comparison with Prior Nuclear Study: No images to compare  Overall Impression: Normal stress nuclear study. No evidence of ischemia. Normal LV function  LV Ejection Fraction: 57%. LV Wall Motion: NL LV Function; NL Wall Motion.   2D echo 06/22/12: Study Conclusions: Left ventricle: The cavity size was normal. Wall thickness was normal. Systolic function  was vigorous. The estimated ejection fraction was in the range of 65% to 70%. Wall motion was normal; there were no regional wall motion abnormalities. Doppler parameters are consistent with abnormal left ventricular relaxation (grade 1 diastolic dysfunction).  . Patient Active Problem List   Diagnosis Date Noted  . Over weight 12/08/2012  . Hypogonadism male 03/23/2012  . Altered mental status 09/06/2011  . Constipation 09/04/2011  . Bipolar I disorder, most recent episode (or current) depressed 09/01/2011  . Bipolar 1 disorder, mixed 08/23/2011  . CKD (chronic kidney disease), stage III 08/23/2011  . METATARSALGIA 11/11/2007  . MALIGNANT NEOPLASM OF KIDNEY EXCEPT PELVIS 06/02/2007  . HYPERLIPIDEMIA 06/02/2007  . BIPOLAR DISORDER UNSPECIFIED 06/02/2007  . OBSTRUCTIVE SLEEP APNEA 06/02/2007  . ASTHMA, CHILDHOOD 06/02/2007  . DYSPNEA ON EXERTION 06/02/2007   Past Surgical History  Procedure Laterality Date  . Foot surgery      left foot toes,   . Bunionectomy    . 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 Cholecystectomy    . Colon surgery    . Kidney surgery  1998    Rt Kidney Removal    No Known Allergies Prior to Admission medications   Medication Sig Start Date End Date Taking? Authorizing Provider  amLODipine (NORVASC) 10 MG tablet TAKE 1 TABLET BY MOUTH EVERY DAY 10/10/12  Yes Heather Jaquita Rector, PA-C  aspirin EC 81 MG tablet  Take 1 tablet (81 mg total) by mouth daily. 06/18/12  Yes Laurey Morale, MD  buPROPion [facility] SR) 200 MG 12 hr tablet Take 200 mg by mouth 2 (two) times daily.   Yes Historical Provider, MD  [employer] STOOL SOFTENER 100 MG capsule TAKE 2-3 CAPSULES BY MOUTH EVERY DAY 11/15/12  Yes Collene Gobble, MD  divalproex (DEPAKOTE ER) 250 MG 24 hr tablet Take 750 mg by mouth daily.   Yes Historical Provider, MD  docusate sodium (COLACE) 100 MG capsule Take 100-200 mg by mouth 2 (two) times daily.   Yes Historical Provider, MD  lactose free nutrition (BOOST) LIQD Take  237 mLs by mouth daily.   Yes Historical Provider, MD  levothyroxine (SYNTHROID, LEVOTHROID) 50 MCG tablet Take 1 tablet (50 mcg total) by mouth daily. 10/09/12  Yes Collene Gobble, MD  lithium carbonate (LITHOBID) 300 MG CR tablet Take 1 tablet (300 mg total) by mouth every evening. 09/09/11  Yes Clanford L Johnson, MD  LORazepam (ATIVAN) 0.5 MG tablet Take 1 mg by mouth at bedtime.    Yes Historical Provider, MD  Multiple Vitamin (MULTIVITAMIN WITH MINERALS) TABS Take 1 tablet by mouth daily.   Yes Historical Provider, MD  primidone (MYSOLINE) 50 MG tablet Take 250 mg by mouth at bedtime.    Yes Historical Provider, MD  temazepam (RESTORIL) 15 MG capsule Take 15-30 mg by mouth at bedtime as needed. For sleep.   Yes Historical Provider, MD  testosterone enanthate (DELATESTRYL) 200 MG/ML injection INJECT 1 MLS INTO THE MUSCLE EVERY 14 DAYS AS DIRECTED 02/02/13  Yes Ryan M Dunn, PA-C  ondansetron (ZOFRAN) 4 MG tablet Take 1 tablet (4 mg total) by mouth every 6 (six) hours. 07/05/12   Teressa Lower, NP  pantoprazole (PROTONIX) 40 MG tablet Take 40 mg by mouth daily as needed. For reflux. 03/19/12   Historical Provider, MD   History   Social History  . Marital Status: Married    Spouse Name: N/A    Number of Children: 0  . Years of Education: N/A   Occupational History  . [employer]-product developer    Social History Main Topics  . Smoking status: Never Smoker   . Smokeless tobacco: Never Used  . Alcohol Use: No  . Drug Use: No  . Sexual Activity: Not Currently   Other Topics Concern  . Not on file   Social History Narrative  . No narrative on file     Review of Systems  Constitutional: Negative for fever.  Respiratory: Negative for shortness of breath.   Cardiovascular: Positive for chest pain.  Gastrointestinal:       Heartburn prior.    Otherwise as above.      Objective:   Physical Exam  Vitals reviewed. Constitutional: He is oriented to person, place, and time. He  appears well-developed and well-nourished.  HENT:  Head: Normocephalic and atraumatic.  Eyes: EOM are normal. Pupils are equal, round, and reactive to light.  Neck: No JVD present. Carotid bruit is not present.  Cardiovascular: Normal rate, regular rhythm and normal heart sounds.   No murmur heard. Pulmonary/Chest: Effort normal and breath sounds normal. He has no rales.  nontender.  Musculoskeletal: He exhibits no edema.  Neurological: He is alert and oriented to person, place, and time.  Skin: Skin is warm and dry.  Psychiatric: He has a normal mood and affect. His behavior is normal.   EKG: sr, no changes from 05/19/12.     Assessment &  Plan:  [name] is a 62 y.o. male Chest pain - Plan: EKG 12-Lead, Comprehensive metabolic panel, Lipid panel  Low testosterone - Plan: Comprehensive metabolic panel, Lipid panel, PSA, Testosterone  High risk medication use - Plan: Comprehensive metabolic panel, Lipid panel, PSA, Testosterone   Hx of low testosterone  - slight increase in PSA from 1.8 to 2.1 form 10/2011 to 09/2012, FH of prostate cancer.  Will check PSA to look at velocity. Plans on labs tomorrow morning so will have fasting labs for lipid panel (option discussed to have other labs drawn today and have lipids drawn at next ov with Dr. Cleta Alberts, but he chose to rtc for fasting labs in am). Recheck CMP for LFT's. Lab only visit tomorrow morning.   Chest pains - nonspecific, longstanding and lessening subjectively since cardiac eval earlier this year. lexiscan and echo noted - reassuring.  DDx includes GERD as off PPI, but he declined restart of this medicine currently. Can try trigger avoidance as below, but may need to restart PPI or H2 blocker otc - to discuss with pharmacist with use of his other medications.   Patient Instructions  Return tomorrow for fasting bloodwork - LAB ONLY visit. You should receive a call or letter about your lab results within the next week to 10 days.   Try  to avoid known foods that can cause heartburn as below. If needed - restart acid blocker (protonix each day) OR over the counter Zantac, but discuss this with your pharmacist and your other medications. If any increase or change in your chest symptoms, return to clinic or emergency room if you feel this is needed. Return to the clinic or go to the nearest emergency room if any of your symptoms worsen or new symptoms occur. Recommend following up with Dr. Cleta Alberts in next 6-8 weeks to discuss this further.   Diet for Gastroesophageal Reflux Disease, Adult Reflux (acid reflux) is when acid from your stomach flows up into the esophagus. When acid comes in contact with the esophagus, the acid causes irritation and soreness (inflammation) in the esophagus. When reflux happens often or so severely that it causes damage to the esophagus, it is called gastroesophageal reflux disease (GERD). Nutrition therapy can help ease the discomfort of GERD. FOODS OR DRINKS TO AVOID OR LIMIT  Smoking or chewing tobacco. Nicotine is one of the most potent stimulants to acid production in the gastrointestinal tract.  Caffeinated and decaffeinated coffee and black tea.  Regular or low-calorie carbonated beverages or energy drinks (caffeine-free carbonated beverages are allowed).   Strong spices, such as black pepper, white pepper, red pepper, cayenne, curry powder, and chili powder.  Peppermint or spearmint.  Chocolate.  High-fat foods, including meats and fried foods. Extra added fats including oils, butter, salad dressings, and nuts. Limit these to less than 8 tsp per day.  Fruits and vegetables if they are not tolerated, such as citrus fruits or tomatoes.  Alcohol.  Any food that seems to aggravate your condition. If you have questions regarding your diet, call your caregiver or a registered dietitian. OTHER THINGS THAT MAY HELP GERD INCLUDE:   Eating your meals slowly, in a relaxed setting.  Eating 5 to 6  small meals per day instead of 3 large meals.  Eliminating food for a period of time if it causes distress.  Not lying down until 3 hours after eating a meal.  Keeping the head of your bed raised 6 to 9 inches (15 to 23 cm)  by using a foam wedge or blocks under the legs of the bed. Lying flat may make symptoms worse.  Being physically active. Weight loss may be helpful in reducing reflux in overweight or obese adults.  Wear loose fitting clothing EXAMPLE MEAL PLAN This meal plan is approximately 2,000 calories based on https://www.bernard.org/ meal planning guidelines. Breakfast   cup cooked oatmeal.  1 cup strawberries.  1 cup low-fat milk.  1 oz almonds. Snack  1 cup cucumber slices.  6 oz yogurt (made from low-fat or fat-free milk). Lunch  2 slice whole-wheat bread.  2 oz sliced [place].  2 tsp mayonnaise.  1 cup blueberries.  1 cup snap peas. Snack  6 whole-wheat crackers.  1 oz string cheese. Dinner   cup brown rice.  1 cup mixed veggies.  1 tsp olive oil.  3 oz grilled fish. Document Released: 06/03/2005 Document Revised: 08/26/2011 Document Reviewed: 04/19/2011 [facility] Patient Information 2014 [place], [place].

## 2013-02-03 NOTE — Patient Instructions (Addendum)
Return tomorrow for fasting bloodwork - LAB ONLY visit. You should receive a call or letter about your lab results within the next week to 10 days.   Try to avoid known foods that can cause heartburn as below. If needed - restart acid blocker (protonix each day) OR over the counter Zantac, but discuss this with your pharmacist and your other medications. If any increase or change in your chest symptoms, return to clinic or emergency room if you feel this is needed. Return to the clinic or go to the nearest emergency room if any of your symptoms worsen or new symptoms occur. Recommend following up with Dr. Cleta Alberts in next 6-8 weeks to discuss this further.   Diet for Gastroesophageal Reflux Disease, Adult Reflux (acid reflux) is when acid from your stomach flows up into the esophagus. When acid comes in contact with the esophagus, the acid causes irritation and soreness (inflammation) in the esophagus. When reflux happens often or so severely that it causes damage to the esophagus, it is called gastroesophageal reflux disease (GERD). Nutrition therapy can help ease the discomfort of GERD. FOODS OR DRINKS TO AVOID OR LIMIT  Smoking or chewing tobacco. Nicotine is one of the most potent stimulants to acid production in the gastrointestinal tract.  Caffeinated and decaffeinated coffee and black tea.  Regular or low-calorie carbonated beverages or energy drinks (caffeine-free carbonated beverages are allowed).   Strong spices, such as black pepper, white pepper, red pepper, cayenne, curry powder, and chili powder.  Peppermint or spearmint.  Chocolate.  High-fat foods, including meats and fried foods. Extra added fats including oils, butter, salad dressings, and nuts. Limit these to less than 8 tsp per day.  Fruits and vegetables if they are not tolerated, such as citrus fruits or tomatoes.  Alcohol.  Any food that seems to aggravate your condition. If you have questions regarding your diet, call  your caregiver or a registered dietitian. OTHER THINGS THAT MAY HELP GERD INCLUDE:   Eating your meals slowly, in a relaxed setting.  Eating 5 to 6 small meals per day instead of 3 large meals.  Eliminating food for a period of time if it causes distress.  Not lying down until 3 hours after eating a meal.  Keeping the head of your bed raised 6 to 9 inches (15 to 23 cm) by using a foam wedge or blocks under the legs of the bed. Lying flat may make symptoms worse.  Being physically active. Weight loss may be helpful in reducing reflux in overweight or obese adults.  Wear loose fitting clothing EXAMPLE MEAL PLAN This meal plan is approximately 2,000 calories based on https://www.bernard.org/ meal planning guidelines. Breakfast   cup cooked oatmeal.  1 cup strawberries.  1 cup low-fat milk.  1 oz almonds. Snack  1 cup cucumber slices.  6 oz yogurt (made from low-fat or fat-free milk). Lunch  2 slice whole-wheat bread.  2 oz sliced [place].  2 tsp mayonnaise.  1 cup blueberries.  1 cup snap peas. Snack  6 whole-wheat crackers.  1 oz string cheese. Dinner   cup brown rice.  1 cup mixed veggies.  1 tsp olive oil.  3 oz grilled fish. Document Released: 06/03/2005 Document Revised: 08/26/2011 Document Reviewed: 04/19/2011 [facility] Patient Information 2014 [place], [place].

## 2013-02-03 NOTE — Telephone Encounter (Signed)
LM for rtn call. 

## 2013-02-03 NOTE — Telephone Encounter (Signed)
Advised pt he will need to come in for future refills.

## 2013-02-04 ENCOUNTER — Other Ambulatory Visit (INDEPENDENT_AMBULATORY_CARE_PROVIDER_SITE_OTHER): Payer: 59

## 2013-02-04 DIAGNOSIS — Z79899 Other long term (current) drug therapy: Secondary | ICD-10-CM

## 2013-02-04 DIAGNOSIS — R079 Chest pain, unspecified: Secondary | ICD-10-CM

## 2013-02-04 DIAGNOSIS — R7989 Other specified abnormal findings of blood chemistry: Secondary | ICD-10-CM

## 2013-02-04 DIAGNOSIS — E291 Testicular hypofunction: Secondary | ICD-10-CM

## 2013-02-04 LAB — COMPREHENSIVE METABOLIC PANEL
Albumin: 4.2 g/dL (ref 3.5–5.2)
Alkaline Phosphatase: 64 U/L (ref 39–117)
BUN: 29 mg/dL — ABNORMAL HIGH (ref 6–23)
Calcium: 9.3 mg/dL (ref 8.4–10.5)
Chloride: 101 mEq/L (ref 96–112)
Creat: 2.19 mg/dL — ABNORMAL HIGH (ref 0.50–1.35)
Glucose, Bld: 77 mg/dL (ref 70–99)
Potassium: 4.5 mEq/L (ref 3.5–5.3)

## 2013-02-04 LAB — LIPID PANEL
HDL: 36 mg/dL — ABNORMAL LOW (ref >39)
Triglycerides: 143 mg/dL (ref <150)

## 2013-02-04 NOTE — Progress Notes (Signed)
Patient here for labs only. 

## 2013-02-09 DIAGNOSIS — Z0271 Encounter for disability determination: Secondary | ICD-10-CM

## 2013-02-13 IMAGING — CR DG LUMBAR SPINE COMPLETE 4+V
5 series · 5 of 5 positions shown · non-contrast
Comparison: CT dated 02/21/2012.

CLINICAL DATA: Low back pain.

LUMBAR SPINE - COMPLETE 4+ VIEW

[AP]
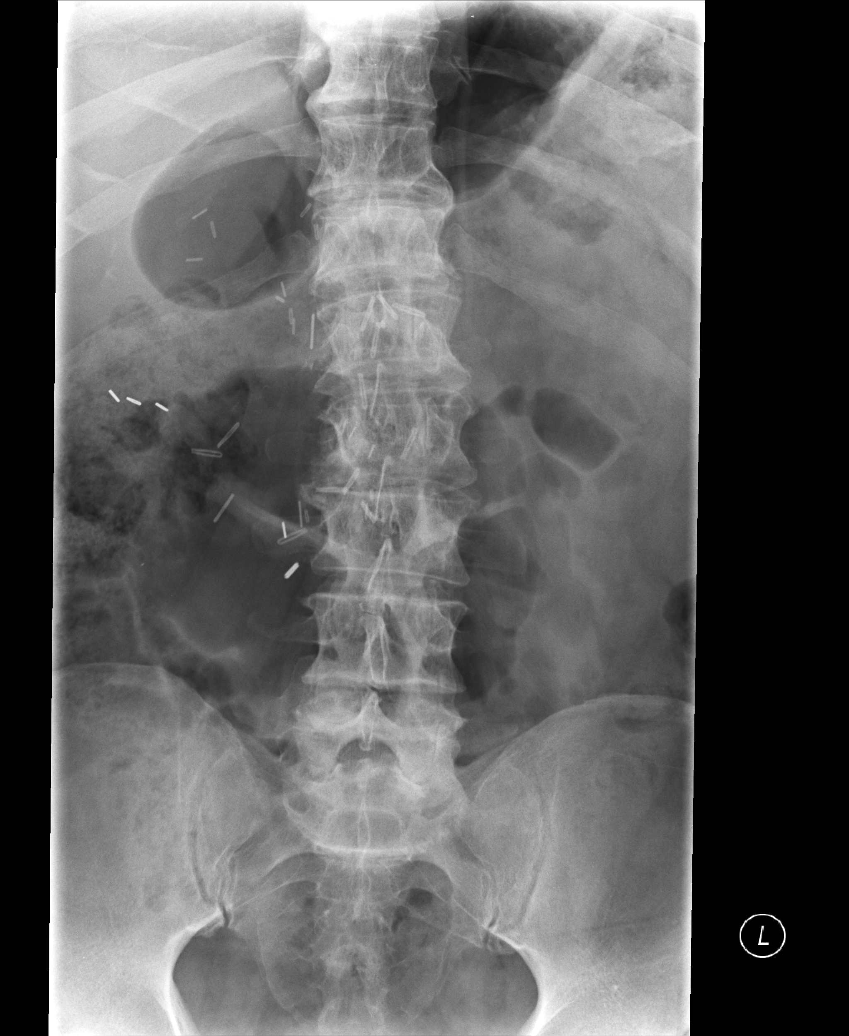

[rpo]
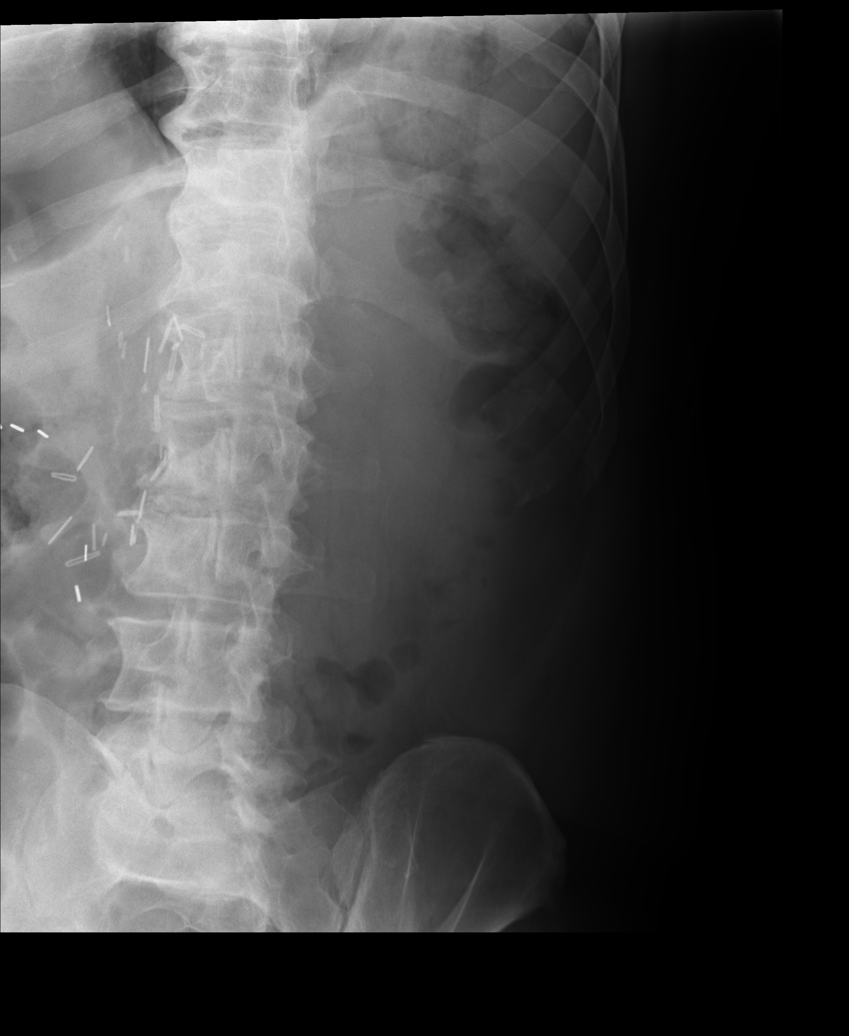

[lpo]
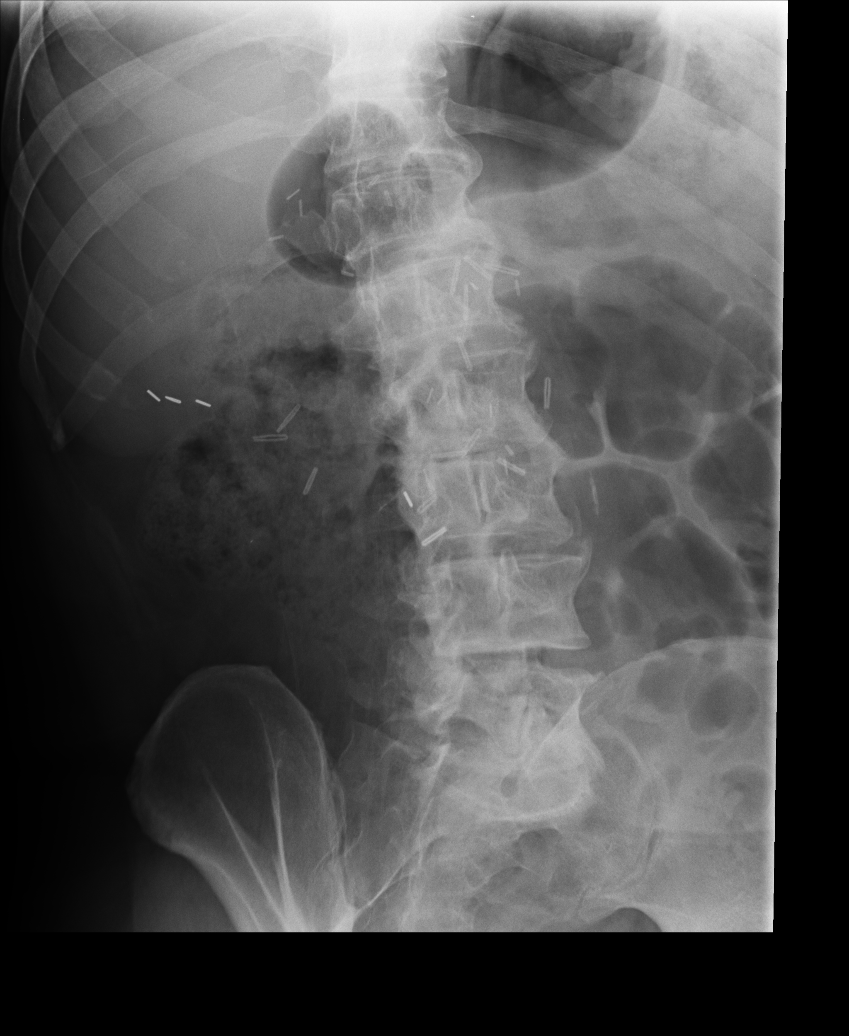

[lateral]
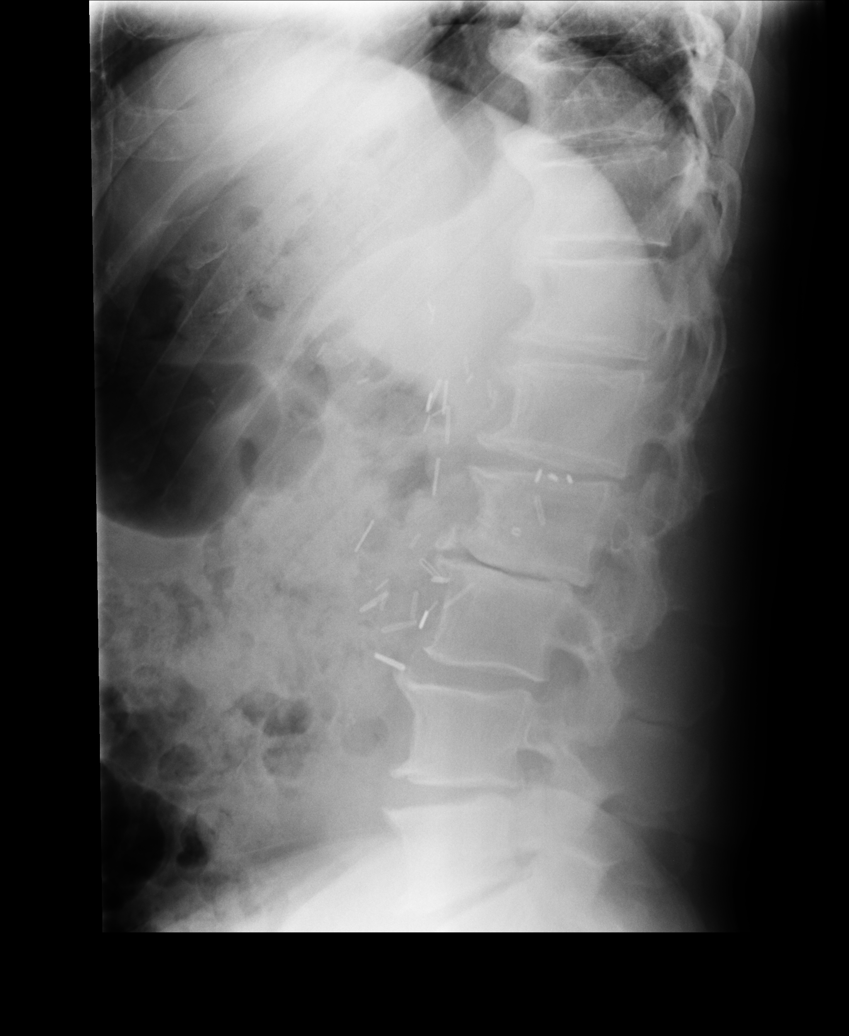

[l5 s1]
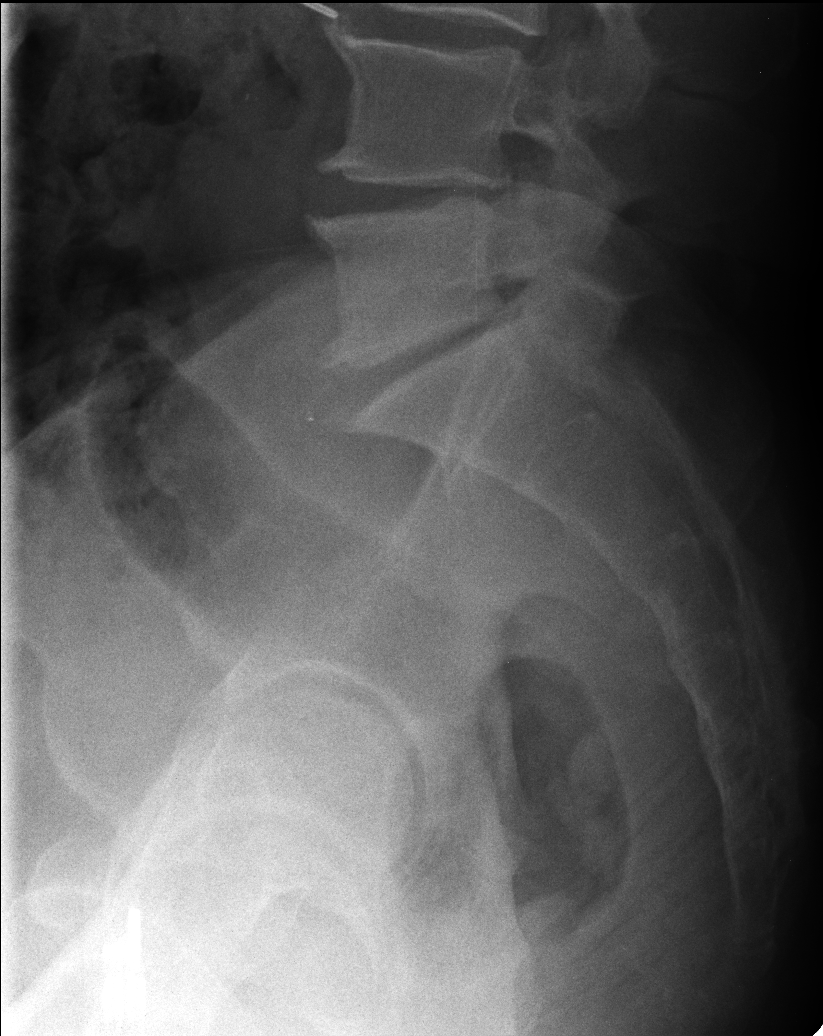

[5 of 5 positions shown; findings below may reference images not displayed]

FINDINGS: Moderately advanced spondylosis present of the lumbar
spine.  There is severe disc space narrowing with associated vacuum
disc at the L2-3 level.  Mild disc space narrowing is present at
the L3-4 level.  Moderate disc space narrowing is present at the L4-
5 and L5-S1 levels.  There is no evidence of significant listhesis
or subluxation.  No fractures or focal bony lesions are identified.
The patient is status post right nephrectomy with multiple clips
present.  Visualized sacrum and sacroiliac joints are unremarkable.
IMPRESSION: Moderately advanced spondylosis of the lumbar spine as above.

## 2013-02-16 ENCOUNTER — Ambulatory Visit (INDEPENDENT_AMBULATORY_CARE_PROVIDER_SITE_OTHER): Payer: 59

## 2013-02-16 DIAGNOSIS — R7989 Other specified abnormal findings of blood chemistry: Secondary | ICD-10-CM

## 2013-02-16 DIAGNOSIS — E291 Testicular hypofunction: Secondary | ICD-10-CM

## 2013-02-16 NOTE — Progress Notes (Signed)
  Subjective:    Patient ID: [name], male    DOB: 02/25/1951, 62 y.o.   MRN: [id number]  HPI Patient here for injection only.    Review of Systems     Objective:   Physical Exam        Assessment & Plan:   

## 2013-03-02 ENCOUNTER — Ambulatory Visit (INDEPENDENT_AMBULATORY_CARE_PROVIDER_SITE_OTHER): Payer: 59

## 2013-03-02 DIAGNOSIS — R7989 Other specified abnormal findings of blood chemistry: Secondary | ICD-10-CM

## 2013-03-02 DIAGNOSIS — E291 Testicular hypofunction: Secondary | ICD-10-CM

## 2013-03-02 NOTE — Progress Notes (Signed)
  Subjective:    Patient ID: [name], male    DOB: 1951/04/08, 62 y.o.   MRN: [id number]  HPI Patient here for injection only.    Review of Systems     Objective:   Physical Exam        Assessment & Plan:

## 2013-03-08 ENCOUNTER — Ambulatory Visit: Payer: Self-pay | Admitting: Family Medicine

## 2013-03-11 ENCOUNTER — Telehealth: Payer: Self-pay

## 2013-03-11 DIAGNOSIS — E291 Testicular hypofunction: Secondary | ICD-10-CM

## 2013-03-11 NOTE — Telephone Encounter (Signed)
Pt of Dr.Daub. His insurance is switching to where he will now be using [facility], fax number [phone]. The amlodipine 90 day supply and the testosterone cypionate.  Call at [phone].

## 2013-03-12 ENCOUNTER — Telehealth: Payer: Self-pay

## 2013-03-12 DIAGNOSIS — E039 Hypothyroidism, unspecified: Secondary | ICD-10-CM

## 2013-03-12 MED ORDER — LEVOTHYROXINE SODIUM 50 MCG PO TABS: 50.0000 ug | ORAL_TABLET | Freq: Every day | ORAL | Status: DC

## 2013-03-12 MED ORDER — AMLODIPINE BESYLATE 10 MG PO TABS: ORAL_TABLET | ORAL | Status: DC

## 2013-03-12 NOTE — Telephone Encounter (Signed)
Sent amlodipine, please advise on the refill of testosterone.

## 2013-03-12 NOTE — Telephone Encounter (Signed)
Patient states that he is switching to [facility] [phone]). He needs a refill on Levothroid and Amlodipine. Patient states he also gets a testosterone shot however the one that [facility] offers is called Cyteral ??? 1mL in a 90 day supply.  [phone]

## 2013-03-12 NOTE — Telephone Encounter (Signed)
OK to. refill testosterone .

## 2013-03-12 NOTE — Telephone Encounter (Signed)
Sent in the synthroid sent in the amlodipine sent message to Dr Cleta Alberts for the testosterone.

## 2013-03-14 ENCOUNTER — Other Ambulatory Visit: Payer: Self-pay | Admitting: Radiology

## 2013-03-14 DIAGNOSIS — E291 Testicular hypofunction: Secondary | ICD-10-CM

## 2013-03-14 MED ORDER — TESTOSTERONE ENANTHATE 200 MG/ML IM SOLN: INTRAMUSCULAR | Status: DC

## 2013-03-14 NOTE — Telephone Encounter (Signed)
Sent in

## 2013-03-16 ENCOUNTER — Ambulatory Visit (INDEPENDENT_AMBULATORY_CARE_PROVIDER_SITE_OTHER): Payer: 59 | Admitting: *Deleted

## 2013-03-16 DIAGNOSIS — E291 Testicular hypofunction: Secondary | ICD-10-CM

## 2013-03-16 MED ORDER — TESTOSTERONE ENANTHATE 200 MG/ML IM SOLN
200.0000 mg | INTRAMUSCULAR | Status: DC
  Administered 2013-03-16: 200 mg via INTRAMUSCULAR

## 2013-03-31 ENCOUNTER — Telehealth: Payer: Self-pay

## 2013-03-31 NOTE — Telephone Encounter (Signed)
PT STATES HE IS HAVING PROBLEMS WITH HIS FOOT AND WAS TOLD MORE THAN 3 TIMES TO COME IN AND SEE SOMEONE.  WOULD LIKE TO SPEAK WITH A NURSE FIRST. PLEASE CALL [phone]

## 2013-03-31 NOTE — Telephone Encounter (Signed)
Has had increased foot pain since taking pilates class and moving furniture has pain left leg also. Advised him he should come in for this, hard to tell if problems are from his back or his foot, since he has had extensive trouble with his back. Advised him to ice elevate rest until he comes in, he will come in tomorrow to see you.

## 2013-04-01 ENCOUNTER — Ambulatory Visit (INDEPENDENT_AMBULATORY_CARE_PROVIDER_SITE_OTHER): Payer: 59 | Admitting: Emergency Medicine

## 2013-04-01 ENCOUNTER — Ambulatory Visit (HOSPITAL_COMMUNITY)
Admission: RE | Admit: 2013-04-01 | Discharge: 2013-04-01 | Disposition: A | Discharge status: Home / Self Care | Payer: 59 | Source: Ambulatory Visit | Attending: Emergency Medicine | Admitting: Emergency Medicine

## 2013-04-01 ENCOUNTER — Encounter: Payer: Self-pay | Admitting: Emergency Medicine

## 2013-04-01 ENCOUNTER — Ambulatory Visit: Payer: 59

## 2013-04-01 ENCOUNTER — Telehealth: Payer: Self-pay | Admitting: Radiology

## 2013-04-01 VITALS — BP 134/80 | HR 66 | Temp 97.5°F | Resp 18 | Ht 72.75 in | Wt 209.0 lb

## 2013-04-01 DIAGNOSIS — M25562 Pain in left knee: Secondary | ICD-10-CM

## 2013-04-01 DIAGNOSIS — E291 Testicular hypofunction: Secondary | ICD-10-CM

## 2013-04-01 DIAGNOSIS — M79609 Pain in unspecified limb: Secondary | ICD-10-CM

## 2013-04-01 DIAGNOSIS — M7989 Other specified soft tissue disorders: Secondary | ICD-10-CM | POA: Insufficient documentation

## 2013-04-01 DIAGNOSIS — M25569 Pain in unspecified knee: Secondary | ICD-10-CM

## 2013-04-01 DIAGNOSIS — M79661 Pain in right lower leg: Secondary | ICD-10-CM

## 2013-04-01 DIAGNOSIS — R7989 Other specified abnormal findings of blood chemistry: Secondary | ICD-10-CM

## 2013-04-01 LAB — POCT CBC
Granulocyte percent: 69.2 %G (ref 37–80)
HCT, POC: 50 % (ref 43.5–53.7)
Lymph, poc: 1 (ref 0.6–3.4)
MCH, POC: 34 pg — AB (ref 27–31.2)
MCHC: 32.8 g/dL (ref 31.8–35.4)
MID (cbc): 0.5 (ref 0–0.9)
MPV: 7.7 fL (ref 0–99.8)
POC Granulocyte: 3.3 (ref 2–6.9)
POC LYMPH PERCENT: 20.5 %L (ref 10–50)
POC MID %: 10.3 %M (ref 0–12)
Platelet Count, POC: 175 10*3/uL (ref 142–424)
RDW, POC: 13.2 %

## 2013-04-01 LAB — URIC ACID: Uric Acid, Serum: 8.3 mg/dL — ABNORMAL HIGH (ref 4.0–7.8)

## 2013-04-01 NOTE — Telephone Encounter (Signed)
He indicated to me he will come in today.

## 2013-04-01 NOTE — Progress Notes (Signed)
  Subjective:    Patient ID: [name], male    DOB: 05-Dec-1950, 62 y.o.   MRN: [id number]  HPI injury to left leg, swelling. Pain in calf. Has been on long term testosterone. Has been doing pilates. A little sore in the ankle as well. He did the pilates MOnday evening. Tuesday was doing a lot of physical work, was standing for long periods of time.     Review of Systems     Objective:   Physical Exam patient has bilateral varicosities. There is tenderness on the lower calf on both legs but worse on the left. There is 2+ swelling of the leg on the left which involves the lower leg and into the ankle and dorsum of the foot. Healed surgical scars present over the foot.  [facility] reading (PRIMARY) by  Dr.Hero Mccathern there is an irregular area over the medial distal tibia. There is a slight cortical buckling with radio opaque linear line suspicious for stress injury. The foot shows signs of previous surgery. No fractures are seen in the foot or in the fibula.        Assessment & Plan:  I am concerned this could be a stress fracture to the tibia. X-ray sent to the radiologist for review. Patient has been on long-standing testosterone for hypogonadism and fatigue.. Will also check a stat venous Doppler of the legs to rule out a clot. Call report from vascular lab Doppler study was normal.

## 2013-04-01 NOTE — Progress Notes (Signed)
VASCULAR LAB PRELIMINARY  PRELIMINARY  PRELIMINARY  PRELIMINARY  Bilateral lower extremity venous Dopplers completed.    Preliminary report:  There is no DVT or SVT noted in the bilateral lower extremities.  Rocklyn Mayberry, RVT 04/01/2013, 2:43 PM

## 2013-04-01 NOTE — Telephone Encounter (Signed)
The patient had to come in for me to evaluate him

## 2013-04-01 NOTE — Telephone Encounter (Signed)
No further instructions

## 2013-04-01 NOTE — Patient Instructions (Signed)
You can go to [facility] today at 2pm, arrive at 1:30  for the doppler study, use the north tower entrance section A, free valet parking is available, if you can not find a parking spot.

## 2013-04-01 NOTE — Telephone Encounter (Signed)
Vascular lab has advised the doppler study is negative, I have had them advise patient and send him home. Are there any further instructions he needs?

## 2013-04-01 NOTE — Telephone Encounter (Signed)
Pt called to find out results of radiologist report on Xrays. I gave him normal results and instr'd him to rest leg and elevate as much as possible. D/W him use of ice for swelling. Dr Cleta Alberts, pt would like to know what the next step is? What do you want him to do and not do, and do you have any f/up instr's?

## 2013-04-02 LAB — TESTOSTERONE, FREE, TOTAL, SHBG
Sex Hormone Binding: 46 nmol/L (ref 13–71)
Testosterone, Free: 39.7 pg/mL — ABNORMAL LOW (ref 47.0–244.0)
Testosterone-% Free: 1.6 % (ref 1.6–2.9)
Testosterone: 254 ng/dL — ABNORMAL LOW (ref 300–890)

## 2013-04-02 MED ORDER — COLCHICINE 0.6 MG PO TABS: 0.6000 mg | ORAL_TABLET | Freq: Two times a day (BID) | ORAL | Status: DC

## 2013-04-02 NOTE — Telephone Encounter (Signed)
Called patient to advise, Colchicine sent in. He will follow up next week.

## 2013-04-02 NOTE — Telephone Encounter (Signed)
Call patient and let him know his uric acid is elevated and this is suspicious for gout. I would suggest he go on colchicine 0.6  One tab  twice a day for 7 days and then to see me next week

## 2013-04-03 ENCOUNTER — Other Ambulatory Visit: Payer: Self-pay | Admitting: Emergency Medicine

## 2013-04-06 ENCOUNTER — Ambulatory Visit (INDEPENDENT_AMBULATORY_CARE_PROVIDER_SITE_OTHER): Payer: 59 | Admitting: *Deleted

## 2013-04-06 DIAGNOSIS — E291 Testicular hypofunction: Secondary | ICD-10-CM

## 2013-04-06 MED ORDER — TESTOSTERONE ENANTHATE 200 MG/ML IM SOLN
200.0000 mg | INTRAMUSCULAR | Status: DC
  Administered 2013-04-06 – 2013-06-15 (×5): 200 mg via INTRAMUSCULAR

## 2013-04-07 ENCOUNTER — Telehealth: Payer: Self-pay | Admitting: Internal Medicine

## 2013-04-07 ENCOUNTER — Telehealth: Payer: Self-pay | Admitting: Endocrinology

## 2013-04-07 NOTE — Telephone Encounter (Signed)
Pt returned call, please call back this PM after 230pm / Sherri

## 2013-04-07 NOTE — Telephone Encounter (Signed)
Call pt regarding thyroid labs-these are not new/recent labs but pt has question regarding results of thyroid related labs that Dr. Everardo All ordered / Oneita Kras.

## 2013-04-07 NOTE — Telephone Encounter (Signed)
Pt states he is being treated for gout,should he hold off on doing  Tai [place]?   Best phone for pt is [phone]

## 2013-04-07 NOTE — Telephone Encounter (Signed)
Left message, pt to call back

## 2013-04-07 NOTE — Telephone Encounter (Signed)
Returning call / Sherri  °

## 2013-04-07 NOTE — Telephone Encounter (Signed)
I spoke with pt. He reports his CPAP broke last year and went 6 months w/o getting a replacement. He went out to [place] and did not have the same machine he had any longer.He got a phillips system 1. He stated he has lost some weight as well. His machine set at 14 cm. He wants to know what type of machine Dr. Maple Hudson thinks is a good machine? I advised pt he is also due for a follow up. He is scheduled to come in tomorrow. He reports he will discuss all this then.

## 2013-04-07 NOTE — Telephone Encounter (Signed)
Left message pt to call back

## 2013-04-08 ENCOUNTER — Encounter: Payer: Self-pay | Admitting: Internal Medicine

## 2013-04-08 ENCOUNTER — Ambulatory Visit (INDEPENDENT_AMBULATORY_CARE_PROVIDER_SITE_OTHER): Payer: 59 | Admitting: Internal Medicine

## 2013-04-08 VITALS — BP 118/74 | HR 64 | Ht 74.0 in | Wt 209.0 lb

## 2013-04-08 DIAGNOSIS — G4733 Obstructive sleep apnea (adult) (pediatric): Secondary | ICD-10-CM

## 2013-04-08 NOTE — Telephone Encounter (Signed)
If he is better he may resume activity. Will call.

## 2013-04-08 NOTE — Progress Notes (Signed)
04/05/11-62 year old male never smoker followed for obstructive sleep apnea complicated by history of dyspnea on exertion, renal cell cancer/chemotherapy, allergic rhinitis (Dr [place] Callas), bipolar disorder, essential tremor. Last here 05/14/2010 CPAP was changed to 15 CWP which seemed uncomfortably high for him. He reduced it himself to 14 CWP which is more comfortable and seems to be effective. He is using it every night. Bothersome dry mouth despite Biotene mouthwash. He had felt tired during the day but has had several medication changes and now feels better. Renal function required that lithium be stopped. His Depakote was increased and his Paxil is being tapered off by Dr.Plovsky.  04/10/12- 62 year old male never smoker followed for obstructive sleep apnea complicated by history of dyspnea on exertion, renal cell cancer/chemotherapy, allergic rhinitis (Dr [place] Callas), bipolar disorder, essential tremor. Had to get new machine as old one broke-really doesnt like the new one; pressure is okay. Wears CPAP 14/ [facility],  8 hours at night. Ramp not working.  Has 1 kidney. Tried off lithium. Became manic/ Dr Donell Beers, lost 40 lbs, hosp [facility].  04/08/13- 62 year old male never smoker followed for obstructive sleep apnea complicated by history of dyspnea on exertion, renal cell cancer/chemotherapy, allergic rhinitis (Dr [place] Callas), bipolar disorder, essential tremor FOLLOWS FOR: would like to know if he needs a new sleep study; still does not like his CPAP machine ; wears CPAP 14/ [facility] every night. Having some troubles with service from [place]. Does not like this particular machine. Had flu vaccine. Out of work on disability.  ROS-see HPI Constitutional:   No-   weight loss, night sweats, fevers, chills, fatigue, lassitude. HEENT:   No-  headaches, difficulty swallowing, tooth/dental problems, sore throat,       No-  sneezing, itching, ear ache, nasal congestion, post nasal drip,  CV:  No-   chest  pain, orthopnea, PND, swelling in lower extremities, anasarca, dizziness, palpitations Resp: No- recent problem with shortness of breath with exertion or at rest.              No-   productive cough,  No non-productive cough,  No- coughing up of blood.              No-   change in color of mucus.  No- wheezing.   Skin: No-   rash or lesions. GI:  No-   heartburn, indigestion, abdominal pain, nausea, vomiting,  GU:  MS:  No-   joint pain or swelling.   Neuro-     nothing unusual Psych:  No-acute change in mood or affect. No acute depression or anxiety.  No memory loss.  OBJ General- Alert, Oriented, Affect-appropriate, Distress- none acute; trim. Skin- rash-none, lesions- none, excoriation- none Lymphadenopathy- none Head- atraumatic            Eyes- Gross vision intact, PERRLA, conjunctivae clear secretions            Ears- Hearing, canals-normal            Nose- Clear, no-Septal dev, mucus, polyps, erosion, perforation             Throat- Mallampati III , mucosa clear , drainage- none, tonsils- atrophic Neck- flexible , trachea midline, no stridor , thyroid nl, carotid no bruit Chest - symmetrical excursion , unlabored           Heart/CV- RRR , no murmur , no gallop  , no rub, nl s1 s2                           -  JVD- none , edema -none, stasis changes- none, varices- none           Lung- clear to P&A, wheeze- none, cough- none , dullness-none, rub- none           Chest wall-  Abd-  Br/ Gen/ Rectal- Not done, not indicated Extrem- cyanosis- none, clubbing, none, atrophy- none, strength- nl Neuro- +significant resting and intentional tremor

## 2013-04-08 NOTE — Patient Instructions (Signed)
Order- PCC- DME [facility] autotitrate CPAP 8-20 cwp x 7days  For pressure recommendation  Options do include a repeat sleep study, discussion with Dr Donell Beers about the role of his meds in affecting your sleep, and discussing with Christoper Allegra what you like and dislike about your current CPAP machine.

## 2013-04-08 NOTE — Telephone Encounter (Signed)
Pt is wanting to let us know that he is doing some better but still not completely better and would like to talk with someone about a rx refill  Best number [phone]

## 2013-04-09 MED ORDER — COLCHICINE 0.6 MG PO TABS: 0.6000 mg | ORAL_TABLET | Freq: Two times a day (BID) | ORAL | Status: DC

## 2013-04-09 NOTE — Telephone Encounter (Signed)
Please refill his colchicine and have him come in to see me at 102 next week I will be here Monday Wednesday and Thursday.

## 2013-04-09 NOTE — Telephone Encounter (Signed)
Called him, he indicates he is still painful with his lower ext. Has been off it, leg foot still swollen he has been taking the colchicine. Wants to know if you want him to use the colchicine longer, or change treatment. Please advise.

## 2013-04-09 NOTE — Telephone Encounter (Signed)
Refilled colchicine. Advised patient/ also advised not to resume activity until he feels better.

## 2013-04-12 ENCOUNTER — Ambulatory Visit (INDEPENDENT_AMBULATORY_CARE_PROVIDER_SITE_OTHER): Payer: 59 | Admitting: Emergency Medicine

## 2013-04-12 VITALS — BP 130/68 | HR 69 | Temp 98.2°F | Resp 18 | Ht 73.0 in | Wt 212.4 lb

## 2013-04-12 DIAGNOSIS — J309 Allergic rhinitis, unspecified: Secondary | ICD-10-CM

## 2013-04-12 DIAGNOSIS — M7989 Other specified soft tissue disorders: Secondary | ICD-10-CM

## 2013-04-12 DIAGNOSIS — Z9109 Other allergy status, other than to drugs and biological substances: Secondary | ICD-10-CM

## 2013-04-12 DIAGNOSIS — J209 Acute bronchitis, unspecified: Secondary | ICD-10-CM

## 2013-04-12 MED ORDER — FLUTICASONE PROPIONATE 50 MCG/ACT NA SUSP: 2.0000 | Freq: Every day | NASAL | Status: DC

## 2013-04-12 MED ORDER — CEPHALEXIN 500 MG PO CAPS: 500.0000 mg | ORAL_CAPSULE | Freq: Three times a day (TID) | ORAL | Status: DC

## 2013-04-12 NOTE — Patient Instructions (Signed)
Take Zyrtec 10 mg one tablet a day usually her Flonase spray 2 puffs each side of the nose once a day. Take her antibiotics as instructed. Please go get your support hose.

## 2013-04-12 NOTE — Progress Notes (Signed)
  Subjective:    Patient ID: [name], male    DOB: 03/29/1951, 62 y.o.   MRN: [id number]  HPI patient is with a one-week history of head congestion ear discomfort scratchy throat and a cough which is now productive of a yellowish phlegm. He has a history of seasonal allergies. He is not able to take steroids because of his bipolar disease. He continues to have swelling in his left leg. He had a Doppler study done which was negative. His uric acid was elevated due to his renal disease and he has been taking colchicine for this. He had questions regarding his renal cell cancer. His last surgery was 2003. He missed his scans last year due to flareup of his bipolar disease but is scheduled for a physical in November.    Review of Systems     Objective:   Physical Exam patient is alert and cooperative he is not in any distress. His nose is congested. TMs are clear throat normal chest clear to both auscultation and percussion. He has a healed abdominal scars without abdominal tenderness. His heart has a regular rate without murmurs. He has stasis changes of both legs with 2+ edema on the left 1+ edema on the right        Assessment & Plan:  He is referred to get support stockings. He is not able to tolerate steroids. He'll be treated with Zyrtec one a day Flonase spray and coverage with cephalexin antibiotic 500  3  times a day

## 2013-04-20 ENCOUNTER — Ambulatory Visit: Payer: 59

## 2013-04-20 ENCOUNTER — Encounter: Payer: Self-pay | Admitting: Emergency Medicine

## 2013-04-20 ENCOUNTER — Ambulatory Visit (INDEPENDENT_AMBULATORY_CARE_PROVIDER_SITE_OTHER): Payer: 59 | Admitting: Emergency Medicine

## 2013-04-20 VITALS — BP 140/82 | HR 76 | Temp 98.1°F | Resp 16 | Ht 73.0 in | Wt 207.8 lb

## 2013-04-20 DIAGNOSIS — J209 Acute bronchitis, unspecified: Secondary | ICD-10-CM

## 2013-04-20 DIAGNOSIS — R05 Cough: Secondary | ICD-10-CM

## 2013-04-20 DIAGNOSIS — E291 Testicular hypofunction: Secondary | ICD-10-CM

## 2013-04-20 DIAGNOSIS — M109 Gout, unspecified: Secondary | ICD-10-CM

## 2013-04-20 LAB — POCT CBC
HCT, POC: 47.7 % (ref 43.5–53.7)
Hemoglobin: 15.7 g/dL (ref 14.1–18.1)
Lymph, poc: 1 (ref 0.6–3.4)
MCHC: 32.9 g/dL (ref 31.8–35.4)
MCV: 102.9 fL — AB (ref 80–97)
MPV: 7.6 fL (ref 0–99.8)
POC Granulocyte: 5 (ref 2–6.9)
RBC: 4.64 M/uL — AB (ref 4.69–6.13)
RDW, POC: 13.7 %

## 2013-04-20 MED ORDER — COLCHICINE 0.6 MG PO TABS: 0.6000 mg | ORAL_TABLET | Freq: Two times a day (BID) | ORAL | Status: DC

## 2013-04-20 MED ORDER — DOXYCYCLINE HYCLATE 100 MG PO CAPS: 100.0000 mg | ORAL_CAPSULE | Freq: Two times a day (BID) | ORAL | Status: DC

## 2013-04-20 NOTE — Progress Notes (Signed)
  Subjective:    Patient ID: [name], male    DOB: 04/17/1951, 62 y.o.   MRN: [id number]  HPI patient here with a cough he has been unable to clear. It is now productive of a yellowish-green phlegm. He recently took a course of cephalexin but it did not help. He is taken Depakote shots in the past and taken prednisone in the past but he has also had adverse mental issues when on prednisone. He has had worsening pain and swelling in his left great toe since stopping his colchicine. We treated him with a short dose because his white count was low    Review of Systems     Objective:   Physical Exam patient is alert and cooperative. TMs are clear nose is congested posterior pharynx is clear neck is supple. Chest exam is clear to auscultation and percussion heart regular rate no murmurs examination left great toe reveals swelling over the first MTP joint   Results for orders placed in visit on 04/20/13  POCT CBC      Result Value Range   WBC 6.5  4.6 - 10.2 K/uL   Lymph, poc 1.0  0.6 - 3.4   POC LYMPH PERCENT 15.1  10 - 50 %L   MID (cbc) 0.5  0 - 0.9   POC MID % 7.7  0 - 12 %M   POC Granulocyte 5.0  2 - 6.9   Granulocyte percent 77.2  37 - 80 %G   RBC 4.64 (*) 4.69 - 6.13 M/uL   Hemoglobin 15.7  14.1 - 18.1 g/dL   HCT, POC 82.9  56.2 - 53.7 %   MCV 102.9 (*) 80 - 97 fL   MCH, POC 33.8 (*) 27 - 31.2 pg   MCHC 32.9  31.8 - 35.4 g/dL   RDW, POC 13.0     Platelet Count, POC 195  142 - 424 K/uL   MPV 7.6  0 - 99.8 fL   [facility] reading (PRIMARY) by  Dr.Zarianna Dicarlo no acute disease seen. There is an elevation of the left hemidiaphragm       Assessment & Plan:  Referral made to Dr. Dierdre Forth to get help with his gout since he is not a candidate to be on nonsteroidals and has had difficulty with prednisone in the past and has a low white count making treatment with colchicine an issue . I did put him on colchicine 0.6  BID  for 7 days since his white count is in normal range today. He will be on  doxycycline twice a day for 10 days for his bronchitis.Marland Kitchen the computer said he is on pravastatin but patient states he is not on that medication

## 2013-04-20 NOTE — Patient Instructions (Signed)
Do not take your pravastatin for the next 2 weeks. Take colchicine twice a day. Take the doxycycline for 10 days for your bronchitis

## 2013-04-23 ENCOUNTER — Telehealth: Payer: Self-pay | Admitting: Radiology

## 2013-04-23 ENCOUNTER — Other Ambulatory Visit: Payer: Self-pay | Admitting: Emergency Medicine

## 2013-04-23 NOTE — Telephone Encounter (Signed)
Patient wants to know if you can give him a temp handicapped placard for him because he is still having pain with his leg/ foot please advise.

## 2013-04-23 NOTE — Telephone Encounter (Signed)
I will be happy to sign that

## 2013-04-23 NOTE — Telephone Encounter (Signed)
Called patient to advise it is ready for pick up.

## 2013-04-24 ENCOUNTER — Encounter: Payer: Self-pay | Admitting: Internal Medicine

## 2013-04-24 NOTE — Assessment & Plan Note (Signed)
Good control but he does not like this particular machine for his DME company. I can't tell if this reflects his mood disorder, in which case he might not like any company

## 2013-04-30 ENCOUNTER — Telehealth: Payer: Self-pay

## 2013-04-30 NOTE — Telephone Encounter (Signed)
Pt states that he still is experiencing congestion and would like to know if he could have a refill on doxycyline.  Best [phone] [facility] [address]

## 2013-05-01 NOTE — Telephone Encounter (Signed)
Patient calling again about doxycycline. States he will take his last pill tonight and needs refill. Patient of Dr Cleta Alberts. Cb# [phone]

## 2013-05-03 ENCOUNTER — Encounter: Payer: Self-pay | Admitting: Emergency Medicine

## 2013-05-03 ENCOUNTER — Telehealth: Payer: Self-pay | Admitting: Internal Medicine

## 2013-05-03 ENCOUNTER — Ambulatory Visit (INDEPENDENT_AMBULATORY_CARE_PROVIDER_SITE_OTHER): Payer: 59 | Admitting: Emergency Medicine

## 2013-05-03 ENCOUNTER — Ambulatory Visit: Payer: 59

## 2013-05-03 VITALS — BP 120/70 | HR 64 | Temp 97.7°F | Resp 16 | Ht 73.0 in | Wt 208.6 lb

## 2013-05-03 DIAGNOSIS — M542 Cervicalgia: Secondary | ICD-10-CM

## 2013-05-03 DIAGNOSIS — Z0000 Encounter for general adult medical examination without abnormal findings: Secondary | ICD-10-CM

## 2013-05-03 DIAGNOSIS — M79609 Pain in unspecified limb: Secondary | ICD-10-CM

## 2013-05-03 DIAGNOSIS — J209 Acute bronchitis, unspecified: Secondary | ICD-10-CM

## 2013-05-03 DIAGNOSIS — M25539 Pain in unspecified wrist: Secondary | ICD-10-CM

## 2013-05-03 DIAGNOSIS — F319 Bipolar disorder, unspecified: Secondary | ICD-10-CM

## 2013-05-03 DIAGNOSIS — M79672 Pain in left foot: Secondary | ICD-10-CM

## 2013-05-03 DIAGNOSIS — M25532 Pain in left wrist: Secondary | ICD-10-CM

## 2013-05-03 LAB — POCT URINALYSIS DIPSTICK
Glucose, UA: NEGATIVE
Ketones, UA: NEGATIVE
Spec Grav, UA: 1.015
Urobilinogen, UA: 0.2

## 2013-05-03 LAB — BASIC METABOLIC PANEL
BUN: 34 mg/dL — ABNORMAL HIGH (ref 6–23)
Chloride: 99 mEq/L (ref 96–112)
Glucose, Bld: 91 mg/dL (ref 70–99)
Potassium: 4.2 mEq/L (ref 3.5–5.3)
Sodium: 134 mEq/L — ABNORMAL LOW (ref 135–145)

## 2013-05-03 LAB — POCT UA - MICROSCOPIC ONLY
Bacteria, U Microscopic: NEGATIVE
Casts, Ur, LPF, POC: NEGATIVE
Crystals, Ur, HPF, POC: NEGATIVE
Yeast, UA: NEGATIVE

## 2013-05-03 LAB — URIC ACID: Uric Acid, Serum: 10.9 mg/dL — ABNORMAL HIGH (ref 4.0–7.8)

## 2013-05-03 LAB — LIPID PANEL
Cholesterol: 166 mg/dL (ref 0–200)
HDL: 40 mg/dL (ref >39)
Total CHOL/HDL Ratio: 4.2 Ratio
VLDL: 20 mg/dL (ref 0–40)

## 2013-05-03 LAB — LITHIUM LEVEL: Lithium Lvl: 0.4 mEq/L — ABNORMAL LOW (ref 0.80–1.40)

## 2013-05-03 MED ORDER — DOXYCYCLINE HYCLATE 100 MG PO CAPS: 100.0000 mg | ORAL_CAPSULE | Freq: Two times a day (BID) | ORAL | Status: DC

## 2013-05-03 NOTE — Telephone Encounter (Signed)
We cannot refill antibiotics.  Needs to RTC if not improved.  Recommend Mucinex and increased fluids.

## 2013-05-03 NOTE — Progress Notes (Deleted)
  Subjective:    Patient ID: [name], male    DOB: 17-Nov-1950, 62 y.o.   MRN: [id number]  HPI    Review of Systems     Objective:   Physical Exam        Assessment & Plan:

## 2013-05-03 NOTE — Telephone Encounter (Signed)
I called and spoke with pt. He was placed on auto CPAP for titration. He reports this was sent to [place] [facility]. He is calling and checking on the results. Please advise if these have been received thanks

## 2013-05-03 NOTE — Progress Notes (Signed)
@[email or web address] @  Patient ID: [name] MRN: [id number], DOB: 09-07-1950 62 y.o. Date of Encounter: 05/03/2013, 9:56 AM  Primary Physician: Lucilla Edin, MD  Chief Complaint: Physical (CPE)  HPI: 62 y.o. y/o male with history noted below here for CPE.  Doing well. No issues/complaints.  Review of Systems: Consitutional: No fever, chills, fatigue, night sweats, lymphadenopathy, weight is of some recently Eyes: No visual changes, eye redness, or discharge. ENT/Mouth: Ears: No otalgia, tinnitus, hearing loss, discharge. Nose: No congestion, rhinorrhea, sinus pain, or epistaxis. Throat: No sore throat, post nasal drip, or teeth pain. Cardiovascular: No CP, palpitations, diaphoresis, DOE, edema, orthopnea, PND. Respiratory: No cough, hemoptysis, SOB, or wheezing. Gastrointestinal: No anorexia, dysphagia, reflux, pain, nausea, vomiting, hematemesis, diarrhea, constipation, BRBPR, or melena. Genitourinary: No dysuria, frequency, urgency, hematuria, incontinence, nocturia, decreased urinary stream, discharge, impotence, or testicular pain/masses. Musculoskeletal: He has a history of gout with persistent left foot pain. He also has numbness in his left arm when he plays his guitar for long periods of time.  Skin: Patient has multiple small pimple-like areas he is concerned about he also has a cystic area in his right groin he would like checked. He also would like a known blue nevus on the back of his flank checked Neurological: No headache, dizziness, syncope, seizures, tremors, memory loss, coordination problems, or paresthesias. Psychological: Patient under treatment for his bipolar disease. He sees Dr. Brendia Sacks regularly. His depression has been under very good control. Endocrine: No fatigue, polydipsia, polyphagia, polyuria, or known diabetes. He continues to get his testosterone shots on a regular basis and feels better with regard to this All other systems were reviewed and are otherwise  negative.  Past Medical History  Diagnosis Date  . Renal cell carcinoma     chemo and xrt--lost rt kidney  . Childhood asthma   . Sinusitis   . Cellulitis     after left foot surgery  . Osteoarthritis   . OSA on CPAP     NPSG 05-08-07 AHI 63.1/hr,desat t0 80%/loud snoring  . Bipolar 1 disorder   . Hyperlipemia   . Mental disorder   . Depression   . GERD (gastroesophageal reflux disease)   . Hypertension      Past Surgical History  Procedure Laterality Date  . Foot surgery      left foot toes,   . Bunionectomy    . 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 Cholecystectomy    . Colon surgery    . Kidney surgery  1998    Rt Kidney Removal     Home Meds:  Prior to Admission medications   Medication Sig Start Date End Date Taking? Authorizing Provider  amLODipine (NORVASC) 10 MG tablet TAKE 1 TABLET BY MOUTH EVERY DAY 03/12/13  Yes Heather M Marte, PA-C  aspirin EC 81 MG tablet Take 1 tablet (81 mg total) by mouth daily. 06/18/12  Yes Laurey Morale, MD  buPROPion [facility] SR) 200 MG 12 hr tablet Take 200 mg by mouth 2 (two) times daily.   Yes Historical Provider, MD  Coenzyme Q10 (COQ10) 100 MG CAPS Take 1 capsule by mouth daily.   Yes Historical Provider, MD  colchicine 0.6 MG tablet Take 1 tablet (0.6 mg total) by mouth 2 (two) times daily. 04/20/13  Yes Collene Gobble, MD  [facility] STOOL SOFTENER 100 MG capsule TAKE 2 TO 3 CAPSULES BY MOUTH EVERY DAY 04/23/13  Yes Nelva Nay, PA-C  divalproex (DEPAKOTE ER)  250 MG 24 hr tablet Take 750 mg by mouth daily.   Yes Historical Provider, MD  fluticasone (FLONASE) 50 MCG/ACT nasal spray Place 2 sprays into the nose daily. 04/12/13  Yes Collene Gobble, MD  levothyroxine (SYNTHROID, LEVOTHROID) 50 MCG tablet TAKE 1 TABLET BY MOUTH EVERY DAY 04/03/13  Yes Godfrey Pick, PA-C  lithium carbonate (LITHOBID) 300 MG CR tablet Take 1 tablet (300 mg total) by mouth every evening. 09/09/11  Yes Cl[place] Cyndie Mull, MD  Multiple Vitamin  (MULTIVITAMIN WITH MINERALS) TABS Take 1 tablet by mouth daily.   Yes Historical Provider, MD  Omega-3 Fatty Acids (FISH OIL) 1000 MG CAPS Take 1 capsule by mouth daily.   Yes Historical Provider, MD  OVER THE COUNTER MEDICATION Acai liquid once to twice daily 2oz   Yes Historical Provider, MD  primidone (MYSOLINE) 50 MG tablet Take 500 mg by mouth at bedtime.    Yes Historical Provider, MD  temazepam (RESTORIL) 15 MG capsule Take 15 mg by mouth at bedtime as needed. For sleep.   Yes Historical Provider, MD  testosterone enanthate (DELATESTRYL) 200 MG/ML injection INJECT 1 MLS INTO THE MUSCLE EVERY 14 DAYS AS DIRECTED 03/14/13  Yes Collene Gobble, MD  Bee Pollen 550 MG CAPS Take 2 capsules by mouth 2 (two) times daily.    Historical Provider, MD  doxycycline (VIBRAMYCIN) 100 MG capsule Take 1 capsule (100 mg total) by mouth 2 (two) times daily. 04/20/13   Collene Gobble, MD  lactose free nutrition (BOOST) LIQD Take 237 mLs by mouth daily.    Historical Provider, MD    Allergies: No Known Allergies  History   Social History  . Marital Status: Married    Spouse Name: N/A    Number of Children: 0  . Years of Education: N/A   Occupational History  . [employer]-product developer    Social History Main Topics  . Smoking status: Never Smoker   . Smokeless tobacco: Never Used  . Alcohol Use: Yes     Comment: occasional b eer  . Drug Use: No  . Sexual Activity: Not Currently   Other Topics Concern  . Not on file   Social History Narrative  . No narrative on file    Family History  Problem Relation Age of Onset  . Hypertension    . Sleep apnea    . Emphysema    . Prostate cancer    . Arthritis Father   . Colon cancer Father     Physical Exam: Blood pressure 120/70, pulse 64, temperature 97.7 F (36.5 C), temperature source Oral, resp. rate 16, height 6\' 1"  (1.854 m), weight 208 lb 9.6 oz (94.62 kg), SpO2 97.00%.  General: Well developed, well nourished, in no acute  distress. HEENT: Normocephalic, atraumatic. Conjunctiva pink, sclera non-icteric. Pupils 2 mm constricting to 1 mm, round, regular, and equally reactive to light and accomodation. EOMI. Internal auditory canal clear. TMs with good cone of light and without pathology. Nasal mucosa pink. Nares are without discharge. No sinus tenderness. Oral mucosa pink. Dentition . Pharynx without exudate.   Neck: Supple. Trachea midline. No thyromegaly. Full ROM. No lymphadenopathy. Lungs: Clear to auscultation bilaterally without wheezes, rales, or rhonchi. Breathing is of normal effort and unlabored. Cardiovascular: RRR with S1 S2. No murmurs, rubs, or gallops appreciated. Distal pulses 2+ symmetrically. No carotid or abdominal bruits. Abdomen: Soft, non-tender, non-distended with normoactive bowel sounds. No hepatosplenomegaly or masses. No rebound/guarding. No CVA tenderness. Without hernias. There  is a large scar of the upper abdomen to Rectal:  Genitourinary:   circumcised male. No penile lesions. Testes descended bilaterally, and smooth without tenderness or masses. Both testicles are atrophic  Musculoskeletal: Full range of motion and 5/5 strength throughout. Without swelling, atrophy, tenderness, crepitus, or warmth. Extremities without clubbing, cyanosis, or edema. Calves supple. There is tenderness over the first MTP joint and over the dorsum of the foot Skin: Warm and moist without erythema, ecchymosis, wounds, or rash there is a 3 x 4 mm blue nevus over the flank area. There is a 1/2 cm cystic area in the right groin which appears to be attached to the skin. Neuro: A+Ox3. CN II-XII grossly intact. Moves all extremities spontaneously. Full sensation throughout. Normal gait. DTR 2+ throughout upper and lower extremities. Finger to nose intact. Psych:  Responds to questions appropriately with a normal affect.   Results for orders placed in visit on 05/03/13  POCT URINALYSIS DIPSTICK      Result Value Range    Color, UA yellow     Clarity, UA clear     Glucose, UA neg     Bilirubin, UA neg     Ketones, UA neg     Spec Grav, UA 1.015     Blood, UA trace     pH, UA 7.0     Protein, UA neg     Urobilinogen, UA 0.2     Nitrite, UA neg     Leukocytes, UA Trace    POCT UA - MICROSCOPIC ONLY      Result Value Range   WBC, Ur, HPF, POC 0-2     RBC, urine, microscopic 0-3     Bacteria, U Microscopic neg     Mucus, UA neg     Epithelial cells, urine per micros 0-2     Crystals, Ur, HPF, POC neg     Casts, Ur, LPF, POC neg     Yeast, UA neg    [facility] reading (PRIMARY) by  Dr. Cleta Alberts wrist films are normal except for mild arthritic changes. Cervical spine film showed degenerative changes C6 and C5    Studies: Routine labs were done to include a lithium level.       Assessment/Plan:  62 y.o. y/o stable at the present time. His routine labs were done. He is going to see the rheumatologist on Friday to get help with his gout. He will continue his orthopedic evaluation. He was found to have cervical disc disease on C-spine films and I suspect this is the source of his numbness left arm. He has multiple small skin lesions consistent with folliculitis and assist in his right groin and plans to see the dermatologist about this. We'll refill his doxycycline and see if this will help .  -  Signed, Earl Lites, MD 05/03/2013 9:56 AM

## 2013-05-03 NOTE — Telephone Encounter (Signed)
Called him. To advise he will use the mucinex. He is taking the doxycycline for other problems.

## 2013-05-03 NOTE — Telephone Encounter (Signed)
lmomtcb x1 for pt 

## 2013-05-03 NOTE — Telephone Encounter (Signed)
Returning call can be reached at [phone].Raylene Everts

## 2013-05-04 ENCOUNTER — Ambulatory Visit (INDEPENDENT_AMBULATORY_CARE_PROVIDER_SITE_OTHER): Payer: 59 | Admitting: Family Medicine

## 2013-05-04 DIAGNOSIS — E291 Testicular hypofunction: Secondary | ICD-10-CM

## 2013-05-04 NOTE — Telephone Encounter (Signed)
CPAP download from [facility] 10/1-11/9/14 inidcated best control at 15, but current pressure at 14 is good enough. He had previously felt 15 was too high. Ok to leave at 45.

## 2013-05-04 NOTE — Telephone Encounter (Signed)
Spoke with patient-he is aware of results from [facility] and we will leave pressure at 14. Pt states he feels that he may need a different mask; he is aware that Christoper Allegra can help him to find a better fitting mask-if an order is needed then Christoper Allegra will let us know.

## 2013-05-17 ENCOUNTER — Telehealth: Payer: Self-pay

## 2013-05-17 NOTE — Telephone Encounter (Signed)
Patient calling to inform Dr. Cleta Alberts that his referral for gout and rheumatoid lab results came back negative and wanted to ask him what his next suggestions are. Please advise.   Best: [phone]

## 2013-05-18 ENCOUNTER — Ambulatory Visit (INDEPENDENT_AMBULATORY_CARE_PROVIDER_SITE_OTHER): Payer: 59 | Admitting: *Deleted

## 2013-05-18 ENCOUNTER — Encounter: Payer: Self-pay | Admitting: *Deleted

## 2013-05-18 VITALS — BP 152/84 | Temp 97.5°F

## 2013-05-18 DIAGNOSIS — E291 Testicular hypofunction: Secondary | ICD-10-CM

## 2013-05-18 NOTE — Progress Notes (Signed)
   Subjective:    Patient ID: [name], male    DOB: 05-01-1951, 62 y.o.   MRN: [id number]  HPI  Patient here for testosterone injection only.  Review of Systems     Objective:   Physical Exam        Assessment & Plan:

## 2013-05-18 NOTE — Telephone Encounter (Signed)
Called him to advise. Left message for him to call me back  

## 2013-05-18 NOTE — Telephone Encounter (Signed)
[name] has advised patient we need the notes from rheumatology.

## 2013-05-18 NOTE — Telephone Encounter (Signed)
[name] and see if he is arty seen the rheumatologist and was told he does not have gout. If that is the case, rheumatologist and have him fax me a copy of his records. Once I review the records I can give [name] my recommendations.

## 2013-05-21 ENCOUNTER — Telehealth: Payer: Self-pay

## 2013-05-21 NOTE — Telephone Encounter (Signed)
Copy of medicines sent to [facility]

## 2013-05-21 NOTE — Telephone Encounter (Signed)
Patient was referred to [facility] by Dr. Cleta Alberts Appointment on Monday at 8:20 am  Patient needs his medication list sent to them.   [phone]

## 2013-06-01 ENCOUNTER — Ambulatory Visit (INDEPENDENT_AMBULATORY_CARE_PROVIDER_SITE_OTHER): Payer: 59 | Admitting: Family Medicine

## 2013-06-01 DIAGNOSIS — E291 Testicular hypofunction: Secondary | ICD-10-CM

## 2013-06-08 ENCOUNTER — Telehealth: Payer: Self-pay | Admitting: Internal Medicine

## 2013-06-08 DIAGNOSIS — G4733 Obstructive sleep apnea (adult) (pediatric): Secondary | ICD-10-CM

## 2013-06-08 NOTE — Telephone Encounter (Signed)
I spoke with pt. He called [facility] and his CPAP machine is set at 8 CM. His pressure was never changed according to CDY pressure recs from 04/2013 phone note. I advised pt I will send this order to [facility] and apologized to him. Nothing further needed and order placed

## 2013-06-15 ENCOUNTER — Ambulatory Visit (INDEPENDENT_AMBULATORY_CARE_PROVIDER_SITE_OTHER): Payer: 59 | Admitting: *Deleted

## 2013-06-15 DIAGNOSIS — E291 Testicular hypofunction: Secondary | ICD-10-CM

## 2013-06-15 NOTE — Progress Notes (Signed)
   Subjective:    Patient ID: [name], male    DOB: [birth year], 62 y.o.   MRN: [id number]  HPI patient in for testosterone injection only.    Review of Systems     Objective:   Physical Exam        Assessment & Plan:

## 2013-06-16 ENCOUNTER — Telehealth: Payer: Self-pay

## 2013-06-16 NOTE — Telephone Encounter (Signed)
Dr.Daub, Pt states that his kidney cancer has reoccurred, he is set to have surgery in January 26 th, 2015. He would also like to know if you want him to hold off on seeing kidney a kidney doctor since he has been diagnosed with kidney cancer. Pt would like to also inquire about whether or not carpal tunnel requires surgery or rehab? [phone]

## 2013-06-17 NOTE — Telephone Encounter (Signed)
I called and spoke with the patient. We can hold off on him seeing a kidney specialist at the present time. He was instructed not to have his carpal tunnel surgery done at the present time. We do not need to call the patient back I have already spoken with him

## 2013-06-23 ENCOUNTER — Telehealth: Payer: Self-pay

## 2013-06-23 NOTE — Telephone Encounter (Signed)
Dr.Daub, Pt would like to inform you that he will having surgery on June 30, 2013 on his Kidney. He also wanted me to let you know that the surgeons name is Izora Ribas. Best# [phone]

## 2013-06-24 ENCOUNTER — Ambulatory Visit (INDEPENDENT_AMBULATORY_CARE_PROVIDER_SITE_OTHER): Payer: Self-pay | Admitting: General Surgery

## 2013-06-28 ENCOUNTER — Other Ambulatory Visit: Payer: Self-pay | Admitting: Emergency Medicine

## 2013-06-29 ENCOUNTER — Telehealth: Payer: Self-pay

## 2013-06-29 ENCOUNTER — Telehealth: Payer: Self-pay | Admitting: Family Medicine

## 2013-06-29 NOTE — Telephone Encounter (Signed)
Advised we hold off on testosterone shot today because of his surgery tomorrow and fear of clots. As soon as he is cleared by his doctor I will restart his testosterone injections.Already called.

## 2013-06-29 NOTE — Telephone Encounter (Signed)
Dr Everlene Farrier, the only thing we can do is reorder another Testosterone vial but his insurance may not cover it, any suggestions?

## 2013-06-29 NOTE — Telephone Encounter (Signed)
Pt called and states he was to come in today for a testosterone shot, but states he has misplaced his medicine. Pt states needs shot today, because he is being admitted to the hospital tomorrow Pt wants to know what he should do

## 2013-06-29 NOTE — Telephone Encounter (Signed)
Spoke with patient and let him know that Dr. Everlene Farrier does not want him to take his injection today.  He will prescribe the medicine as soon as the surgeon clears him to take the medicine.

## 2013-07-02 DIAGNOSIS — Z0271 Encounter for disability determination: Secondary | ICD-10-CM

## 2013-07-16 ENCOUNTER — Telehealth: Payer: Self-pay

## 2013-07-16 NOTE — Telephone Encounter (Signed)
Patient calling to ask Dr. Everlene Farrier some questions and to give an update on his cancer. Patient says he is currently at [facility] and was admitted. He was told he will be transported to [facility] soon.  Patient wanted to speak to Dr. Everlene Farrier and ask him some questions.   Call back: [phone]

## 2013-07-17 NOTE — Telephone Encounter (Signed)
Called and spoke with wife. Msg. Left on his phone

## 2013-07-20 ENCOUNTER — Telehealth: Payer: Self-pay

## 2013-07-20 NOTE — Telephone Encounter (Signed)
PATIENTS WIFE IS CALLING TO ASK DR DAUB ABOUT SOME REHAB PLACES  [address]  AND [address] [place]  [phone]

## 2013-07-21 NOTE — Telephone Encounter (Signed)
I called and spoke with Mrs. Wachtel and told her I did not know two of the centers but the [facility]  care center has a good reputation

## 2013-07-21 NOTE — Telephone Encounter (Signed)
Dr Everlene Farrier do you know about any of these places? Please advise.

## 2013-07-25 ENCOUNTER — Inpatient Hospital Stay (HOSPITAL_COMMUNITY)
Admission: EM | Admit: 2013-07-25 | Discharge: 2013-08-03 | DRG: 101 | Disposition: A | Discharge status: Skilled Nursing Facility | Payer: 59 | Attending: Internal Medicine | Admitting: Internal Medicine

## 2013-07-25 ENCOUNTER — Emergency Department (HOSPITAL_COMMUNITY): Payer: 59

## 2013-07-25 ENCOUNTER — Encounter (HOSPITAL_COMMUNITY): Payer: Self-pay | Admitting: Emergency Medicine

## 2013-07-25 DIAGNOSIS — R5381 Other malaise: Secondary | ICD-10-CM

## 2013-07-25 DIAGNOSIS — R609 Edema, unspecified: Secondary | ICD-10-CM | POA: Diagnosis present

## 2013-07-25 DIAGNOSIS — Z923 Personal history of irradiation: Secondary | ICD-10-CM

## 2013-07-25 DIAGNOSIS — R569 Unspecified convulsions: Principal | ICD-10-CM

## 2013-07-25 DIAGNOSIS — K219 Gastro-esophageal reflux disease without esophagitis: Secondary | ICD-10-CM | POA: Diagnosis present

## 2013-07-25 DIAGNOSIS — F3160 Bipolar disorder, current episode mixed, unspecified: Secondary | ICD-10-CM

## 2013-07-25 DIAGNOSIS — E875 Hyperkalemia: Secondary | ICD-10-CM | POA: Diagnosis present

## 2013-07-25 DIAGNOSIS — I82409 Acute embolism and thrombosis of unspecified deep veins of unspecified lower extremity: Secondary | ICD-10-CM | POA: Diagnosis present

## 2013-07-25 DIAGNOSIS — R600 Localized edema: Secondary | ICD-10-CM

## 2013-07-25 DIAGNOSIS — J45909 Unspecified asthma, uncomplicated: Secondary | ICD-10-CM

## 2013-07-25 DIAGNOSIS — I519 Heart disease, unspecified: Secondary | ICD-10-CM | POA: Diagnosis present

## 2013-07-25 DIAGNOSIS — E872 Acidosis, unspecified: Secondary | ICD-10-CM | POA: Diagnosis present

## 2013-07-25 DIAGNOSIS — E039 Hypothyroidism, unspecified: Secondary | ICD-10-CM | POA: Diagnosis present

## 2013-07-25 DIAGNOSIS — R4182 Altered mental status, unspecified: Secondary | ICD-10-CM

## 2013-07-25 DIAGNOSIS — K5900 Constipation, unspecified: Secondary | ICD-10-CM

## 2013-07-25 DIAGNOSIS — E785 Hyperlipidemia, unspecified: Secondary | ICD-10-CM

## 2013-07-25 DIAGNOSIS — N183 Chronic kidney disease, stage 3 unspecified: Secondary | ICD-10-CM

## 2013-07-25 DIAGNOSIS — E291 Testicular hypofunction: Secondary | ICD-10-CM

## 2013-07-25 DIAGNOSIS — M7750 Other enthesopathy of unspecified foot: Secondary | ICD-10-CM

## 2013-07-25 DIAGNOSIS — C649 Malignant neoplasm of unspecified kidney, except renal pelvis: Secondary | ICD-10-CM

## 2013-07-25 DIAGNOSIS — Z9221 Personal history of antineoplastic chemotherapy: Secondary | ICD-10-CM

## 2013-07-25 DIAGNOSIS — Z8553 Personal history of malignant neoplasm of renal pelvis: Secondary | ICD-10-CM

## 2013-07-25 DIAGNOSIS — F319 Bipolar disorder, unspecified: Secondary | ICD-10-CM | POA: Diagnosis present

## 2013-07-25 DIAGNOSIS — Z905 Acquired absence of kidney: Secondary | ICD-10-CM

## 2013-07-25 DIAGNOSIS — I82403 Acute embolism and thrombosis of unspecified deep veins of lower extremity, bilateral: Secondary | ICD-10-CM

## 2013-07-25 DIAGNOSIS — I129 Hypertensive chronic kidney disease with stage 1 through stage 4 chronic kidney disease, or unspecified chronic kidney disease: Secondary | ICD-10-CM | POA: Diagnosis present

## 2013-07-25 DIAGNOSIS — R0989 Other specified symptoms and signs involving the circulatory and respiratory systems: Secondary | ICD-10-CM

## 2013-07-25 DIAGNOSIS — E663 Overweight: Secondary | ICD-10-CM

## 2013-07-25 DIAGNOSIS — R0609 Other forms of dyspnea: Secondary | ICD-10-CM

## 2013-07-25 DIAGNOSIS — D638 Anemia in other chronic diseases classified elsewhere: Secondary | ICD-10-CM

## 2013-07-25 DIAGNOSIS — Z79899 Other long term (current) drug therapy: Secondary | ICD-10-CM

## 2013-07-25 DIAGNOSIS — G4733 Obstructive sleep apnea (adult) (pediatric): Secondary | ICD-10-CM

## 2013-07-25 LAB — CBC WITH DIFFERENTIAL/PLATELET
BASOS ABS: 0 10*3/uL (ref 0.0–0.1)
Basophils Relative: 0 % (ref 0–1)
Eosinophils Absolute: 0.6 10*3/uL (ref 0.0–0.7)
Eosinophils Relative: 9 % — ABNORMAL HIGH (ref 0–5)
HEMATOCRIT: 29.8 % — AB (ref 39.0–52.0)
HEMOGLOBIN: 9.6 g/dL — AB (ref 13.0–17.0)
LYMPHS PCT: 6 % — AB (ref 12–46)
Lymphs Abs: 0.4 10*3/uL — ABNORMAL LOW (ref 0.7–4.0)
MCH: 31.4 pg (ref 26.0–34.0)
MCHC: 32.2 g/dL (ref 30.0–36.0)
MCV: 97.4 fL (ref 78.0–100.0)
MONOS PCT: 7 % (ref 3–12)
Monocytes Absolute: 0.5 10*3/uL (ref 0.1–1.0)
Neutro Abs: 5.9 10*3/uL (ref 1.7–7.7)
Neutrophils Relative %: 78 % — ABNORMAL HIGH (ref 43–77)
Platelets: 378 10*3/uL (ref 150–400)
RBC: 3.06 MIL/uL — ABNORMAL LOW (ref 4.22–5.81)
RDW: 15.2 % (ref 11.5–15.5)
WBC: 7.6 10*3/uL (ref 4.0–10.5)

## 2013-07-25 LAB — COMPREHENSIVE METABOLIC PANEL
ALK PHOS: 99 U/L (ref 39–117)
ALT: 32 U/L (ref 0–53)
AST: 30 U/L (ref 0–37)
Albumin: 2.7 g/dL — ABNORMAL LOW (ref 3.5–5.2)
BUN: 30 mg/dL — ABNORMAL HIGH (ref 6–23)
CHLORIDE: 102 meq/L (ref 96–112)
CO2: 22 mEq/L (ref 19–32)
Calcium: 8.9 mg/dL (ref 8.4–10.5)
Creatinine, Ser: 2.54 mg/dL — ABNORMAL HIGH (ref 0.50–1.35)
GFR calc Af Amer: 30 mL/min — ABNORMAL LOW (ref >90)
GFR calc non Af Amer: 26 mL/min — ABNORMAL LOW (ref >90)
Glucose, Bld: 140 mg/dL — ABNORMAL HIGH (ref 70–99)
Potassium: 5.5 mEq/L — ABNORMAL HIGH (ref 3.7–5.3)
Sodium: 142 mEq/L (ref 137–147)
Total Protein: 6.6 g/dL (ref 6.0–8.3)

## 2013-07-25 LAB — POCT I-STAT, CHEM 8
BUN: 28 mg/dL — ABNORMAL HIGH (ref 6–23)
Calcium, Ion: 1.19 mmol/L (ref 1.13–1.30)
Chloride: 102 mEq/L (ref 96–112)
Creatinine, Ser: 2.5 mg/dL — ABNORMAL HIGH (ref 0.50–1.35)
Glucose, Bld: 130 mg/dL — ABNORMAL HIGH (ref 70–99)
HCT: 35 % — ABNORMAL LOW (ref 39.0–52.0)
Hemoglobin: 11.9 g/dL — ABNORMAL LOW (ref 13.0–17.0)
POTASSIUM: 4.8 meq/L (ref 3.7–5.3)
SODIUM: 140 meq/L (ref 137–147)
TCO2: 23 mmol/L (ref 0–100)

## 2013-07-25 LAB — AMMONIA: Ammonia: 26 umol/L (ref 11–60)

## 2013-07-25 LAB — MAGNESIUM: Magnesium: 3 mg/dL — ABNORMAL HIGH (ref 1.5–2.5)

## 2013-07-25 LAB — LITHIUM LEVEL: Lithium Lvl: <0.25 mEq/L — ABNORMAL LOW (ref 0.80–1.40)

## 2013-07-25 LAB — LACTIC ACID, PLASMA: Lactic Acid, Venous: 6.7 mmol/L — ABNORMAL HIGH (ref 0.5–2.2)

## 2013-07-25 LAB — GLUCOSE, CAPILLARY: GLUCOSE-CAPILLARY: 130 mg/dL — AB (ref 70–99)

## 2013-07-25 LAB — VALPROIC ACID LEVEL: Valproic Acid Lvl: 22.4 ug/mL — ABNORMAL LOW (ref 50.0–100.0)

## 2013-07-25 MED ORDER — SODIUM CHLORIDE 0.9 % IV BOLUS (SEPSIS)
500.0000 mL | Freq: Once | INTRAVENOUS | Status: AC
  Administered 2013-07-26: 500 mL via INTRAVENOUS

## 2013-07-25 MED ORDER — LEVETIRACETAM 500 MG PO TABS: 500.0000 mg | ORAL_TABLET | Freq: Two times a day (BID) | ORAL | Status: DC

## 2013-07-25 MED ORDER — LORAZEPAM 2 MG/ML IJ SOLN
INTRAMUSCULAR | Status: AC
  Administered 2013-07-25: 2 mg
  Filled 2013-07-25: qty 1

## 2013-07-25 MED ORDER — SODIUM CHLORIDE 0.9 % IV SOLN
1500.0000 mg | Freq: Two times a day (BID) | INTRAVENOUS | Status: DC
  Filled 2013-07-25: qty 15

## 2013-07-25 MED ORDER — SODIUM CHLORIDE 0.9 % IV SOLN: 20.0000 mg/kg | Freq: Once | INTRAVENOUS | Status: DC

## 2013-07-25 MED ORDER — DIVALPROEX SODIUM ER 500 MG PO TB24
750.0000 mg | ORAL_TABLET | Freq: Two times a day (BID) | ORAL | Status: DC
  Administered 2013-07-26 – 2013-08-03 (×17): 750 mg via ORAL
  Filled 2013-07-25 (×19): qty 1

## 2013-07-25 MED ORDER — SODIUM CHLORIDE 0.9 % IV SOLN
1500.0000 mg | INTRAVENOUS | Status: AC
  Administered 2013-07-25: 1500 mg via INTRAVENOUS
  Filled 2013-07-25: qty 15

## 2013-07-25 NOTE — ED Notes (Signed)
Pt presenting with left-sided weakness, left-sided facial droop after seizure.  LSN [id number].

## 2013-07-25 NOTE — ED Notes (Addendum)
Pt with witnessed seizure by EMT.  Seizure lasted about 2 minutes.  RN at bedside.  1 mg of Ativan given.  Pt with left-sided gaze after seizure.  Pt currently speaking in clear sentences.  Alert and oriented x 4.  CBG 130.

## 2013-07-25 NOTE — ED Provider Notes (Signed)
CSN: [id number]     Arrival date & time 07/25/13  2027 History   First MD Initiated Contact with Patient 07/25/13 2032     Chief Complaint  Patient presents with  . Seizures    HPI: Mr. [name] is a 63 yo M with history of metastatic renal cell carcinoma, s/p removal of liver metastasis, bipolar disorder, chronic kidney injury, who presents for evaluation of seizures. He had three seizures prior to arrival in the ED. Patient is aware of one episode, he remembers his arms involuntarily being raised above his head, his head then turning to the left. This episode lasted for less than 2 minutes. He does not recall any further episodes. EMS states he had two additional episodes prior to their arrival. The EMT witnessed one seizure when his head turned to the left and he had generalized, tonic-clonic activity of upper extremities. He was given 5 mg of Versed by EMS which resolved seizure. On arrival he is post-ictal, once more awake noted to have left sided weakness. Seizures occurred over a period of 90 minutes. Once he was alert to baseline, he had no complaints.    Past Medical History  Diagnosis Date  . Renal cell carcinoma     chemo and xrt--lost rt kidney  . Childhood asthma   . Sinusitis   . Cellulitis     after left foot surgery  . Osteoarthritis   . OSA on CPAP     NPSG 05-08-07 AHI 63.1/hr,desat t0 80%/loud snoring  . Bipolar 1 disorder   . Hyperlipemia   . Mental disorder   . Depression   . GERD (gastroesophageal reflux disease)   . Hypertension   . Allergy   . Anxiety   . Cataract   . Neuromuscular disorder   . Osteoporosis   . Thyroid disease    Past Surgical History  Procedure Laterality Date  . Foot surgery      left foot toes,   . Bunionectomy    . 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 Cholecystectomy    . Colon surgery    . Kidney surgery  1998    Rt Kidney Removal    Family History  Problem Relation Age of Onset  . Hypertension    . Sleep apnea    .  Emphysema    . Prostate cancer    . Arthritis Father   . Colon cancer Father    History  Substance Use Topics  . Smoking status: Never Smoker   . Smokeless tobacco: Never Used  . Alcohol Use: Yes     Comment: occasional beer    Review of Systems  Constitutional: Positive for fatigue. Negative for fever, chills and appetite change.  Eyes: Negative for photophobia and visual disturbance.  Respiratory: Negative for cough and shortness of breath.   Cardiovascular: Positive for leg swelling. Negative for chest pain.  Gastrointestinal: Negative for nausea, vomiting, abdominal pain, diarrhea and constipation.  Genitourinary: Negative for dysuria, frequency and decreased urine volume.  Musculoskeletal: Negative for arthralgias, back pain, gait problem and myalgias.  Skin: Positive for wound. Negative for color change.  Neurological: Positive for seizures and weakness (left sided). Negative for dizziness, syncope and headaches.  Psychiatric/Behavioral: Negative for confusion and agitation.  All other systems reviewed and are negative.    Allergies  Review of patient's allergies indicates no known allergies.  Home Medications   Current Outpatient Rx  Name  Route  Sig  Dispense  Refill  .  amLODipine (NORVASC) 10 MG tablet   Oral   Take 10 mg by mouth daily.         Marland Kitchen buPROPion (WELLBUTRIN SR) 200 MG 12 hr tablet   Oral   Take 200 mg by mouth 2 (two) times daily.         . divalproex (DEPAKOTE ER) 250 MG 24 hr tablet   Oral   Take 750 mg by mouth daily.         Marland Kitchen docusate sodium (COLACE) 100 MG capsule   Oral   Take 100 mg by mouth 2 (two) times daily.         Marland Kitchen levothyroxine (SYNTHROID, LEVOTHROID) 50 MCG tablet      TAKE 1 TABLET BY MOUTH EVERY DAY   90 tablet   0   . levothyroxine (SYNTHROID, LEVOTHROID) 50 MCG tablet   Oral   Take 50 mcg by mouth daily before breakfast.         . lithium carbonate (LITHOBID) 300 MG CR tablet   Oral   Take 1 tablet (300  mg total) by mouth every evening.   20 tablet   0   . Omega-3 Fatty Acids (FISH OIL) 1000 MG CAPS   Oral   Take 1 capsule by mouth daily.         . pantoprazole (PROTONIX) 40 MG tablet   Oral   Take 40 mg by mouth daily.          BP 114/82  Pulse 81  Resp 15  SpO2 100% Physical Exam  Nursing note and vitals reviewed. Constitutional: He is oriented to person, place, and time. He appears distressed.  Chronically ill appearing, middle age male. Sitting up in bed, appears comfortable.   HENT:  Head: Normocephalic and atraumatic.  Mouth/Throat: Oropharynx is clear and moist. Mucous membranes are dry.  Eyes: Conjunctivae and EOM are normal. Pupils are equal, round, and reactive to light.  Neck: Normal range of motion. Neck supple. JVD present.  Cardiovascular: Normal rate, regular rhythm, normal heart sounds and intact distal pulses.   Pulmonary/Chest: Effort normal. No respiratory distress. He has decreased breath sounds (at bilateral bases).  Abdominal: Soft. Bowel sounds are normal. There is no tenderness. There is no rebound and no guarding.  Large surgical wound to RUQ, normal healing, no significant redness.  Abdominal drain in RLQ, no signs of infection.   Musculoskeletal: Normal range of motion. He exhibits edema (3+ LE edema from ankles up to abdomen. ). He exhibits no tenderness.  Neurological: He is alert and oriented to person, place, and time. No cranial nerve deficit. Coordination normal. GCS eye subscore is 4. GCS verbal subscore is 5. GCS motor subscore is 6.  Left sided facial droop, otherwise CN 3-12 intact. Strength 5/5 in both upper extremities. Strength 4/5 in lower extremities. Normal sensation and reflexes. Upper extremity tremor noted. Gait not tested.   Skin: Skin is warm and dry. No rash noted.  Psychiatric: He has a normal mood and affect. His behavior is normal.    ED Course  Procedures (including critical care time) Labs Review Labs Reviewed  CBC  WITH DIFFERENTIAL - Abnormal; Notable for the following:    RBC 3.06 (*)    Hemoglobin 9.6 (*)    HCT 29.8 (*)    Neutrophils Relative % 78 (*)    Lymphocytes Relative 6 (*)    Lymphs Abs 0.4 (*)    Eosinophils Relative 9 (*)    All other components  within normal limits  CULTURE, BLOOD (ROUTINE X 2)  CULTURE, BLOOD (ROUTINE X 2)  COMPREHENSIVE METABOLIC PANEL  LACTIC ACID, PLASMA  URINALYSIS, ROUTINE W REFLEX MICROSCOPIC  AMMONIA  LITHIUM LEVEL  VALPROIC ACID LEVEL   Imaging Review Ct Head Wo Contrast  07/25/2013   CLINICAL DATA:  Seizures.  Left-sided weakness.  EXAM: CT HEAD WITHOUT CONTRAST  TECHNIQUE: Contiguous axial images were obtained from the base of the skull through the vertex without intravenous contrast.  COMPARISON:  CT HEAD W/O CM dated 09/06/2011  FINDINGS: Mild cerebral atrophy, similar to prior study. No acute intracranial abnormality. Specifically, no hemorrhage, hydrocephalus, mass lesion, acute infarction, or significant intracranial injury. No acute calvarial abnormality. Visualized paranasal sinuses and mastoids clear. Orbital soft tissues unremarkable.  IMPRESSION: No acute intracranial abnormality.   Electronically Signed   By: Rolm Baptise M.D.   On: 07/25/2013 21:14   Dg Chest Portable 1 View  07/25/2013   CLINICAL DATA:  Seizures.  EXAM: PORTABLE CHEST - 1 VIEW  COMPARISON:  DG CHEST 2V dated 04/20/2013; DG ABDOMEN ACUTE 2V W/ 1V CHEST dated 07/05/2012; DG CHEST 2V dated 04/03/2012; DG CHEST 1V PORT dated 04/14/2008  FINDINGS: Lung volumes are lower than on prior. Apical lordotic projection. Monitoring leads project over the chest. The cardiopericardial silhouette appears within normal limits. No airspace disease. Mild left basilar atelectasis. Mediastinal contours are within normal limits allowing for volumes of inspiration.  IMPRESSION: No active disease.   Electronically Signed   By: Dereck Ligas M.D.   On: 07/25/2013 21:19    EKG Interpretation   None        MDM   64 yo M with history of metastatic renal cell carcinoma, s/p removal of liver metastasis, bipolar disorder, chronic kidney injury, who presents for evaluation of new onset seizures. On arrival he is post-ictal, maintaining his airway. He is HD stable. Noted to have left sided weakness, concern for ICH vs Todd's paralysis. IV access obtained. He was taken urgently to CT scan which revealed no acute intracranial abnormality. CBG normal. Following head CT patient's mental status returned to normal. His left sided weakness resolved as well. While in the ED he had one recurrent seizure, he was post-ictal for a short period of time. Given 1 mg of Ativan. I spoke to Neurology who recommended loading with Keppra. Labs reveal Cr of 2.5 [close to baseline], K 5.5, other lytes normal, Hgb 9.6, WBC 7.6. Lactic acid 6.7 as expected from seizure activity. Depakote level subtherapeutic. Lithium level undetectable. Low suspicion for infection as he has no significant abdominal pain, normal WBC, afebrile. CXR without acute abnormality. Unknown cause of patient's seizure. He was admitted to Hospitalist service for further management. I spoke to the patient about his diagnosis and need for admission, they were in agreement with plan.   Reviewed imaging, labs, ECG and previous medical records, utilized in MDM  Discussed case with Dr. Aline Brochure  Clinical Impression 1. New onset seizure 2. Lactic acidosis 3. Hyperkalemia 4. Chronic kidney disease    Louretta Shorten, MD 07/26/13 [phone]

## 2013-07-25 NOTE — Consult Note (Addendum)
Neurology Consultation Reason for Consult: SEizure Referring Physician: Aline Brochure, F  CC: Seizure  History is obtained from:Patient, wife  HPI: [name] is a 63 y.o. male with a history of RCC s/p recent liver surgery for tumor excision. He was told that it was cancer excised with good margins, and that it was "25%" of his liver removed.   Tonight, he had multiple seizures with left head deviation and left post-ictal paresis. His most recent seizure was here in the ED and he was given ativan 1mg .   His wife describes that his most recent seizure consisted of head deviation and shaking.   ROS: A 14 point ROS was performed and is negative except as noted in the HPI.  Past Medical History  Diagnosis Date  . Renal cell carcinoma     chemo and xrt--lost rt kidney  . Childhood asthma   . Sinusitis   . Cellulitis     after left foot surgery  . Osteoarthritis   . OSA on CPAP     NPSG 05-08-07 AHI 63.1/hr,desat t0 80%/loud snoring  . Bipolar 1 disorder   . Hyperlipemia   . Mental disorder   . Depression   . GERD (gastroesophageal reflux disease)   . Hypertension   . Allergy   . Anxiety   . Cataract   . Neuromuscular disorder   . Osteoporosis   . Thyroid disease     Family History: No hx sz  Social History: Tob: denies  Exam: Current vital signs: BP 114/82  Pulse 81  Temp(Src) 97.8 F (36.6 C)  Resp 15  SpO2 100% Vital signs in last 24 hours: Temp:  [97.8 F (36.6 C)] 97.8 F (36.6 C) (02/08 2140) Pulse Rate:  [81] 81 (02/08 2130) Resp:  [14-15] 15 (02/08 2130) BP: (102-114)/(67-82) 114/82 mmHg (02/08 2130) SpO2:  [100 %] 100 % (02/08 2130)  General: in bed, NAD CV: RRR Mental Status: Patient is awake, alert, oriented to person, place, month, year, and situation. Immediate and remote memory are intact. Patient is able to give a clear and coherent history. No signs of aphasia  Cranial Nerves: II: Visual Fields are full. Pupils are equal, round, and  reactive to light.  Discs are difficult to visualize. III,IV, VI: EOMI without ptosis or diploplia.  V: Facial sensation is symmetric to temperature VII: Facial movement is notable for left facial droop.  VIII: hearing is intact to voice X: Uvula elevates symmetrically XI: Shoulder shrug is symmetric. XII: tongue is midline without atrophy or fasciculations.  Motor: Tone is normal. Bulk is normal. 5/5 strength was present in bilateral upper extremities. 4/5 strength in bilateral lower extremities(baseline) Sensory: Sensation is symmetric to light touch and temperature in the arms and legs. Deep Tendon Reflexes: 2+ and symmetric in the biceps and patellae.  Cerebellar: FNF  With mild tremor bilaterally Gait: Not assessed due to multiple medical monitors in ED setting and weakness    I have reviewed labs in epic and the results pertinent to this consultation are: cmp - elevated creatinine.   I have reviewed the images obtained: CT head - no clear acute findings  Impression: 63 yo M with new focal seizures in the setting of known metastatic renal cell carcinoma.It could be multifactorial including wellbutrin, sleep deprivation, recent medication changes, etc but would need to exclude CNS metastasis. He is currently being loaded with keppra, but given that he is on depakote at baseline, would prefer to increase this, though I am concerned that  his tremor might be made worse by depakote and if thsi worsens with an increase in dose, could consider other AED/mood stabilizers such as lamictal.   Recommendations: 1) Would favor increasing depakote to 750mg  BID if level is subtheraputic rather than starting second AED.  2) Check Mg level as had issues with this during recent hospitalization 3) Stop wellbutrin as it lowers seizure threshold considerably. 4)  Would recommend psychiatry consultation for assistance with his medications given there will be multiple changes.  5) MRI brain w/wo  contrast 6) EEG  Roland Rack, MD [facility] [phone]  If 7pm- 7am, please page neurology on call at [phone].

## 2013-07-25 NOTE — ED Notes (Signed)
Per Dr. Leonel Ramsay, pt to be loaded with keppra.

## 2013-07-25 NOTE — ED Notes (Signed)
1 mg of Ativan given 

## 2013-07-25 NOTE — ED Notes (Signed)
Pt BIB EMS with c/o new onset seizures.  Pt has had a total of 4 grand mal seizures PTA. Pt given 5 of Versed.  Pt on NRB on arrival.  Airway currently protected.  Pt initially had left sided gaze after first seizure.  Pt is from [facility]. Recent abdominal surgery.

## 2013-07-25 NOTE — ED Notes (Signed)
Neuro at bedside.

## 2013-07-25 NOTE — ED Notes (Signed)
Patient refused to have a rectal temp taken.

## 2013-07-26 ENCOUNTER — Telehealth: Payer: Self-pay

## 2013-07-26 ENCOUNTER — Inpatient Hospital Stay (HOSPITAL_COMMUNITY): Payer: 59

## 2013-07-26 DIAGNOSIS — K5900 Constipation, unspecified: Secondary | ICD-10-CM

## 2013-07-26 DIAGNOSIS — D638 Anemia in other chronic diseases classified elsewhere: Secondary | ICD-10-CM | POA: Diagnosis present

## 2013-07-26 DIAGNOSIS — R569 Unspecified convulsions: Principal | ICD-10-CM

## 2013-07-26 DIAGNOSIS — R600 Localized edema: Secondary | ICD-10-CM | POA: Diagnosis present

## 2013-07-26 DIAGNOSIS — R5381 Other malaise: Secondary | ICD-10-CM | POA: Diagnosis present

## 2013-07-26 DIAGNOSIS — R4182 Altered mental status, unspecified: Secondary | ICD-10-CM

## 2013-07-26 DIAGNOSIS — N183 Chronic kidney disease, stage 3 unspecified: Secondary | ICD-10-CM

## 2013-07-26 DIAGNOSIS — F3160 Bipolar disorder, current episode mixed, unspecified: Secondary | ICD-10-CM

## 2013-07-26 LAB — CBC
HCT: 26.9 % — ABNORMAL LOW (ref 39.0–52.0)
HEMOGLOBIN: 8.8 g/dL — AB (ref 13.0–17.0)
MCH: 31.7 pg (ref 26.0–34.0)
MCHC: 32.7 g/dL (ref 30.0–36.0)
MCV: 96.8 fL (ref 78.0–100.0)
Platelets: 360 10*3/uL (ref 150–400)
RBC: 2.78 MIL/uL — ABNORMAL LOW (ref 4.22–5.81)
RDW: 15.1 % (ref 11.5–15.5)
WBC: 5.1 10*3/uL (ref 4.0–10.5)

## 2013-07-26 LAB — COMPREHENSIVE METABOLIC PANEL
ALK PHOS: 91 U/L (ref 39–117)
ALT: 31 U/L (ref 0–53)
AST: 27 U/L (ref 0–37)
Albumin: 2.5 g/dL — ABNORMAL LOW (ref 3.5–5.2)
BUN: 29 mg/dL — ABNORMAL HIGH (ref 6–23)
CO2: 25 mEq/L (ref 19–32)
Calcium: 8.8 mg/dL (ref 8.4–10.5)
Chloride: 104 mEq/L (ref 96–112)
Creatinine, Ser: 2.28 mg/dL — ABNORMAL HIGH (ref 0.50–1.35)
GFR calc non Af Amer: 29 mL/min — ABNORMAL LOW (ref >90)
GFR, EST AFRICAN AMERICAN: 34 mL/min — AB (ref >90)
Glucose, Bld: 118 mg/dL — ABNORMAL HIGH (ref 70–99)
POTASSIUM: 4.9 meq/L (ref 3.7–5.3)
SODIUM: 141 meq/L (ref 137–147)
TOTAL PROTEIN: 6.2 g/dL (ref 6.0–8.3)
Total Bilirubin: <0.2 mg/dL — ABNORMAL LOW (ref 0.3–1.2)

## 2013-07-26 LAB — URINALYSIS, ROUTINE W REFLEX MICROSCOPIC
BILIRUBIN URINE: NEGATIVE
GLUCOSE, UA: NEGATIVE mg/dL
HGB URINE DIPSTICK: NEGATIVE
KETONES UR: NEGATIVE mg/dL
Nitrite: NEGATIVE
PH: 7.5 (ref 5.0–8.0)
Protein, ur: NEGATIVE mg/dL
Specific Gravity, Urine: 1.014 (ref 1.005–1.030)
Urobilinogen, UA: 0.2 mg/dL (ref 0.0–1.0)

## 2013-07-26 LAB — URINE MICROSCOPIC-ADD ON

## 2013-07-26 LAB — MRSA PCR SCREENING: MRSA by PCR: NEGATIVE

## 2013-07-26 LAB — LACTIC ACID, PLASMA: Lactic Acid, Venous: 1.4 mmol/L (ref 0.5–2.2)

## 2013-07-26 MED ORDER — ONDANSETRON HCL 4 MG/2ML IJ SOLN: 4.0000 mg | Freq: Four times a day (QID) | INTRAMUSCULAR | Status: DC | PRN

## 2013-07-26 MED ORDER — ENOXAPARIN SODIUM 40 MG/0.4ML ~~LOC~~ SOLN
40.0000 mg | SUBCUTANEOUS | Status: DC
  Administered 2013-07-26 – 2013-07-27 (×2): 40 mg via SUBCUTANEOUS
  Filled 2013-07-26 (×2): qty 0.4

## 2013-07-26 MED ORDER — BUPROPION HCL ER (SR) 100 MG PO TB12
200.0000 mg | ORAL_TABLET | Freq: Two times a day (BID) | ORAL | Status: DC
  Filled 2013-07-26 (×2): qty 2

## 2013-07-26 MED ORDER — ACETAMINOPHEN 325 MG PO TABS
650.0000 mg | ORAL_TABLET | Freq: Four times a day (QID) | ORAL | Status: DC | PRN
  Administered 2013-07-30: 650 mg via ORAL
  Filled 2013-07-26: qty 2

## 2013-07-26 MED ORDER — SODIUM CHLORIDE 0.9 % IV SOLN: 250.0000 mL | INTRAVENOUS | Status: DC | PRN

## 2013-07-26 MED ORDER — SODIUM CHLORIDE 0.9 % IJ SOLN
3.0000 mL | Freq: Two times a day (BID) | INTRAMUSCULAR | Status: DC
  Administered 2013-07-26: 3 mL via INTRAVENOUS
  Administered 2013-07-26: 6 mL via INTRAVENOUS
  Administered 2013-07-27 – 2013-07-31 (×8): 3 mL via INTRAVENOUS
  Administered 2013-07-31: 11:00:00 via INTRAVENOUS
  Administered 2013-08-01 – 2013-08-03 (×5): 3 mL via INTRAVENOUS

## 2013-07-26 MED ORDER — BENEPROTEIN PO POWD
1.0000 | Freq: Two times a day (BID) | ORAL | Status: DC
  Administered 2013-07-26 – 2013-08-03 (×13): 6 g via ORAL
  Filled 2013-07-26 (×2): qty 227

## 2013-07-26 MED ORDER — SODIUM CHLORIDE 0.9 % IJ SOLN
3.0000 mL | INTRAMUSCULAR | Status: DC | PRN
  Administered 2013-08-01: 3 mL via INTRAVENOUS

## 2013-07-26 MED ORDER — AMLODIPINE BESYLATE 10 MG PO TABS
10.0000 mg | ORAL_TABLET | Freq: Every day | ORAL | Status: DC
Start: 2013-07-26 — End: 2013-08-03
  Administered 2013-07-26 – 2013-08-03 (×8): 10 mg via ORAL
  Filled 2013-07-26 (×9): qty 1

## 2013-07-26 MED ORDER — PANTOPRAZOLE SODIUM 40 MG PO TBEC
40.0000 mg | DELAYED_RELEASE_TABLET | Freq: Every day | ORAL | Status: DC
  Administered 2013-07-26 – 2013-08-03 (×9): 40 mg via ORAL
  Filled 2013-07-26 (×8): qty 1

## 2013-07-26 MED ORDER — SENNA-DOCUSATE SODIUM 8.6-50 MG PO TABS
2.0000 | ORAL_TABLET | Freq: Two times a day (BID) | ORAL | Status: DC
  Administered 2013-07-26 – 2013-08-03 (×16): 2 via ORAL
  Filled 2013-07-26 (×21): qty 2

## 2013-07-26 MED ORDER — PROPRANOLOL HCL 20 MG PO TABS
20.0000 mg | ORAL_TABLET | Freq: Three times a day (TID) | ORAL | Status: DC
  Administered 2013-07-26 – 2013-08-03 (×20): 20 mg via ORAL
  Filled 2013-07-26 (×27): qty 1

## 2013-07-26 MED ORDER — TAMSULOSIN HCL 0.4 MG PO CAPS
0.4000 mg | ORAL_CAPSULE | Freq: Every day | ORAL | Status: DC
  Administered 2013-07-26 – 2013-08-03 (×9): 0.4 mg via ORAL
  Filled 2013-07-26 (×10): qty 1

## 2013-07-26 MED ORDER — MAGNESIUM CITRATE PO SOLN
1.0000 | Freq: Once | ORAL | Status: AC | PRN
  Administered 2013-07-26: 1 via ORAL
  Filled 2013-07-26: qty 296

## 2013-07-26 MED ORDER — BACLOFEN 10 MG PO TABS
10.0000 mg | ORAL_TABLET | Freq: Three times a day (TID) | ORAL | Status: DC
  Administered 2013-07-26 – 2013-08-03 (×25): 10 mg via ORAL
  Filled 2013-07-26 (×27): qty 1

## 2013-07-26 MED ORDER — ACETAMINOPHEN 650 MG RE SUPP: 650.0000 mg | Freq: Four times a day (QID) | RECTAL | Status: DC | PRN

## 2013-07-26 MED ORDER — ONDANSETRON HCL 4 MG PO TABS
4.0000 mg | ORAL_TABLET | Freq: Four times a day (QID) | ORAL | Status: DC | PRN
  Administered 2013-07-30: 4 mg via ORAL
  Filled 2013-07-26: qty 1

## 2013-07-26 MED ORDER — LEVOTHYROXINE SODIUM 50 MCG PO TABS
50.0000 ug | ORAL_TABLET | Freq: Every day | ORAL | Status: DC
  Administered 2013-07-26 – 2013-08-03 (×9): 50 ug via ORAL
  Filled 2013-07-26 (×11): qty 1

## 2013-07-26 NOTE — Telephone Encounter (Signed)
THIS MESSAGE IS FOR DR. Kerman WANTED TO BE SURE THAT DR. DAUB RECEIVED HIS TEXT. HE WANTED TO UPDATE HIM ON HIS STATUS. AFTER DR. DAUB VISITED HIM ON Sunday AT [facility], THEY THINK HE HAD SEVERAL SEIZURES. HE WAS TRANSPORTED TO [facility]. HE IS SCHEDULED TO HAVE A BRAIN MRI TODAY, HOWEVER, HE DID GET A GOOD NIGHT'S SLEEP.  IF DR. DAUB HAS ANY QUESTIONS HE CAN CALL MR. Guidone BACK. BEST PHONE [phone] (CELL)  WIFE'S CELL IS [phone] ([place])   [place]

## 2013-07-26 NOTE — Clinical Social Work Note (Signed)
Clinical Social Work Department BRIEF PSYCHOSOCIAL ASSESSMENT 07/26/2013  Patient:  [name], [name]     Account Number:  [id number]     Admit date:  07/25/2013  Clinical Social Worker:  Myles Lipps  Date/Time:  07/26/2013 10:45 AM  Referred by:  RN  Date Referred:  07/26/2013 Referred for  SNF Placement   Other Referral:   Interview type:  Patient Other interview type:   Patient wife at bedside    PSYCHOSOCIAL DATA Living Status:  [place] Admitted from facility:  [place] Level of care:  [place] Primary support name:  [name],[name]  [phone] Primary support relationship to patient:  SPOUSE Degree of support available:   Strong    CURRENT CONCERNS Current Concerns  Post-Acute Placement   Other Concerns:   Patient has denied a bed hold at facility - will return if bed available    SOCIAL WORK ASSESSMENT / PLAN Clinical Social Worker met with patient and patient wife at bedside to offer support and discuss patient needs at discharge.  Patient states that he has been at [facility] for several weeks and was placed at [facility] on Friday 02/06. Due to seizure like activity, patient was brought in on Sunday, 02/08.  Patient states that he will not be holding the bed at [facility] but would entertain the idea of return if bed available once medically ready.    Patient did not express current interest in initiating search to find a new facility.  Patient seems to understand the SNF process.  Patient wife states that [name] may be a good back up option if [name] is not available. CSW to complete FL2 and follow up with patient once medically stable.  CSW remains available for support and to facilitate patient discharge needs once medically ready.   Assessment/plan status:  Psychosocial Support/Ongoing Assessment of Needs Other assessment/ plan:   Information/referral to community resources:   [facility] representative offered patient  facility list, however he states that he has just been through this same process at [facility] and does not need a list at this time. Patient questioned ambulance transport for follow up at [place] recommended contacting insurance company directly regarding payment options.    PATIENT'S/FAMILY'S RESPONSE TO PLAN OF CARE: Patient alert and oriented x3 laying in bed with his wife at the bedside.  Patient with a flat affect but very in tune with his needs and wishes for placement.  Patient wife agrees with patient decisions and allows him to speak for himself.  Patient expresses concerns regarding [facility] follow up and potential fluid restrictions while hospitalized. Patient seems to have a good understanding of social work role and verbalized his appreciation for support and concern.

## 2013-07-26 NOTE — Progress Notes (Signed)
[name] ICU Progress Note  [name] Gallop FXT:[id number] DOB: 10/27/1950 DOA: 07/25/2013 PCP: Isaac Reichmann, MD  Brief narrative: 89 male patient with multiple medical problems including bipolar disorder, renal cell carcinoma status post chemotherapy and nephrectomy, hypertension, and recent tumor excision for mass on the liver. The latter procedure had been performed at [name] and patient was significantly deconditioned after this procedure so was discharged to [name] skilled nursing facility on 07/23/2013.  Patient apparently had 3 seizures at his SNF prior to arrival to the emergency department. The patient was apparently aware of at least one episode in which he remembers his arms being raised above his head that his head turning to the left with the episode apparently lasting 2 minutes. He does not recall any further episodes. EMS reported 2 additional episodes prior to their arrival and then EMS witnessed an additional seizure at which point the patient turned his head to the left and had generalized tonic-clonic activity of the upper extremities. He was subsequently given 5 mg of Versed by EMS which aborted the activiyt. Upon arrival to the emergency department he was postictal with left-sided weakness.  Patient has an extensive psychiatric history. Because of his acute renal failure on chronic kidney disease the lithium he had been on  for many years was discontinued prior to discharge from [name]. He had also recently been started on primidone prior to discharge from [name]. Patient reported to the admitting physician that "I was concerned of getting epilepsy as one of the side effects from primidone is epilepsy".  Patient reported significant anasarca including lower extremity edema had been ongoing since his surgery at [name]. This was attributed to his renal function according to the patient.  Assessment/Plan:  New onset seizure -Neurology following - recommend  discontinuing Wellbutrin - increased Depakote dose  -MRI pending -EEG today without current seizure activity and otherwise is normal  OBSTRUCTIVE SLEEP APNEA -Resume hour of sleep CPAP  Bipolar 1 disorder, mixed -Lithium discontinued prior to discharge from [name] -Review of outpatient records reveals patient has had extensive manic episodes when off of this medication therefore we'll ask Psychiatry to weigh in on medication options especially since we are also having to now discontinue Wellbutrin   CKD (chronic kidney disease), stage III -Stable compared to August 2014 data  Bilateral lower extremity edema -Could be related to nephropathy but given renal function is stable we'll check lower extremity duplex to rule out DVT -If positive for DVT especially in known malignancy may need to check CT of the chest to rule out PE especially given the new seizure activity  Grade 1 diastolic dysfunction -Seen on echo 2014 and currently compensated  MALIGNANT NEOPLASM OF KIDNEY EXCEPT PELVIS -MRI of brain pending to rule out metastatic neoplasm  Hypothyroidism -Synthroid  Physical deconditioning -Patient prefers not to return to [name] and requests bed search for new skilled nursing facility prior to discharge  Anemia, chronic disease -Hemoglobin stable  DVT prophylaxis: Lovenox Code Status: Full Family Communication: No family at bedside Disposition Plan/Expected LOS: Transfer to neuro bed   Consultants: Neurology  Procedures: Bilateral lower extremity venous duplex - pending  Antibiotics: None  HPI/Subjective: Patient alert. Complaining of constipation. Denies recent seizure activity since admission. No definitive preseizure aura noted. Complaining of thirst and would like to know when can eat.  Objective: Blood pressure 102/58, pulse 55, temperature 98.1 F (36.7 C), temperature source Oral, resp. rate 19, height 6\' 1"  (1.854 m), weight  233 lb 11 oz (106 kg), SpO2  99.00%.  Intake/Output Summary (Last 24 hours) at 07/26/13 1311 Last data filed at 07/26/13 1145  Gross per 24 hour  Intake    320 ml  Output    850 ml  Net   -530 ml    Exam: F/U exam completed  Scheduled Meds:  Scheduled Meds: . amLODipine  10 mg Oral Daily  . baclofen  10 mg Oral TID  . divalproex  750 mg Oral Q12H  . enoxaparin (LOVENOX) injection  40 mg Subcutaneous Q24H  . levothyroxine  50 mcg Oral QAC breakfast  . pantoprazole  40 mg Oral Daily  . propranolol  20 mg Oral TID  . sennosides-docusate sodium  2 tablet Oral BID  . sodium chloride  3 mL Intravenous Q12H  . tamsulosin  0.4 mg Oral QPC breakfast    Data Reviewed: Basic Metabolic Panel:  Recent Labs Lab 07/25/13 2122 07/25/13 2135 07/25/13 2209 07/26/13 0335  NA 142  --  140 141  K 5.5*  --  4.8 4.9  CL 102  --  102 104  CO2 22  --   --  25  GLUCOSE 140*  --  130* 118*  BUN 30*  --  28* 29*  CREATININE 2.54*  --  2.50* 2.28*  CALCIUM 8.9  --   --  8.8  MG  --  3.0*  --   --    Liver Function Tests:  Recent Labs Lab 07/25/13 2122 07/26/13 0335  AST 30 27  ALT 32 31  ALKPHOS 99 91  BILITOT <0.2* <0.2*  PROT 6.6 6.2  ALBUMIN 2.7* 2.5*    Recent Labs Lab 07/25/13 2123  AMMONIA 26   CBC:  Recent Labs Lab 07/25/13 2122 07/25/13 2209 07/26/13 0335  WBC 7.6  --  5.1  NEUTROABS 5.9  --   --   HGB 9.6* 11.9* 8.8*  HCT 29.8* 35.0* 26.9*  MCV 97.4  --  96.8  PLT 378  --  360   CBG:  Recent Labs Lab 07/25/13 2151  GLUCAP 130*    Recent Results (from the past 240 hour(s))  MRSA PCR SCREENING     Status: None   Collection Time    07/26/13  1:41 AM      Result Value Range Status   MRSA by PCR NEGATIVE  NEGATIVE Final   Comment:            The GeneXpert MRSA Assay (FDA     approved for NASAL specimens     only), is one component of a     comprehensive MRSA colonization     surveillance program. It is not     intended to diagnose MRSA     infection nor to guide or      monitor treatment for     MRSA infections.     Studies:  Recent x-ray studies have been reviewed in detail by the Attending Physician  Time spent : 25+ mins     Erin Hearing, ANP [facility] Office  [phone] Pager [phone]  **If unable to reach the above provider after paging please contact the [facility] @ [phone]  On-Call/Text Page:      [email or web address]      password [email or web address]  If 7PM-7AM, please contact night-coverage [email or web address] Password [email or web address] 07/26/2013, 1:11 PM   LOS: 1 day   I have personally examined this patient and reviewed the entire database. I have reviewed the above note,  made any necessary editorial changes, and agree with its content.  Cherene Altes, MD [facility]

## 2013-07-26 NOTE — ED Provider Notes (Signed)
Medical screening examination/treatment/procedure(s) were conducted as a shared visit with resident physician and myself.  I personally evaluated the patient during the encounter.    Date: 07/26/2013  Rate: 97  Rhythm: normal sinus rhythm  QRS Axis: normal  Intervals: normal  ST/T Wave abnormalities: normal  Conduction Disutrbances:none  Narrative Interpretation: No ST or T wave changes consistent with ischemia.    Old EKG Reviewed: unchanged  I interviewed and examined the patient. Lungs are CTAB. Cardiac exam wnl. Abdomen soft, well healing large surgical wound and drain in RLQ w/out evidence of surrounding infection.  Left sided weakness appears to be resolving, CT neg. Neuro consulted. Will admit.   Blanchard Kelch, MD 07/26/13 1344

## 2013-07-26 NOTE — Consult Note (Signed)
[facility] Face-to-Face Psychiatry Consult   Reason for Consult:  Bipolar disorder, medication management Referring Physician:  Dr Angelita Ingles [name] is an 63 y.o. male. Total Time spent with patient: 30 minutes  Assessment: AXIS I:  Bipolar, mixed AXIS II:  Deferred AXIS III:   Past Medical History  Diagnosis Date  . Renal cell carcinoma     chemo and xrt--lost rt kidney  . Childhood asthma   . Sinusitis   . Cellulitis     after left foot surgery  . Osteoarthritis   . OSA on CPAP     NPSG 05-08-07 AHI 63.1/hr,desat t0 80%/loud snoring  . Bipolar 1 disorder   . Hyperlipemia   . Mental disorder   . Depression   . GERD (gastroesophageal reflux disease)   . Hypertension   . Allergy   . Anxiety   . Cataract   . Neuromuscular disorder   . Osteoporosis   . Thyroid disease    AXIS IV:  other psychosocial or environmental problems and problems related to social environment AXIS V:  61-70 mild symptoms  Plan:  Patient does not meet criteria for psychiatric inpatient admission. Supportive therapy provided about ongoing stressors. Discussed crisis plan, support from social network, calling 911, coming to the Emergency Department, and calling Suicide Hotline.  Subjective:   [name] is a 63 y.o. male patient admitted with seizure.  HPI:  Patient seen and chart reviewed.  Patient was admitted to the medical floor after having a seizure.  Consult was called because patient has history of bipolar disorder and needed medication adjustment.  Patient has very good response with lithium, however, it has been discontinued because his kidney function is deteriorated.  He is currently on depakote and dose has been recently increased.  Patient in the past has tried abilify, zyprexa, paxil, neurontin with very limited response.  Patient insists lithium had helped him.  Recently he had a seizure which he believed was due to primodorne.  Patient has history of multiple psychiatric hospitalizations  due to decompensation.  Patient has multiple health issues including high BUN and creatinine.  Patient has been explained that lithium is not appropriate for his bipolar disorder and he should try other medications, however, patient does not want any new medication at this time until he discusses with his wife and current outpatient psychiatrist.  Patient admitted irritability, insomnia, frustration, and some mood swings but denies any hallucinations, paranoia, active suicidal and homicidal thoughts.  He is concerned about his physical health issues.  He has not reported any side effects with depakote. Patient would like to continue depakote and we will defer adding any new medication at this time.   HPI Elements:   Location:  irritibity, frustartion, poor sleep. Quality:  fair. Severity:  mild. Timing:  on going.  Past Psychiatric History: Past Medical History  Diagnosis Date  . Renal cell carcinoma     chemo and xrt--lost rt kidney  . Childhood asthma   . Sinusitis   . Cellulitis     after left foot surgery  . Osteoarthritis   . OSA on CPAP     NPSG 05-08-07 AHI 63.1/hr,desat t0 80%/loud snoring  . Bipolar 1 disorder   . Hyperlipemia   . Mental disorder   . Depression   . GERD (gastroesophageal reflux disease)   . Hypertension   . Allergy   . Anxiety   . Cataract   . Neuromuscular disorder   . Osteoporosis   .  Thyroid disease     reports that he has never smoked. He has never used smokeless tobacco. He reports that he drinks alcohol. He reports that he does not use illicit drugs. Family History  Problem Relation Age of Onset  . Hypertension    . Sleep apnea    . Emphysema    . Prostate cancer    . Arthritis Father   . Colon cancer Father      Living Arrangements: Spouse/significant other   Abuse/Neglect [facility]) Physical Abuse: Denies Verbal Abuse: Denies Sexual Abuse: Denies Allergies:  No Known Allergies  ACT Assessment Complete:  No:   Past Psychiatric  History: Diagnosis:  bipolar  Hospitalizations:  Yes at [facility], [facility]  Outpatient Care:  Dr Jeb Levering  Substance Abuse Care:  denies  Self-Mutilation:  no  Suicidal Attempt; no  Homicidal Behaviors:  no   Violent Behaviors:  no   Place of Residence:  Lives with wife Marital Status:  yes Employed/Unemployed:  Un employed Family Supports:  yes Objective: Blood pressure 105/67, pulse 62, temperature 98.6 F (37 C), temperature source Oral, resp. rate 16, height 6' 1"  (1.854 m), weight 233 lb 11 oz (106 kg), SpO2 100.00%.Body mass index is 30.84 kg/(m^2). Results for orders placed during the hospital encounter of 07/25/13 (from the past 72 hour(s))  CBC WITH DIFFERENTIAL     Status: Abnormal   Collection Time    07/25/13  9:22 PM      Result Value Range   WBC 7.6  4.0 - 10.5 K/uL   RBC 3.06 (*) 4.22 - 5.81 MIL/uL   Hemoglobin 9.6 (*) 13.0 - 17.0 g/dL   HCT 29.8 (*) 39.0 - 52.0 %   MCV 97.4  78.0 - 100.0 fL   MCH 31.4  26.0 - 34.0 pg   MCHC 32.2  30.0 - 36.0 g/dL   RDW 15.2  11.5 - 15.5 %   Platelets 378  150 - 400 K/uL   Neutrophils Relative % 78 (*) 43 - 77 %   Neutro Abs 5.9  1.7 - 7.7 K/uL   Lymphocytes Relative 6 (*) 12 - 46 %   Lymphs Abs 0.4 (*) 0.7 - 4.0 K/uL   Monocytes Relative 7  3 - 12 %   Monocytes Absolute 0.5  0.1 - 1.0 K/uL   Eosinophils Relative 9 (*) 0 - 5 %   Eosinophils Absolute 0.6  0.0 - 0.7 K/uL   Basophils Relative 0  0 - 1 %   Basophils Absolute 0.0  0.0 - 0.1 K/uL  COMPREHENSIVE METABOLIC PANEL     Status: Abnormal   Collection Time    07/25/13  9:22 PM      Result Value Range   Sodium 142  137 - 147 mEq/L   Potassium 5.5 (*) 3.7 - 5.3 mEq/L   Chloride 102  96 - 112 mEq/L   CO2 22  19 - 32 mEq/L   Glucose, Bld 140 (*) 70 - 99 mg/dL   BUN 30 (*) 6 - 23 mg/dL   Creatinine, Ser 2.54 (*) 0.50 - 1.35 mg/dL   Calcium 8.9  8.4 - 10.5 mg/dL   Total Protein 6.6  6.0 - 8.3 g/dL   Albumin 2.7 (*) 3.5 - 5.2 g/dL   AST 30  0 - 37 U/L   ALT 32  0 - 53 U/L    Alkaline Phosphatase 99  39 - 117 U/L   Total Bilirubin <0.2 (*) 0.3 - 1.2 mg/dL   GFR  calc non Af Amer 26 (*) >90 mL/min   GFR calc Af Amer 30 (*) >90 mL/min   Comment: (NOTE)     The eGFR has been calculated using the CKD EPI equation.     This calculation has not been validated in all clinical situations.     eGFR's persistently <90 mL/min signify possible Chronic Kidney     Disease.  LACTIC ACID, PLASMA     Status: Abnormal   Collection Time    07/25/13  9:22 PM      Result Value Range   Lactic Acid, Venous 6.7 (*) 0.5 - 2.2 mmol/L  AMMONIA     Status: None   Collection Time    07/25/13  9:23 PM      Result Value Range   Ammonia 26  11 - 60 umol/L  VALPROIC ACID LEVEL     Status: Abnormal   Collection Time    07/25/13  9:35 PM      Result Value Range   Valproic Acid Lvl 22.4 (*) 50.0 - 100.0 ug/mL  MAGNESIUM     Status: Abnormal   Collection Time    07/25/13  9:35 PM      Result Value Range   Magnesium 3.0 (*) 1.5 - 2.5 mg/dL  LITHIUM LEVEL     Status: Abnormal   Collection Time    07/25/13  9:42 PM      Result Value Range   Lithium Lvl <0.25 (*) 0.80 - 1.40 mEq/L  GLUCOSE, CAPILLARY     Status: Abnormal   Collection Time    07/25/13  9:51 PM      Result Value Range   Glucose-Capillary 130 (*) 70 - 99 mg/dL  POCT I-STAT, CHEM 8     Status: Abnormal   Collection Time    07/25/13 10:09 PM      Result Value Range   Sodium 140  137 - 147 mEq/L   Potassium 4.8  3.7 - 5.3 mEq/L   Chloride 102  96 - 112 mEq/L   BUN 28 (*) 6 - 23 mg/dL   Creatinine, Ser 2.50 (*) 0.50 - 1.35 mg/dL   Glucose, Bld 130 (*) 70 - 99 mg/dL   Calcium, Ion 1.19  1.13 - 1.30 mmol/L   TCO2 23  0 - 100 mmol/L   Hemoglobin 11.9 (*) 13.0 - 17.0 g/dL   HCT 35.0 (*) 39.0 - 52.0 %  MRSA PCR SCREENING     Status: None   Collection Time    07/26/13  1:41 AM      Result Value Range   MRSA by PCR NEGATIVE  NEGATIVE   Comment:            The GeneXpert MRSA Assay (FDA     approved for NASAL  specimens     only), is one component of a     comprehensive MRSA colonization     surveillance program. It is not     intended to diagnose MRSA     infection nor to guide or     monitor treatment for     MRSA infections.  CBC     Status: Abnormal   Collection Time    07/26/13  3:35 AM      Result Value Range   WBC 5.1  4.0 - 10.5 K/uL   Comment: WHITE COUNT CONFIRMED ON SMEAR   RBC 2.78 (*) 4.22 - 5.81 MIL/uL   Hemoglobin 8.8 (*) 13.0 - 17.0 g/dL  Comment: DELTA CHECK NOTED     REPEATED TO VERIFY   HCT 26.9 (*) 39.0 - 52.0 %   MCV 96.8  78.0 - 100.0 fL   MCH 31.7  26.0 - 34.0 pg   MCHC 32.7  30.0 - 36.0 g/dL   RDW 15.1  11.5 - 15.5 %   Platelets 360  150 - 400 K/uL  COMPREHENSIVE METABOLIC PANEL     Status: Abnormal   Collection Time    07/26/13  3:35 AM      Result Value Range   Sodium 141  137 - 147 mEq/L   Potassium 4.9  3.7 - 5.3 mEq/L   Chloride 104  96 - 112 mEq/L   CO2 25  19 - 32 mEq/L   Glucose, Bld 118 (*) 70 - 99 mg/dL   BUN 29 (*) 6 - 23 mg/dL   Creatinine, Ser 2.28 (*) 0.50 - 1.35 mg/dL   Calcium 8.8  8.4 - 10.5 mg/dL   Total Protein 6.2  6.0 - 8.3 g/dL   Albumin 2.5 (*) 3.5 - 5.2 g/dL   AST 27  0 - 37 U/L   ALT 31  0 - 53 U/L   Alkaline Phosphatase 91  39 - 117 U/L   Total Bilirubin <0.2 (*) 0.3 - 1.2 mg/dL   GFR calc non Af Amer 29 (*) >90 mL/min   GFR calc Af Amer 34 (*) >90 mL/min   Comment: (NOTE)     The eGFR has been calculated using the CKD EPI equation.     This calculation has not been validated in all clinical situations.     eGFR's persistently <90 mL/min signify possible Chronic Kidney     Disease.  LACTIC ACID, PLASMA     Status: None   Collection Time    07/26/13  3:35 AM      Result Value Range   Lactic Acid, Venous 1.4  0.5 - 2.2 mmol/L  URINALYSIS, ROUTINE W REFLEX MICROSCOPIC     Status: Abnormal   Collection Time    07/26/13 11:41 AM      Result Value Range   Color, Urine YELLOW  YELLOW   APPearance CLEAR  CLEAR   Specific  Gravity, Urine 1.014  1.005 - 1.030   pH 7.5  5.0 - 8.0   Glucose, UA NEGATIVE  NEGATIVE mg/dL   Hgb urine dipstick NEGATIVE  NEGATIVE   Bilirubin Urine NEGATIVE  NEGATIVE   Ketones, ur NEGATIVE  NEGATIVE mg/dL   Protein, ur NEGATIVE  NEGATIVE mg/dL   Urobilinogen, UA 0.2  0.0 - 1.0 mg/dL   Nitrite NEGATIVE  NEGATIVE   Leukocytes, UA SMALL (*) NEGATIVE  URINE MICROSCOPIC-ADD ON     Status: None   Collection Time    07/26/13 11:41 AM      Result Value Range   Squamous Epithelial / LPF RARE  RARE   WBC, UA 3-6  <3 WBC/hpf   Bacteria, UA RARE  RARE   Labs are reviewed.  Current Facility-Administered Medications  Medication Dose Route Frequency Provider Last Rate Last Dose  . 0.9 %  sodium chloride infusion  250 mL Intravenous PRN Oswald Hillock, MD      . acetaminophen (TYLENOL) tablet 650 mg  650 mg Oral Q6H PRN Oswald Hillock, MD       Or  . acetaminophen (TYLENOL) suppository 650 mg  650 mg Rectal Q6H PRN Oswald Hillock, MD      . amLODipine ([place])  tablet 10 mg  10 mg Oral Daily Oswald Hillock, MD   10 mg at 07/26/13 0953  . baclofen (LIORESAL) tablet 10 mg  10 mg Oral TID Oswald Hillock, MD   10 mg at 07/26/13 1533  . divalproex (DEPAKOTE ER) 24 hr tablet 750 mg  750 mg Oral Q12H Roland Rack, MD   750 mg at 07/26/13 0953  . enoxaparin (LOVENOX) injection 40 mg  40 mg Subcutaneous Q24H Oswald Hillock, MD   40 mg at 07/26/13 0953  . levothyroxine (SYNTHROID, LEVOTHROID) tablet 50 mcg  50 mcg Oral QAC breakfast Oswald Hillock, MD   50 mcg at 07/26/13 0806  . magnesium citrate solution 1 Bottle  1 Bottle Oral Once PRN Cherene Altes, MD      . ondansetron [facility]) tablet 4 mg  4 mg Oral Q6H PRN Oswald Hillock, MD       Or  . ondansetron (ZOFRAN) injection 4 mg  4 mg Intravenous Q6H PRN Oswald Hillock, MD      . pantoprazole (PROTONIX) EC tablet 40 mg  40 mg Oral Daily Oswald Hillock, MD   40 mg at 07/26/13 0953  . propranolol (INDERAL) tablet 20 mg  20 mg Oral TID Samella Parr, NP   20  mg at 07/26/13 1621  . protein supplement (RESOURCE BENEPROTEIN) powder 6 g  1 scoop Oral BID WC Cherene Altes, MD   6 g at 07/26/13 1824  . sennosides-docusate sodium (SENOKOT-S) 8.6-50 MG tablet 2 tablet  2 tablet Oral BID Oswald Hillock, MD   2 tablet at 07/26/13 [phone]  . sodium chloride 0.9 % injection 3 mL  3 mL Intravenous Q12H Oswald Hillock, MD   6 mL at 07/26/13 1000  . sodium chloride 0.9 % injection 3 mL  3 mL Intravenous PRN Oswald Hillock, MD      . tamsulosin (FLOMAX) capsule 0.4 mg  0.4 mg Oral QPC breakfast Oswald Hillock, MD   0.4 mg at 07/26/13 0806    Psychiatric Specialty Exam:     Blood pressure 105/67, pulse 62, temperature 98.6 F (37 C), temperature source Oral, resp. rate 16, height 6' 1"  (1.854 m), weight 233 lb 11 oz (106 kg), SpO2 100.00%.Body mass index is 30.84 kg/(m^2).  General Appearance: Casual and Guarded  Eye Contact::  Fair  Speech:  Pressured  Volume:  Increased  Mood:  Irritable  Affect:  Constricted and Labile  Thought Process:  Circumstantial  Orientation:  Full (Time, Place, and Person)  Thought Content:  Rumination  Suicidal Thoughts:  No  Homicidal Thoughts:  No  Memory:  Immediate;   Fair Recent;   Fair Remote;   Fair  Judgement:  Intact  Insight:  Fair  Psychomotor Activity:  Increased  Concentration:  Fair  Recall:  [employer] of Knowledge:Fair  Language: Good  Akathisia:  No  Handed:  Right  AIMS (if indicated):     Assets:  Communication Skills Desire for Improvement Housing Resilience Social Support  Sleep:      Musculoskeletal: Strength & Muscle Tone: within normal limits Gait & Station: patient lying on bed Patient leans: N/A  Treatment Plan Summary: Medication management I have discussed in detail about side effects of lithium. At this time patient wants to continue depakote at present dose and not interested in adding more medication. He like to discussed with his wife and upon discharge with Dr Emelda Brothers for medication  changes.  He can try Lamictal, Trileptal or Geodon but he like to discussed with his psychiatrist. Please call [phone] if you have any question.   Vong Garringer T. 07/26/2013 6:29 PM

## 2013-07-26 NOTE — Progress Notes (Signed)
EEG Completed; Results Pending  

## 2013-07-26 NOTE — ED Notes (Signed)
Wife, Vaughan Basta, [phone] would like updates.

## 2013-07-26 NOTE — H&P (Addendum)
PCP:   [name], MD   Chief Complaint:  Seizure  HPI:  63 year old male with history of bipolar disorder, renal cell carcinoma status post chemotherapy but therapy and nephrectomy, GERD, hypertension who was recently diagnosed with metastatic spot in the liver and underwent recent liver surgery for tumor excision. Patient was told that cancer was excised with good margins and 25% of the liver was removed. Patient was transferred from [facility] to [place] on 07/23/13. Patient has extensive psychiatric history, and has been on multiple psych medications included lithium and Depakote. As the patient the lithium was stopped by he was at [facility] and he is currently not taking lithium. Today patient was found to have 3 seizures, surprisingly patient is able to remember these events and he describes that his arms went up in the air and it was shaking. Patient had one seizure in the ambulance and fifth seizure while he was in the ED. Last 2 seizures were witnessed. Patient received Ativan and 1500 mg of Keppra in the ED. Patient is not post at this time, he'll he is diapers as he has anasarca since his surgery and Lasix has been tried as per [facility] physicians but held due to worsening renal functions. At this time he denies chest pain, no shortness of breath no nausea vomiting or diarrhea no fever no dysuria urgency or frequency of urination. Patient also was on Primidone, which made him shake so that was stopped. Patient told me that , "I  was concerned of getting epilepsy as one of the side effects of primidone is epilepsy."  Allergies:  No Known Allergies    Past Medical History  Diagnosis Date  . Renal cell carcinoma     chemo and xrt--lost rt kidney  . Childhood asthma   . Sinusitis   . Cellulitis     after left foot surgery  . Osteoarthritis   . OSA on CPAP     NPSG 05-08-07 AHI 63.1/hr,desat t0 80%/loud snoring  . Bipolar 1 disorder   . Hyperlipemia   . Mental disorder   . Depression   .  GERD (gastroesophageal reflux disease)   . Hypertension   . Allergy   . Anxiety   . Cataract   . Neuromuscular disorder   . Osteoporosis   . Thyroid disease     Past Surgical History  Procedure Laterality Date  . Foot surgery      left foot toes,   . Bunionectomy    . 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 Cholecystectomy    . Colon surgery    . Kidney surgery  1998    Rt Kidney Removal     Prior to Admission medications   Medication Sig Start Date End Date Taking? Authorizing Provider  amLODipine (NORVASC) 10 MG tablet Take 10 mg by mouth daily.   Yes Historical Provider, MD  baclofen (LIORESAL) 10 MG tablet Take 10 mg by mouth 3 (three) times daily.   Yes Historical Provider, MD  buPROPion (WELLBUTRIN SR) 200 MG 12 hr tablet Take 200 mg by mouth 2 (two) times daily.   Yes Historical Provider, MD  divalproex (DEPAKOTE ER) 250 MG 24 hr tablet Take 750 mg by mouth daily.   Yes Historical Provider, MD  docusate sodium (COLACE) 100 MG capsule Take 100 mg by mouth 2 (two) times daily.   Yes Historical Provider, MD  FA-B6-B12-OMEGA 3-BIOTIN-CR PO Take 1 capsule by mouth every morning.   Yes Historical Provider,  MD  levothyroxine (SYNTHROID, LEVOTHROID) 50 MCG tablet Take 50 mcg by mouth daily before breakfast.   Yes Historical Provider, MD  lithium carbonate (LITHOBID) 300 MG CR tablet Take 1 tablet (300 mg total) by mouth every evening. 09/09/11  Yes Clanford Marisa Hua, MD  magnesium hydroxide (MILK OF MAGNESIA) 400 MG/5ML suspension Take 15 mLs by mouth daily as needed for moderate constipation.   Yes Historical Provider, MD  OVER THE COUNTER MEDICATION Take 1 application by mouth continuous.    Yes Historical Provider, MD  pantoprazole (PROTONIX) 40 MG tablet Take 40 mg by mouth daily.   Yes Historical Provider, MD  propranolol (INDERAL) 20 MG tablet Take 20 mg by mouth 3 (three) times daily.   Yes Historical Provider, MD  sennosides-docusate sodium (SENOKOT-S) 8.6-50 MG tablet  Take 2 tablets by mouth 2 (two) times daily.   Yes Historical Provider, MD  tamsulosin (FLOMAX) 0.4 MG CAPS capsule Take 0.4 mg by mouth daily after breakfast.   Yes Historical Provider, MD    Social History:  reports that he has never smoked. He has never used smokeless tobacco. He reports that he drinks alcohol. He reports that he does not use illicit drugs.  Family History  Problem Relation Age of Onset  . Hypertension    . Sleep apnea    . Emphysema    . Prostate cancer    . Arthritis Father   . Colon cancer Father      All the positives are listed in BOLD  Review of Systems:  HEENT: Headache, blurred vision, runny nose, sore throat Neck: Hypothyroidism, hyperthyroidism,,lymphadenopathy Chest : Shortness of breath, history of COPD, Asthma Heart : Chest pain, history of coronary arterey disease GI:  Nausea, vomiting, diarrhea, constipation, GERD GU: Dysuria, urgency, frequency of urination, hematuria Neuro: Stroke, seizures, syncope Psych: Depression, anxiety, hallucinations   Physical Exam: Blood pressure 114/82, pulse 81, temperature 97.8 F (36.6 C), resp. rate 15, SpO2 100.00%. Constitutional:   Patient is a well-developed and well-nourished male* in no acute distress and cooperative with exam. Head: Normocephalic and atraumatic Mouth: Mucus membranes moist Eyes: PERRL, EOMI, conjunctivae normal Neck: Supple, No Thyromegaly Cardiovascular: RRR, S1 normal, S2 normal Pulmonary/Chest: CTAB, no wheezes, rales, or rhonchi Abdominal: Soft. Non-tender, non-distended, bowel sounds are normal, no masses, organomegaly, or guarding present.  Neurological: A&O x3, Strenght is normal and symmetric bilaterally, cranial nerve II-XII are grossly intact, no focal motor deficit, sensory intact to light touch bilaterally.  GU -scrotal edema  Extremities :  bilateral 2+ pitting edema of the lower extremities   Labs on Admission:  Results for orders placed during the hospital  encounter of 07/25/13 (from the past 48 hour(s))  CBC WITH DIFFERENTIAL     Status: Abnormal   Collection Time    07/25/13  9:22 PM      Result Value Range   WBC 7.6  4.0 - 10.5 K/uL   RBC 3.06 (*) 4.22 - 5.81 MIL/uL   Hemoglobin 9.6 (*) 13.0 - 17.0 g/dL   HCT 29.8 (*) 39.0 - 52.0 %   MCV 97.4  78.0 - 100.0 fL   MCH 31.4  26.0 - 34.0 pg   MCHC 32.2  30.0 - 36.0 g/dL   RDW 15.2  11.5 - 15.5 %   Platelets 378  150 - 400 K/uL   Neutrophils Relative % 78 (*) 43 - 77 %   Neutro Abs 5.9  1.7 - 7.7 K/uL   Lymphocytes Relative 6 (*) 12 -  46 %   Lymphs Abs 0.4 (*) 0.7 - 4.0 K/uL   Monocytes Relative 7  3 - 12 %   Monocytes Absolute 0.5  0.1 - 1.0 K/uL   Eosinophils Relative 9 (*) 0 - 5 %   Eosinophils Absolute 0.6  0.0 - 0.7 K/uL   Basophils Relative 0  0 - 1 %   Basophils Absolute 0.0  0.0 - 0.1 K/uL  COMPREHENSIVE METABOLIC PANEL     Status: Abnormal   Collection Time    07/25/13  9:22 PM      Result Value Range   Sodium 142  137 - 147 mEq/L   Potassium 5.5 (*) 3.7 - 5.3 mEq/L   Chloride 102  96 - 112 mEq/L   CO2 22  19 - 32 mEq/L   Glucose, Bld 140 (*) 70 - 99 mg/dL   BUN 30 (*) 6 - 23 mg/dL   Creatinine, Ser 2.54 (*) 0.50 - 1.35 mg/dL   Calcium 8.9  8.4 - 10.5 mg/dL   Total Protein 6.6  6.0 - 8.3 g/dL   Albumin 2.7 (*) 3.5 - 5.2 g/dL   AST 30  0 - 37 U/L   ALT 32  0 - 53 U/L   Alkaline Phosphatase 99  39 - 117 U/L   Total Bilirubin <0.2 (*) 0.3 - 1.2 mg/dL   GFR calc non Af Amer 26 (*) >90 mL/min   GFR calc Af Amer 30 (*) >90 mL/min   Comment: (NOTE)     The eGFR has been calculated using the CKD EPI equation.     This calculation has not been validated in all clinical situations.     eGFR's persistently <90 mL/min signify possible Chronic Kidney     Disease.  LACTIC ACID, PLASMA     Status: Abnormal   Collection Time    07/25/13  9:22 PM      Result Value Range   Lactic Acid, Venous 6.7 (*) 0.5 - 2.2 mmol/L  AMMONIA     Status: None   Collection Time    07/25/13   9:23 PM      Result Value Range   Ammonia 26  11 - 60 umol/L  VALPROIC ACID LEVEL     Status: Abnormal   Collection Time    07/25/13  9:35 PM      Result Value Range   Valproic Acid Lvl 22.4 (*) 50.0 - 100.0 ug/mL  MAGNESIUM     Status: Abnormal   Collection Time    07/25/13  9:35 PM      Result Value Range   Magnesium 3.0 (*) 1.5 - 2.5 mg/dL  LITHIUM LEVEL     Status: Abnormal   Collection Time    07/25/13  9:42 PM      Result Value Range   Lithium Lvl <0.25 (*) 0.80 - 1.40 mEq/L  GLUCOSE, CAPILLARY     Status: Abnormal   Collection Time    07/25/13  9:51 PM      Result Value Range   Glucose-Capillary 130 (*) 70 - 99 mg/dL  POCT I-STAT, CHEM 8     Status: Abnormal   Collection Time    07/25/13 10:09 PM      Result Value Range   Sodium 140  137 - 147 mEq/L   Potassium 4.8  3.7 - 5.3 mEq/L   Chloride 102  96 - 112 mEq/L   BUN 28 (*) 6 - 23 mg/dL   Creatinine, Ser 2.50 (*)  0.50 - 1.35 mg/dL   Glucose, Bld 130 (*) 70 - 99 mg/dL   Calcium, Ion 1.19  1.13 - 1.30 mmol/L   TCO2 23  0 - 100 mmol/L   Hemoglobin 11.9 (*) 13.0 - 17.0 g/dL   HCT 35.0 (*) 39.0 - 52.0 %    Radiological Exams on Admission: Ct Head Wo Contrast  07/25/2013   CLINICAL DATA:  Seizures.  Left-sided weakness.  EXAM: CT HEAD WITHOUT CONTRAST  TECHNIQUE: Contiguous axial images were obtained from the base of the skull through the vertex without intravenous contrast.  COMPARISON:  CT HEAD W/O CM dated 09/06/2011  FINDINGS: Mild cerebral atrophy, similar to prior study. No acute intracranial abnormality. Specifically, no hemorrhage, hydrocephalus, mass lesion, acute infarction, or significant intracranial injury. No acute calvarial abnormality. Visualized paranasal sinuses and mastoids clear. Orbital soft tissues unremarkable.  IMPRESSION: No acute intracranial abnormality.   Electronically Signed   By: Rolm Baptise M.D.   On: 07/25/2013 21:14   Dg Chest Portable 1 View  07/25/2013   CLINICAL DATA:  Seizures.  EXAM:  PORTABLE CHEST - 1 VIEW  COMPARISON:  DG CHEST 2V dated 04/20/2013; DG ABDOMEN ACUTE 2V W/ 1V CHEST dated 07/05/2012; DG CHEST 2V dated 04/03/2012; DG CHEST 1V PORT dated 04/14/2008  FINDINGS: Lung volumes are lower than on prior. Apical lordotic projection. Monitoring leads project over the chest. The cardiopericardial silhouette appears within normal limits. No airspace disease. Mild left basilar atelectasis. Mediastinal contours are within normal limits allowing for volumes of inspiration.  IMPRESSION: No active disease.   Electronically Signed   By: Dereck Ligas M.D.   On: 07/25/2013 21:19    Assessment/Plan Principal Problem:   New onset seizure Active Problems:   MALIGNANT NEOPLASM OF KIDNEY EXCEPT PELVIS   BIPOLAR DISORDER UNSPECIFIED   CKD (chronic kidney disease), stage III   Seizure  New-onset seizure  Patient was seen by neurology and this time they have increased the dose of Depakote to 750 mg twice a day as the Depakote level is 22.4 which is subtherapeutic. Patient did receive dose of Keppra in the ED. We'll admit the patient stepped down and monitor him closely for seizure. Will order MRI of the brain as well as EEG as per neurology recommendation.   Bipolar disorder  Stable this time continue with Depakote. Consider psych consult in the morning to  adjust the patient's medications. Patient has not been taking lithium for last 4 weeks. We'll not resume lithium at this time.  Anasarca  Patient has significant edema of the lower extremities, albumin level is 2.7. Was started on protein supplement 3 times a day.  C KD stage III  Patient's creatinine is 2.5, as per records his last creatinine was 2.17 from November 2014.continue to monitor in the hospital   Lactic Acidosis Patient's lactic acid is 6.7, likely due to seizure. We'll repeat lactate in the morning.  Hypothyroidism Continue Synthroid  Constipation We'll start Dulcolax suppository, continue with Senokot S. tablet  twice a day.  Code status:Patient is full code, but does not want to be on long-term life support.  Family discussion:Discussed with patient in detail   Time Spent on Admission: 75 min  [facility] Pager: [phone] 07/26/2013, 12:55 AM  If 7PM-7AM, please contact night-coverage  [email or web address]  Password [email or web address]

## 2013-07-26 NOTE — Progress Notes (Signed)
Subjective: No recurrence of seizure activity overnight reported. Patient had no other complaints.  Objective: Current vital signs: BP 128/72  Pulse 63  Temp(Src) 97.9 F (36.6 C) (Oral)  Resp 18  Ht 6\' 1"  (1.854 m)  Wt 106 kg (233 lb 11 oz)  BMI 30.84 kg/m2  SpO2 100%  Neurologic Exam: Alert and in no acute distress. Affect somewhat depressed. Mental status is otherwise unremarkable. Extraocular movements and speech were normal. No facial weakness was noted. Patient moved extremities equally with normal strength.  Medications: I have reviewed the patient's current medications.  Assessment/Plan: New-onset seizure activity of unclear etiology. EEG and MRI of the brain pending. Patient is currently on Depakote ER which has been increased from 500 mg per day to 750 mg per day.  Recommend no changes in current management. We will continue to follow this patient with you.  C.R. Nicole Kindred, MD [facility] [phone]  07/26/2013  9:06 AM

## 2013-07-26 NOTE — Progress Notes (Signed)
Placed pt. On cpap. Pt. Was unable to get his home unit here so he agreed to wear cpap from out department. Pt. Is tolerating well. No issues at this time.

## 2013-07-26 NOTE — Procedures (Signed)
ELECTROENCEPHALOGRAM REPORT  Patient: [name]       Room #: 4V40 EEG ID:  [id number] Age: 63 y.o.        Sex: male Referring Physician:  Dr. Thereasa Solo Report Date:  07/26/2013        Interpreting Physician: Anthony Sar  History: [name] is an 63 y.o. male  with a history of liver cancer with surgical excision, presenting with new-onset focal seizures.  Indications for study:   rule out new-onset seizure disorder.  Technique: This is an 18 channel routine scalp EEG performed at the bedside with bipolar and monopolar montages arranged in accordance to the international 10/20 system of electrode placement.   Description: this EEG recording was performed during wakefulness and during sleep. Predominant background activity during wakefulness consisted of low amplitude diffuse 20-25 Hz symmetrical beta activity. Frequent occurrences of 9-10 Hz alpha rhythm were recorded from the posterior head regions. Photic stimulation was not performed. Hyperventilation was not performed. There was slowing of background activity diffusely and symmetrically during sleep as well as symmetrical vertex waves, sleep spindles and arousal responses during stage II of sleep. No epileptiform discharges occurred during wakefulness during sleep. There was no abnormal slowing of cerebral activity.   Interpretation: This is a normal EEG recording during wakefulness and during sleep. No evidence of an epileptic disorder was demonstrated.    Rush Farmer M.D. [facility] [phone]

## 2013-07-26 NOTE — Progress Notes (Signed)
UR completed.  Ryanna Teschner, RN BSN MHA CCM Trauma/Neuro ICU Case Manager [phone]  

## 2013-07-27 ENCOUNTER — Inpatient Hospital Stay (HOSPITAL_COMMUNITY): Payer: 59

## 2013-07-27 DIAGNOSIS — I82403 Acute embolism and thrombosis of unspecified deep veins of lower extremity, bilateral: Secondary | ICD-10-CM | POA: Diagnosis present

## 2013-07-27 DIAGNOSIS — R609 Edema, unspecified: Secondary | ICD-10-CM

## 2013-07-27 LAB — BASIC METABOLIC PANEL
BUN: 25 mg/dL — ABNORMAL HIGH (ref 6–23)
CALCIUM: 8.9 mg/dL (ref 8.4–10.5)
CO2: 26 meq/L (ref 19–32)
Chloride: 104 mEq/L (ref 96–112)
Creatinine, Ser: 2.16 mg/dL — ABNORMAL HIGH (ref 0.50–1.35)
GFR calc Af Amer: 36 mL/min — ABNORMAL LOW (ref >90)
GFR calc non Af Amer: 31 mL/min — ABNORMAL LOW (ref >90)
Glucose, Bld: 78 mg/dL (ref 70–99)
POTASSIUM: 4.6 meq/L (ref 3.7–5.3)
SODIUM: 142 meq/L (ref 137–147)

## 2013-07-27 LAB — CBC
HCT: 28.7 % — ABNORMAL LOW (ref 39.0–52.0)
Hemoglobin: 9.1 g/dL — ABNORMAL LOW (ref 13.0–17.0)
MCH: 31.2 pg (ref 26.0–34.0)
MCHC: 31.7 g/dL (ref 30.0–36.0)
MCV: 98.3 fL (ref 78.0–100.0)
PLATELETS: 289 10*3/uL (ref 150–400)
RBC: 2.92 MIL/uL — AB (ref 4.22–5.81)
RDW: 15.6 % — ABNORMAL HIGH (ref 11.5–15.5)
WBC: 4.3 10*3/uL (ref 4.0–10.5)

## 2013-07-27 MED ORDER — FUROSEMIDE 10 MG/ML IJ SOLN
40.0000 mg | Freq: Once | INTRAMUSCULAR | Status: AC
  Administered 2013-07-27: 40 mg via INTRAVENOUS
  Filled 2013-07-27: qty 4

## 2013-07-27 MED ORDER — ENOXAPARIN SODIUM 100 MG/ML ~~LOC~~ SOLN
100.0000 mg | Freq: Two times a day (BID) | SUBCUTANEOUS | Status: DC
  Administered 2013-07-27 – 2013-08-03 (×14): 100 mg via SUBCUTANEOUS
  Filled 2013-07-27 (×15): qty 1

## 2013-07-27 MED ORDER — POTASSIUM CHLORIDE CRYS ER 20 MEQ PO TBCR
40.0000 meq | EXTENDED_RELEASE_TABLET | Freq: Once | ORAL | Status: AC
  Administered 2013-07-27: 40 meq via ORAL
  Filled 2013-07-27: qty 2

## 2013-07-27 MED ORDER — ENOXAPARIN SODIUM 60 MG/0.6ML ~~LOC~~ SOLN
60.0000 mg | SUBCUTANEOUS | Status: AC
  Administered 2013-07-27: 60 mg via SUBCUTANEOUS
  Filled 2013-07-27: qty 0.6

## 2013-07-27 NOTE — Consult Note (Signed)
PHARMACY CONSULT NOTE - Initial Consult  Pharmacy Consult for : Lovenox Indication: Bilateral DVT's   Dosing weight 106 kg  Vital Signs: BP 100/60  Pulse 57  Temp(Src) 97.9 F (36.6 C) (Oral)  Resp 20  Ht 6\' 1"  (1.854 m)  Wt 233 lb 11 oz (106 kg)  BMI 30.84 kg/m2  SpO2 97%  Labs:  Recent Labs  07/25/13 2122 07/25/13 2209 07/26/13 0335 07/27/13 0252  HGB 9.6* 11.9* 8.8* 9.1*  HCT 29.8* 35.0* 26.9* 28.7*  PLT 378  --  360 289  CREATININE 2.54* 2.50* 2.28* 2.16*   Estimated Creatinine Clearance: 45.3 ml/min (by C-G formula based on Cr of 2.16).  Medical History: Past Medical History  Diagnosis Date  . Renal cell carcinoma     chemo and xrt--lost rt kidney  . Childhood asthma   . Sinusitis   . Cellulitis     after left foot surgery  . Osteoarthritis   . OSA on CPAP     NPSG 05-08-07 AHI 63.1/hr,desat t0 80%/loud snoring  . Bipolar 1 disorder   . Hyperlipemia   . Mental disorder   . Depression   . GERD (gastroesophageal reflux disease)   . Hypertension   . Allergy   . Anxiety   . Cataract   . Neuromuscular disorder   . Osteoporosis   . Thyroid disease    Past Surgical History  Procedure Laterality Date  . Foot surgery      left foot toes,   . Bunionectomy    . 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 Cholecystectomy    . Colon surgery    . Kidney surgery  1998    Rt Kidney Removal     Current Medication[s] Include: Scheduled:  Scheduled:  . amLODipine  10 mg Oral Daily  . baclofen  10 mg Oral TID  . divalproex  750 mg Oral Q12H  . enoxaparin (LOVENOX) injection  100 mg Subcutaneous Q12H  . enoxaparin  60 mg Subcutaneous NOW  . levothyroxine  50 mcg Oral QAC breakfast  . pantoprazole  40 mg Oral Daily  . propranolol  20 mg Oral TID  . protein supplement  1 scoop Oral BID WC  . sennosides-docusate sodium  2 tablet Oral BID  . sodium chloride  3 mL Intravenous Q12H  . tamsulosin  0.4 mg Oral QPC breakfast   Assessment:  32 male patient  with multiple medical problems including bipolar disorder, renal cell carcinoma status post chemotherapy and nephrectomy, hypertension, and recent tumor excision for mass on the liver.with newly discovered Bilateral DVT's.  Lovenox will be changed to treatment doses.  CrCl ~ 45 ml/min.  CT [ - ] for hemorrhaging.  Goal of Therapy:  Lovenox Anti-Xa level 0.6-1 units/ml 4hrs after LMWH dose given .  Plan:  1. DC Lovenox 40 mg sq daily.   2. Give additional 60 mg Lovenox for completing 1st treatment dose of Lovenox, then 3. Lovenox 100 mg sq q 12 hours. 4. CBC q 3 days, Monitor for bleeding complications   Estelle June, Pharm.D. 07/27/2013,  12:37 PM

## 2013-07-27 NOTE — Progress Notes (Signed)
Physical medicine and rehabilitation consult requested. Await formal therapy evaluations and recommendations. Patient just had bilateral lower extremity Dopplers positive for DVT patient currently on bed rest.

## 2013-07-27 NOTE — Progress Notes (Signed)
Subjective: No recurrence of seizure activity reported. Patient had no overnight adverse clinical events.  Objective: Current vital signs: BP 100/60  Pulse 56  Temp(Src) 98 F (36.7 C) (Oral)  Resp 18  Ht 6\' 1"  (1.854 m)  Wt 106 kg (233 lb 11 oz)  BMI 30.84 kg/m2  SpO2 98%  Neurologic Exam: Alert and in no acute distress. Patient is well-oriented to time as well as place. Speech was normal. Patient was moderately tremulous but indicated that this is a chronic condition and unchanged. He moved extremities equally with symmetrical strength.  EEG on 07/26/2013 was normal. MRI of the brain is pending.  Medications: I have reviewed the patient's current medications.  Assessment/Plan: New-onset focal seizure activity of unclear etiology. Examination remains unremarkable with no significant focal findings. EEG was normal with no signs of seizure activity. New-onset seizure disorder cannot be ruled out, however. MRI of the brain is pending. He is currently on Depakote ER 750 mg per day and tolerating this medication well.  Recommend no changes in current management. We will continue to follow this patient with you.  C.R. Nicole Kindred, MD [facility]  07/27/2013  9:48 AM

## 2013-07-27 NOTE — Progress Notes (Signed)
PT Cancellation Note  Patient Details Name: VALENTIN BENNEY MRN: [id number] DOB: [birth year]   Cancelled Treatment:    Reason Eval/Treat Not Completed: Patient not medically ready. Pt just had bilat LE doppler and was + for bilat LE DVTs. Pt on hold. PT to return when pt able to participate in PT.   Kingsley Callander 07/27/2013, 11:44 AM

## 2013-07-27 NOTE — Progress Notes (Signed)
[place] / ICU Progress Note  Sigmond Patalano Couillard WNU:[id number] DOB: 1951/05/19 DOA: 07/25/2013 PCP: [name], MD  Brief narrative: 29 male patient with multiple medical problems including bipolar disorder, renal cell carcinoma status post chemotherapy and nephrectomy, hypertension, and recent tumor excision for mass on the liver. The latter procedure had been performed at [facility] and patient was significantly deconditioned after this procedure so was discharged to [facility] skilled nursing facility on 07/23/2013.  Patient apparently had 3 seizures at his SNF prior to arrival to the emergency department. The patient was apparently aware of at least one episode in which he remembers his arms being raised above his head that his head turning to the left with the episode apparently lasting 2 minutes. He does not recall any further episodes. EMS reported 2 additional episodes prior to their arrival and then EMS witnessed an additional seizure at which point the patient turned his head to the left and had generalized tonic-clonic activity of the upper extremities. He was subsequently given 5 mg of Versed by EMS which aborted the activiyt. Upon arrival to the emergency department he was postictal with left-sided weakness.  Patient has an extensive psychiatric history. Because of his acute renal failure on chronic kidney disease the lithium he had been on  for many years was discontinued prior to discharge from [place]. He had also recently been started on primidone prior to discharge from [facility]. Patient reported to the admitting physician that "I was concerned of getting epilepsy as one of the side effects from primidone is epilepsy".  Patient reported significant anasarca including lower extremity edema had been ongoing since his surgery at [facility]. This was attributed to his renal function according to the patient.  Assessment/Plan:  New onset seizure -Neurology following - recommend  discontinuing Wellbutrin - increased Depakote dose  -MRI pending -EEG without current seizure activity and otherwise is normal -no further seizures since admit   Bilateral DVT/bilateral LE edema -extensive noting extends bilaterally from PTV to CFV -suspect recent surgery/immobilization contributing/etiologic factors and also has been on chronic testosterone injections every two weeks-? Malignancy but path from recent surgery still pending -change to full dose Lovenox -would like to use NOAC- need to clarify with CM re: co pay but also clarify if can be used in setting of DVT if determined to be due to malignancy- also use may be limited by low GFR -Lasix w/ Kdur x 1 for scrotal edema  Recent resection liver mass (06/30/13) -at [facility]- path pending in EMR-pt endorses he was given results that were "benign"  OBSTRUCTIVE SLEEP APNEA -Resume hour of sleep CPAP  Bipolar 1 disorder, mixed -Lithium discontinued prior to discharge from [place] -Review of outpatient records reveals patient has had extensive manic episodes when off of this medication therefore we'll ask Psychiatry to weigh in on medication options especially since we are also having to now discontinue Wellbutrin  -pt has requested his psychiatrist review med recs from IP psych MD and make recommendations to IM team and talk with pt if possible- msg left for Dr. Casimiro Needle  CKD (chronic kidney disease), stage III -Stable compared to August 2014 data -had ARF while at [facility] in Jan and required CVVHD -pt still needs to establish w/OP nephro- was previously referred to Dr. Lorrene Reid   Grade 1 diastolic dysfunction -Seen on echo 2014 and currently compensated  MALIGNANT NEOPLASM OF KIDNEY EXCEPT PELVIS (INTIAL DX/SURGERY [place] 2002/2003) -no recurrence as of 2011 but recent  resection of liver mass Jan 2015 done due to highly suspicious mass ? new met from [facility] but pt says was told pathology was "benign" -MRI of brain pending to  rule out metastatic neoplasm -followed by Signa Kell  Hypothyroidism -Synthroid  Physical deconditioning -Patient prefers not to return to [facility] and requests bed search for new skilled nursing facility prior to discharge -CIR eval once can dc bedrest  Anemia, chronic disease -Hemoglobin stable  DVT prophylaxis: Full dose Lovenox Code Status: Full Family Communication: Relatives at bedside- returned tod/w pt results of duplex and treatment options/expectations Disposition Plan/Expected LOS: Transfer to floor bed   Consultants: Neurology  Procedures: Bilateral lower extremity venous duplex  Preliminary: Right: DVT noted in the saphenofemoral junction, common femoral, femoral, popliteal, and posterior tibial veins. There appears to be superficial thrombosis in the greater saphenous vein. No Baker's cyst. Left: DVT noted in the common femoral, femoral, popliteal, and posterior tibial veins. No evidence of superficial thrombosis. No Baker's cyst. Technically difficult study due to the patient's body habitus.  Antibiotics: None  HPI/Subjective: Patient alert. Questions answered. Says finally sleeping better. Hungry.  Objective: Blood pressure 110/48, pulse 57, temperature 97.9 F (36.6 C), temperature source Oral, resp. rate 20, height 6' 1"  (1.854 m), weight 233 lb 11 oz (106 kg), SpO2 97.00%.  Intake/Output Summary (Last 24 hours) at 07/27/13 1302 Last data filed at 07/27/13 1200  Gross per 24 hour  Intake   1060 ml  Output   1975 ml  Net   -915 ml    Exam: General: No acute respiratory distress Lungs: Clear to auscultation bilaterally without wheezes or crackles, RA Cardiovascular: Regular rate and rhythm without murmur gallop or rub normal S1 and S2, 2-3 + bilateral peripheral edema without  JVD Abdomen: Nontender, nondistended, soft, bowel sounds positive, no rebound, no ascites, no appreciable mass Musculoskeletal: No significant cyanosis, clubbing of bilateral  lower extremities Neurological: Alert and oriented x 3, moves all extremities x 4 without focal neurological deficits, CN 2-12 intact  Scheduled Meds:  Scheduled Meds: . amLODipine  10 mg Oral Daily  . baclofen  10 mg Oral TID  . divalproex  750 mg Oral Q12H  . enoxaparin (LOVENOX) injection  100 mg Subcutaneous Q12H  . enoxaparin  60 mg Subcutaneous NOW  . levothyroxine  50 mcg Oral QAC breakfast  . pantoprazole  40 mg Oral Daily  . propranolol  20 mg Oral TID  . protein supplement  1 scoop Oral BID WC  . sennosides-docusate sodium  2 tablet Oral BID  . sodium chloride  3 mL Intravenous Q12H  . tamsulosin  0.4 mg Oral QPC breakfast    Data Reviewed: Basic Metabolic Panel:  Recent Labs Lab 07/25/13 2122 07/25/13 2135 07/25/13 2209 07/26/13 0335 07/27/13 0252  NA 142  --  140 141 142  K 5.5*  --  4.8 4.9 4.6  CL 102  --  102 104 104  CO2 22  --   --  25 26  GLUCOSE 140*  --  130* 118* 78  BUN 30*  --  28* 29* 25*  CREATININE 2.54*  --  2.50* 2.28* 2.16*  CALCIUM 8.9  --   --  8.8 8.9  MG  --  3.0*  --   --   --    Liver Function Tests:  Recent Labs Lab 07/25/13 2122 07/26/13 0335  AST 30 27  ALT 32 31  ALKPHOS 99 91  BILITOT <0.2* <0.2*  PROT 6.6 6.2  ALBUMIN 2.7* 2.5*    Recent Labs Lab 07/25/13 2123  AMMONIA 26   CBC:  Recent Labs Lab 07/25/13 2122 07/25/13 2209 07/26/13 0335 07/27/13 0252  WBC 7.6  --  5.1 4.3  NEUTROABS 5.9  --   --   --   HGB 9.6* 11.9* 8.8* 9.1*  HCT 29.8* 35.0* 26.9* 28.7*  MCV 97.4  --  96.8 98.3  PLT 378  --  360 289   CBG:  Recent Labs Lab 07/25/13 2151  GLUCAP 130*    Recent Results (from the past 240 hour(s))  CULTURE, BLOOD (ROUTINE X 2)     Status: None   Collection Time    07/25/13  8:58 PM      Result Value Range Status   Specimen Description BLOOD LEFT HAND   Final   Special Requests BOTTLES DRAWN AEROBIC AND ANAEROBIC 5CC EACH   Final   Culture  Setup Time     Final   Value: 07/26/2013 04:12      Performed at [facility]   Culture     Final   Value:        BLOOD CULTURE RECEIVED NO GROWTH TO DATE CULTURE WILL BE HELD FOR 5 DAYS BEFORE ISSUING A FINAL NEGATIVE REPORT     Performed at [facility]   Report Status PENDING   Incomplete  CULTURE, BLOOD (ROUTINE X 2)     Status: None   Collection Time    07/25/13  9:12 PM      Result Value Range Status   Specimen Description BLOOD RIGHT ARM   Final   Special Requests BOTTLES DRAWN AEROBIC ONLY 10CC   Final   Culture  Setup Time     Final   Value: 07/26/2013 04:12     Performed at [facility]   Culture     Final   Value:        BLOOD CULTURE RECEIVED NO GROWTH TO DATE CULTURE WILL BE HELD FOR 5 DAYS BEFORE ISSUING A FINAL NEGATIVE REPORT     Performed at [facility]   Report Status PENDING   Incomplete  MRSA PCR SCREENING     Status: None   Collection Time    07/26/13  1:41 AM      Result Value Range Status   MRSA by PCR NEGATIVE  NEGATIVE Final   Comment:            The GeneXpert MRSA Assay (FDA     approved for NASAL specimens     only), is one component of a     comprehensive MRSA colonization     surveillance program. It is not     intended to diagnose MRSA     infection nor to guide or     monitor treatment for     MRSA infections.     Studies:  Recent x-ray studies have been reviewed in detail by the Attending Physician  Time spent : 25+ mins     Erin Hearing, ANP [facility] Office  [phone] Pager [phone]  **If unable to reach the above provider after paging please contact the [place] @ [phone]  On-Call/Text Page:      [email or web address]      password [email or web address]  If 7PM-7AM, please contact night-coverage [email or web address] Password [email or web address] 07/27/2013, 1:02 PM   LOS: 2 days    I have examined the patient, reviewed the chart and modified the above note which I agree with.  Znya Albino,MD [phone] 07/27/2013, 5:04 PM

## 2013-07-27 NOTE — Clinical Social Work Note (Signed)
Clinical Social Worker continuing to follow patient and family for support and discharge planning needs.  Patient states that he would prefer placement at inpatient rehab and transition home if at all possible.  MD has placed inpatient rehab consult - CSW to follow.  Patient remains in agreement with return to [facility] for SNF back up and does not request initiating a new search at this time.  Patient frequently changes his mind regarding placement so consistent follow up regarding SNF choice would be beneficial.  CSW remains available for support and to assist with discharge planning needs once medically stable.  Barbette Or, [place]

## 2013-07-27 NOTE — Telephone Encounter (Signed)
Spoke to patient advised him will make sure Dr Everlene Farrier knows he is at [facility] now. He indicates he does not want to go back to [facility]. He states also he wants rolling walker with platforms to make ambulation easier. I have advised him to ask social worker to help with placement. He will ask if [facility] or [facility] has any availability and will accept him. He also wants to know what we can do about his follow up with [facility], can he get someone locally to see him? He has stitches that will need to come out soon. I told him will speak to Dr Everlene Farrier and then get back to him. He wants me to call on his cell.

## 2013-07-27 NOTE — Progress Notes (Addendum)
Bilateral lower extremity venous duplex completed.  Right:  DVT noted in the saphenofemoral junction, common femoral, femoral, popliteal, and posterior tibial veins.  There appears to be superficial thrombosis in the greater saphenous vein.  No Baker's cyst.  Left: DVT noted in the common femoral, femoral, popliteal, and posterior tibial veins.  No evidence of superficial thrombosis.  No Baker's cyst.  Technically difficult study due to the patient's body habitus.

## 2013-07-27 NOTE — Telephone Encounter (Signed)
Place, in no the surgeon will instruct [place] as to when he needs to get his clips out. We can help him with that. He needs to work with the Education officer, museum regarding what type of walker he needs as well as where he will be taken care of once he leaves the hospital.

## 2013-07-28 DIAGNOSIS — D638 Anemia in other chronic diseases classified elsewhere: Secondary | ICD-10-CM

## 2013-07-28 DIAGNOSIS — I82409 Acute embolism and thrombosis of unspecified deep veins of unspecified lower extremity: Secondary | ICD-10-CM

## 2013-07-28 NOTE — Progress Notes (Signed)
Await formal physical and occupational therapy evaluations to be completed to establish appropriate discharge planning. As per note patient had been at [facility] facility upon his admission to [facility]

## 2013-07-28 NOTE — Progress Notes (Signed)
OT Cancellation Note  Patient Details Name: [name] MRN: [id number] DOB: 1950-06-28   Cancelled Treatment:    Reason Eval/Treat Not Completed: Medical issues which prohibited therapy Currently with bedrest orders due to BLE DVT. Please update activity orders when able to participate.  [place], OTR/L  [phone] 07/28/2013 07/28/2013, 10:50 AM

## 2013-07-28 NOTE — Progress Notes (Signed)
[place] / ICU Progress Note  [name] ZDG:[id number] DOB: 02-Jan-1951 DOA: 07/25/2013 PCP: [name], MD  Brief narrative: 31 male patient with multiple medical problems including bipolar disorder, renal cell carcinoma status post chemotherapy and nephrectomy, hypertension, and recent tumor excision for mass on the liver. The latter procedure had been performed at [facility] and patient was significantly deconditioned after this procedure so was discharged to [facility] skilled nursing facility on 07/23/2013.  Patient apparently had 3 seizures at his SNF prior to arrival to the emergency department. The patient was apparently aware of at least one episode in which he remembers his arms being raised above his head that his head turning to the left with the episode apparently lasting 2 minutes. He does not recall any further episodes. EMS reported 2 additional episodes prior to their arrival and then EMS witnessed an additional seizure at which point the patient turned his head to the left and had generalized tonic-clonic activity of the upper extremities. He was subsequently given 5 mg of Versed by EMS which aborted the activiyt. Upon arrival to the emergency department he was postictal with left-sided weakness.  Patient has an extensive psychiatric history. Because of his acute renal failure on chronic kidney disease the lithium he had been on  for many years was discontinued prior to discharge from [place]. He had also recently been started on primidone prior to discharge from [facility]. Patient reported to the admitting physician that "I was concerned of getting epilepsy as one of the side effects from primidone is epilepsy".  Patient reported significant anasarca including lower extremity edema had been ongoing since his surgery at [facility]. This was attributed to his renal function according to the patient.  Assessment/Plan:  New onset seizure -Neurology following - recommend  discontinuing Wellbutrin - increased Depakote dose  -MRI pending -EEG without current seizure activity and otherwise is normal -no further seizures since admit   Bilateral DVT/bilateral LE edema -extensive noting extends bilaterally from PTV to CFV -suspect recent surgery/immobilization contributing/etiologic factors and also has been on chronic testosterone injections every two weeks-? Malignancy but path from recent surgery still pending -continue Lovenox -will consult CM to assist with ?outpatient copay with NOAC, follow  Recent resection liver mass (06/30/13) -at [facility]- path pending in EMR-pt endorses he was given results that were "benign"  OBSTRUCTIVE SLEEP APNEA -Resume hour of sleep CPAP  Bipolar 1 disorder, mixed -Lithium discontinued prior to discharge from [place] -Review of outpatient records reveals patient has had extensive manic episodes when off of this medication therefore we'll ask Psychiatry to weigh in on medication options especially since we are also having to now discontinue Wellbutrin  -pt has requested his psychiatrist review med recs from IP psych MD and make recommendations to IM team and talk with pt if possible- msg left for Dr. Casimiro Needle  CKD (chronic kidney disease), stage III -Stable compared to August 2014 data -had ARF while at [facility] in Jan and required CVVHD -pt still needs to establish w/OP nephro- was previously referred to Dr. Lorrene Reid   Grade 1 diastolic dysfunction -Seen on echo 2014 and currently compensated  MALIGNANT NEOPLASM OF KIDNEY EXCEPT PELVIS (INTIAL DX/SURGERY [place] 2002/2003) -no recurrence as of 2011 but recent resection of liver mass Jan 2015 done due to highly suspicious mass ? new met from [facility] but pt says was told pathology was "benign" -MRI of brain pending to rule out metastatic neoplasm -followed by Signa Kell  Hypothyroidism -Synthroid  Physical deconditioning -Patient prefers not to return to [facility] and  requests bed search for new skilled nursing facility prior to discharge -CIR eval once can dc bedrest  Anemia, chronic disease -Hemoglobin stable  DVT prophylaxis: Full dose Lovenox Code Status: Full Family Communication: Relatives at bedside- returned tod/w pt results of duplex and treatment options/expectations Disposition Plan/Expected LOS: Transfer to floor bed   Consultants: Neurology  Procedures: Bilateral lower extremity venous duplex  Preliminary: Right: DVT noted in the saphenofemoral junction, common femoral, femoral, popliteal, and posterior tibial veins. There appears to be superficial thrombosis in the greater saphenous vein. No Baker's cyst. Left: DVT noted in the common femoral, femoral, popliteal, and posterior tibial veins. No evidence of superficial thrombosis. No Baker's cyst. Technically difficult study due to the patient's body habitus.  Antibiotics: None  HPI/Subjective: Patient alert. No further seizures reported overnight. Denies chest pain shortness of breath  Objective: Blood pressure 98/51, pulse 61, temperature 97.8 F (36.6 C), temperature source Oral, resp. rate 18, height _0  (1.854 m), weight 100.5 kg (221 lb 9 oz), SpO2 100.00%.  Intake/Output Summary (Last 24 hours) at 07/28/13 1903 Last data filed at 07/28/13 1700  Gross per 24 hour  Intake    840 ml  Output  962.5 ml  Net -122.5 ml    Exam: General: No acute respiratory distress Lungs: Clear to auscultation bilaterally without wheezes or crackles, RA Cardiovascular: Regular rate and rhythm without murmur gallop or rub normal S1 and S2, 2-3 + bilateral peripheral edema without  JVD Abdomen: Nontender, nondistended, soft, bowel sounds positive, no rebound, no ascites, no appreciable mass Musculoskeletal: No calf tenderness, trace-+1 edema, Neurological: Alert and oriented x 3, moves all extremities x 4 without focal neurological deficits, CN 2-12 intact  Scheduled Meds:  Scheduled  Meds: . amLODipine  10 mg Oral Daily  . baclofen  10 mg Oral TID  . divalproex  750 mg Oral Q12H  . enoxaparin (LOVENOX) injection  100 mg Subcutaneous Q12H  . levothyroxine  50 mcg Oral QAC breakfast  . pantoprazole  40 mg Oral Daily  . propranolol  20 mg Oral TID  . protein supplement  1 scoop Oral BID WC  . sennosides-docusate sodium  2 tablet Oral BID  . sodium chloride  3 mL Intravenous Q12H  . tamsulosin  0.4 mg Oral QPC breakfast    Data Reviewed: Basic Metabolic Panel:  Recent Labs Lab 07/25/13 2122 07/25/13 2135 07/25/13 2209 07/26/13 0335 07/27/13 0252  NA 142  --  140 141 142  K 5.5*  --  4.8 4.9 4.6  CL 102  --  102 104 104  CO2 22  --   --  25 26  GLUCOSE 140*  --  130* 118* 78  BUN 30*  --  28* 29* 25*  CREATININE 2.54*  --  2.50* 2.28* 2.16*  CALCIUM 8.9  --   --  8.8 8.9  MG  --  3.0*  --   --   --    Liver Function Tests:  Recent Labs Lab 07/25/13 2122 07/26/13 0335  AST 30 27  ALT 32 31  ALKPHOS 99 91  BILITOT <0.2* <0.2*  PROT 6.6 6.2  ALBUMIN 2.7* 2.5*    Recent Labs Lab 07/25/13 2123  AMMONIA 26   CBC:  Recent Labs Lab 07/25/13 2122 07/25/13 2209 07/26/13 0335 07/27/13 0252  WBC 7.6  --  5.1 4.3  NEUTROABS 5.9  --   --   --  HGB 9.6* 11.9* 8.8* 9.1*  HCT 29.8* 35.0* 26.9* 28.7*  MCV 97.4  --  96.8 98.3  PLT 378  --  360 289   CBG:  Recent Labs Lab 07/25/13 2151  GLUCAP 130*    Recent Results (from the past 240 hour(s))  CULTURE, BLOOD (ROUTINE X 2)     Status: None   Collection Time    07/25/13  8:58 PM      Result Value Ref Range Status   Specimen Description BLOOD LEFT HAND   Final   Special Requests BOTTLES DRAWN AEROBIC AND ANAEROBIC 5CC EACH   Final   Culture  Setup Time     Final   Value: 07/26/2013 04:12     Performed at [facility]   Culture     Final   Value:        BLOOD CULTURE RECEIVED NO GROWTH TO DATE CULTURE WILL BE HELD FOR 5 DAYS BEFORE ISSUING A FINAL NEGATIVE REPORT     Performed  at [facility]   Report Status PENDING   Incomplete  CULTURE, BLOOD (ROUTINE X 2)     Status: None   Collection Time    07/25/13  9:12 PM      Result Value Ref Range Status   Specimen Description BLOOD RIGHT ARM   Final   Special Requests BOTTLES DRAWN AEROBIC ONLY 10CC   Final   Culture  Setup Time     Final   Value: 07/26/2013 04:12     Performed at [facility]   Culture     Final   Value:        BLOOD CULTURE RECEIVED NO GROWTH TO DATE CULTURE WILL BE HELD FOR 5 DAYS BEFORE ISSUING A FINAL NEGATIVE REPORT     Performed at [facility]   Report Status PENDING   Incomplete  MRSA PCR SCREENING     Status: None   Collection Time    07/26/13  1:41 AM      Result Value Ref Range Status   MRSA by PCR NEGATIVE  NEGATIVE Final   Comment:            The GeneXpert MRSA Assay (FDA     approved for NASAL specimens     only), is one component of a     comprehensive MRSA colonization     surveillance program. It is not     intended to diagnose MRSA     infection nor to guide or     monitor treatment for     MRSA infections.     Studies:  I have reviewed all recent x-ray that studies in detail.  Time spent : 25 mins     Minette Headland MD [facility] Office  [phone] Pager [phone]   On-Call/Text Page:      [email or web address]      password [email or web address]  If 7PM-7AM, please contact night-coverage [email or web address] Password [facility] 07/28/2013, 7:03 PM   LOS: 3 days    I have examined the patient, reviewed the chart and modified the above note which I agree with.   Minette Headland C,MD [phone] 07/28/2013, 7:03 PM

## 2013-07-28 NOTE — Progress Notes (Signed)
PT Cancellation Note  Patient Details Name: [name] MRN: [id number] DOB: 03/14/1951   Cancelled Treatment:    Reason Eval/Treat Not Completed: Medical issues which prohibited therapy; spoke with RN this am regarding if ok to mobilize.  She will contact MD and let me know if we can begin to mobilize if anticoagulation is appropriate.   WYNN,CYNDI 07/28/2013, 10:26 AM

## 2013-07-28 NOTE — Progress Notes (Signed)
Subjective: No further seizures over night. Tolerating increase in Depakote well.   EEG on 07/26/2013 was normal   Objective: Current vital signs: BP 111/55  Pulse 56  Temp(Src) 98.5 F (36.9 C) (Oral)  Resp 18  Ht 6\' 1"  (1.854 m)  Wt 100.5 kg (221 lb 9 oz)  BMI 29.24 kg/m2  SpO2 100% Vital signs in last 24 hours: Temp:  [97.5 F (36.4 C)-98.7 F (37.1 C)] 98.5 F (36.9 C) (02/11 1000) Pulse Rate:  [56-70] 56 (02/11 1000) Resp:  [14-19] 18 (02/11 1000) BP: (106-127)/(55-65) 111/55 mmHg (02/11 1000) SpO2:  [98 %-100 %] 100 % (02/11 1000) Weight:  [100.5 kg (221 lb 9 oz)] 100.5 kg (221 lb 9 oz) (02/10 2118)  Intake/Output from previous day: 02/10 0701 - 02/11 0700 In: 28 [P.O.:730] Out: 2062.5 [Urine:2050; Drains:12.5] Intake/Output this shift: Total I/O In: 240 [P.O.:240] Out: 350 [Urine:350] Nutritional status: General  Neurologic Exam: Mental Status: Alert, oriented, thought content appropriate.  Speech fluent without evidence of aphasia.  Able to follow 3 step commands without difficulty. Cranial Nerves: II:  Visual fields grossly normal, pupils equal, round, reactive to light and accommodation III,IV, VI: ptosis not present, extra-ocular motions intact bilaterally V,VII: smile symmetric, facial light touch sensation normal bilaterally VIII: hearing normal bilaterally IX,X: gag reflex present XI: bilateral shoulder shrug XII: midline tongue extension without atrophy or fasciculations  Motor: Right : Upper extremity   5/5    Left:     Upper extremity   5/5  Lower extremity   4/5 pain    Lower extremity   4/5 pain Tone and bulk:normal tone throughout; no atrophy noted Sensory: Pinprick and light touch intact throughout, bilaterally Deep Tendon Reflexes:  Right: Upper Extremity   Left: Upper extremity   biceps (C-5 to C-6) 2/4   biceps (C-5 to C-6) 2/4 tricep (C7) 2/4    triceps (C7) 2/4 Brachioradialis (C6) 2/4  Brachioradialis (C6) 2/4  Lower Extremity  Lower Extremity  quadriceps (L-2 to L-4) 2/4   quadriceps (L-2 to L-4) 2/4 Achilles (S1) 1/4   Achilles (S1) 1/4  Plantars: Right: downgoing   Left: downgoing Cerebellar: normal finger-to-nose,    Lab Results: Basic Metabolic Panel:  Recent Labs Lab 07/25/13 2122 07/25/13 2135 07/25/13 2209 07/26/13 0335 07/27/13 0252  NA 142  --  140 141 142  K 5.5*  --  4.8 4.9 4.6  CL 102  --  102 104 104  CO2 22  --   --  25 26  GLUCOSE 140*  --  130* 118* 78  BUN 30*  --  28* 29* 25*  CREATININE 2.54*  --  2.50* 2.28* 2.16*  CALCIUM 8.9  --   --  8.8 8.9  MG  --  3.0*  --   --   --     Liver Function Tests:  Recent Labs Lab 07/25/13 2122 07/26/13 0335  AST 30 27  ALT 32 31  ALKPHOS 99 91  BILITOT <0.2* <0.2*  PROT 6.6 6.2  ALBUMIN 2.7* 2.5*   No results found for this basename: LIPASE, AMYLASE,  in the last 168 hours  Recent Labs Lab 07/25/13 2123  AMMONIA 26    CBC:  Recent Labs Lab 07/25/13 2122 07/25/13 2209 07/26/13 0335 07/27/13 0252  WBC 7.6  --  5.1 4.3  NEUTROABS 5.9  --   --   --   HGB 9.6* 11.9* 8.8* 9.1*  HCT 29.8* 35.0* 26.9* 28.7*  MCV 97.4  --  96.8 98.3  PLT 378  --  360 289    Cardiac Enzymes: No results found for this basename: CKTOTAL, CKMB, CKMBINDEX, TROPONINI,  in the last 168 hours  Lipid Panel: No results found for this basename: CHOL, TRIG, HDL, CHOLHDL, VLDL, LDLCALC,  in the last 168 hours  CBG:  Recent Labs Lab 07/25/13 2151  GLUCAP 130*    Microbiology: Results for orders placed during the hospital encounter of 07/25/13  CULTURE, BLOOD (ROUTINE X 2)     Status: None   Collection Time    07/25/13  8:58 PM      Result Value Ref Range Status   Specimen Description BLOOD LEFT HAND   Final   Special Requests BOTTLES DRAWN AEROBIC AND ANAEROBIC 5CC EACH   Final   Culture  Setup Time     Final   Value: 07/26/2013 04:12     Performed at [facility]   Culture     Final   Value:        BLOOD CULTURE RECEIVED  NO GROWTH TO DATE CULTURE WILL BE HELD FOR 5 DAYS BEFORE ISSUING A FINAL NEGATIVE REPORT     Performed at [facility]   Report Status PENDING   Incomplete  CULTURE, BLOOD (ROUTINE X 2)     Status: None   Collection Time    07/25/13  9:12 PM      Result Value Ref Range Status   Specimen Description BLOOD RIGHT ARM   Final   Special Requests BOTTLES DRAWN AEROBIC ONLY 10CC   Final   Culture  Setup Time     Final   Value: 07/26/2013 04:12     Performed at [facility]   Culture     Final   Value:        BLOOD CULTURE RECEIVED NO GROWTH TO DATE CULTURE WILL BE HELD FOR 5 DAYS BEFORE ISSUING A FINAL NEGATIVE REPORT     Performed at [facility]   Report Status PENDING   Incomplete  MRSA PCR SCREENING     Status: None   Collection Time    07/26/13  1:41 AM      Result Value Ref Range Status   MRSA by PCR NEGATIVE  NEGATIVE Final   Comment:            The GeneXpert MRSA Assay (FDA     approved for NASAL specimens     only), is one component of a     comprehensive MRSA colonization     surveillance program. It is not     intended to diagnose MRSA     infection nor to guide or     monitor treatment for     MRSA infections.    Coagulation Studies: No results found for this basename: LABPROT, INR,  in the last 72 hours  Imaging: Mr Brain Wo Contrast  07/27/2013   CLINICAL DATA:  Seizures. History of renal cell carcinoma, hypertension and hyperlipidemia.  EXAM: MRI HEAD WITHOUT CONTRAST  TECHNIQUE: Multiplanar, multiecho pulse sequences of the brain and surrounding structures were obtained without intravenous contrast.  COMPARISON:  07/25/2013 CT.  No comparison MR.  FINDINGS: No acute infarct.  No intracranial hemorrhage.  No evidence of mesial temporal sclerosis.  No intracranial mass lesion noted on this unenhanced exam.  Global atrophy without hydrocephalus  Major intracranial vascular structures are patent.  Partial empty sella incidentally noted.  Minimal  heterogeneity of bone marrow without bony destructive  lesion. Cervical medullary junction, pineal region and orbital structures unremarkable.  Right parotid 7 mm lesion (series 13 image 16) may be a lymph node but is incompletely assessed.  IMPRESSION: No acute infarct.  No evidence of mesial temporal sclerosis.  No intracranial mass lesion noted on this unenhanced exam.  Global atrophy without hydrocephalus  Right parotid 7 mm lesion (series 13 image 16) may be a lymph node but is incompletely assessed.   Electronically Signed   By: Chauncey Cruel M.D.   On: 07/27/2013 19:21    Medications:  Scheduled: . amLODipine  10 mg Oral Daily  . baclofen  10 mg Oral TID  . divalproex  750 mg Oral Q12H  . enoxaparin (LOVENOX) injection  100 mg Subcutaneous Q12H  . levothyroxine  50 mcg Oral QAC breakfast  . pantoprazole  40 mg Oral Daily  . propranolol  20 mg Oral TID  . protein supplement  1 scoop Oral BID WC  . sennosides-docusate sodium  2 tablet Oral BID  . sodium chloride  3 mL Intravenous Q12H  . tamsulosin  0.4 mg Oral QPC breakfast    Assessment/Plan: Patient without further seizure activity.  MRI of the brain reviewed and shows no evidence of acute changes, no evidence of acute infarct or metastatic disease.  Patient tolerating increase in Depakote.  Previous level subtherapeutic.  Wellbutrin discontinued.    Recommendations: 1.  Remain off Wellbutrin 2.  Continue Depakote at 750mg  BID     LOS: 3 days   Alexis Goodell, MD [facility] [phone]  07/28/2013  12:25 PM

## 2013-07-28 NOTE — Progress Notes (Signed)
PT Cancellation Note  Patient Details Name: [name] MRN: [id number] DOB: Feb 01, 1951   Cancelled Treatment:    Reason Eval/Treat Not Completed: Patient not medically ready; spoke with MD who wishes to keep pt on bedrest at this time.  Will check on pt tomorrow.   Dewain Platz,CYNDI 07/28/2013, 3:47 PM

## 2013-07-28 NOTE — Telephone Encounter (Signed)
Spoke to pt, he is aware we will be in contact with his doctor as to when his clips will need to come out. i explained how he will ned to speak to the social worker concerning where he will be taken for further care after the hospital, and as to obtaining a mobility device and which type of he will need.

## 2013-07-29 ENCOUNTER — Telehealth: Payer: Self-pay

## 2013-07-29 LAB — BASIC METABOLIC PANEL
BUN: 26 mg/dL — AB (ref 6–23)
CHLORIDE: 101 meq/L (ref 96–112)
CO2: 23 meq/L (ref 19–32)
Calcium: 8.6 mg/dL (ref 8.4–10.5)
Creatinine, Ser: 2.03 mg/dL — ABNORMAL HIGH (ref 0.50–1.35)
GFR calc non Af Amer: 33 mL/min — ABNORMAL LOW (ref >90)
GFR, EST AFRICAN AMERICAN: 39 mL/min — AB (ref >90)
Glucose, Bld: 125 mg/dL — ABNORMAL HIGH (ref 70–99)
Potassium: 4.6 mEq/L (ref 3.7–5.3)
Sodium: 138 mEq/L (ref 137–147)

## 2013-07-29 NOTE — Progress Notes (Signed)
OT Cancellation Note  Patient Details Name: [name] MRN: [id number] DOB: [birth year]   Cancelled Treatment:    Reason Eval/Treat Not Completed: Patient not medically ready;Medical issues which prohibited therapy. Patient continues to be on bedrest due to DVTs, please update activity orders when appropriate   Britt Bottom 07/29/2013, 11:52 AM

## 2013-07-29 NOTE — Care Management Note (Signed)
   CARE MANAGEMENT NOTE 07/29/2013  Patient:  [name], [name]   Account Number:  [id number]  Date Initiated:  07/29/2013  Documentation initiated by:  Odell,Arhianna Ebey  Subjective/Objective Assessment:   Pt is resident of [facility]. New onset of seizures, bil lower extremity DVT.     Action/Plan:   Plan to return to SNF.   Anticipated DC Date:     Anticipated DC Plan:  SKILLED NURSING FACILITY         Choice offered to / List presented to:             Status of service:  In process, will continue to follow Medicare Important Message given?   (If response is "NO", the following Medicare IM given date fields will be blank) Date Medicare IM given:   Date Additional Medicare IM given:    Discharge Disposition:  SKILLED NURSING FACILITY  Per UR Regulation:    If discussed at Long Length of Stay Meetings, dates discussed:    Comments:

## 2013-07-29 NOTE — Clinical Social Work Note (Signed)
Per patient's request, CSW faxed out his information to all SNF's in [place] and later today gave him bed offers. Patient's mother was present during visit. Patient is hopeful that he will go to Inpatient rehab, but understands that the SNF search is needed if he is not accepted by CIR. CSW reviewed responding facilities with patient, provided him with SNF list with responses and answered his questions. CSW also talked with patient about  the need for PT/OT evaluations for insurance approval of inpatient rehab or SNF placement for short-term rehab. CSW will continue to monitor patient progress and assist with d/c to a skilled nursing facility for short-term rehab if patient not approved by CIR.  [name], MSW, LCSW [phone]

## 2013-07-29 NOTE — Consult Note (Signed)
WOC wound consult note Reason for Consult:Abdominal incision Wound type:Surgical  Pressure Ulcer POA: No Measurement:"L" shaped, 90% closely approximated incision with vertical axis at midline, measuring 6cm (with 13 staples) and horizontal axis on right side measuring 28cm (with 40 staples). There is an open area on the right lateral end of the incision (2cm from the end) measuring 0.5cm x 2cm x 4cm.  There is a 1/4 inch plan gauze packing strip filling this defect which is removed and which is saturated with thick yellow exudate, and which has a faint odor.  There is periwound erythema in this are measuring 3cm x 6cm. Wound EHO:[id number] to visualize into defect. Drainage (amount, consistency, odor) As described above Periwound:As described above. Dressing procedure/placement/frequency: Patient had an appointment at [facility] for tomorrow to have staples removed from his incision. As the wound is closely approximated and the tissue beneath reepithelialized, I feel it is safe to remove those staples, but will defer to the physician for that order.  If you agree, please order nursing staff to remove staples.  I will provide orders for the cleansing and filling of the defect mentioned above recommending twice daily saline cleanse and filling of the defect with 1/4 inch plain gauze packing strip until this area contracts and fills to (near) skin level. [facility] nursing team will not follow, but will remain available to this patient, the nursing and medical teams.  Please re-consult if needed. Thanks, Maudie Flakes, MSN, RN, [place], [place], [place] [phone])

## 2013-07-29 NOTE — Progress Notes (Signed)
   Progress Note  [name] MRN:[id number] DOB: 01/27/1951 DOA: 07/25/2013 PCP: DAUB, STEVE A, MD  Brief narrative: 62 male patient with multiple medical problems including bipolar disorder, renal cell carcinoma status post chemotherapy and nephrectomy, hypertension, and recent tumor excision for mass on the liver. The latter procedure had been performed at [facility] and patient was significantly deconditioned after this procedure so was discharged to [facility] skilled nursing facility on 07/23/2013.  Patient apparently had 3 seizures at his SNF prior to arrival to the emergency department. The patient was apparently aware of at least one episode in which he remembers his arms being raised above his head that his head turning to the left with the episode apparently lasting 2 minutes. He does not recall any further episodes. EMS reported 2 additional episodes prior to their arrival and then EMS witnessed an additional seizure at which point the patient turned his head to the left and had generalized tonic-clonic activity of the upper extremities. He was subsequently given 5 mg of Versed by EMS which aborted the activiyt. Upon arrival to the emergency department he was postictal with left-sided weakness.  Patient has an extensive psychiatric history. Because of his acute renal failure on chronic kidney disease the lithium he had been on  for many years was discontinued prior to discharge from [facility]. He had also recently been started on primidone prior to discharge from [facility]. Patient reported to the admitting physician that "I was concerned of getting epilepsy as one of the side effects from primidone is epilepsy".  Patient reported significant anasarca including lower extremity edema had been ongoing since his surgery at [facility]. This was attributed to his renal function according to the patient.  Assessment/Plan:  New onset seizure -Neurology following - recommend discontinuing Wellbutrin - increased  Depakote dose  -MRI with no acute infarct -EEG without current seizure activity and otherwise is normal -no further seizures since admit   Bilateral DVT/bilateral LE edema -extensive noting extends bilaterally from PTV to CFV -suspect recent surgery/immobilization contributing/etiologic factors and also has been on chronic testosterone injections every two weeks-? Malignancy but path from recent surgery still pending  -Appreciate CM assistance with NOAC copay>> discussed options at length with patient and family, patient  wants to talk to his PCP prior to making decision on Coumadin versus NOAC, given his CKD Coumadin would likely be better>> follow up in a.m. -continue Lovenox Recent resection liver mass (06/30/13) -at [facility]- path pending in EMR-pt endorses he was given results that were "benign"  OBSTRUCTIVE SLEEP APNEA -Resume hour of sleep CPAP  Bipolar 1 disorder, mixed -Lithium discontinued prior to discharge from [facility] -Review of outpatient records reveals patient has had extensive manic episodes when off of this medication therefore we'll ask Psychiatry to weigh in on medication options especially since we are also having to now discontinue Wellbutrin  -pt has requested his psychiatrist review med recs from IP psych MD and make recommendations to IM team and talk with pt if possible- msg left for was Dr. Plovsky>> no callback at this time from Dr Plovsky, follow  CKD (chronic kidney disease), stage III -Stable compared to August 2014 data -had ARF while at [facility] in Jan and required CVVHD -cr gradually trending down, 2.03 today, follow. -pt still needs to establish w/OP nephro- was previously referred to Dr. Dunham   Grade 1 diastolic dysfunction -Seen on echo 2014 and currently compensated  MALIGNANT NEOPLASM OF KIDNEY EXCEPT PELVIS (INTIAL DX/SURGERY 1998 WITH RECURRENCE 2002/2003) -no   recurrence as of 2011 but recent resection of liver mass Jan 2015 done due to highly suspicious  mass ? new met from [facility] but pt says was told pathology was "benign" -MRI of brain pending to rule out metastatic neoplasm -followed by [facility]  Hypothyroidism -Synthroid  Physical deconditioning -Patient prefers not to return to [facility] and requests bed search for new skilled nursing facility prior to discharge -PTU reconsulted to eval -Await CIR eval  Anemia, chronic disease -Hemoglobin stable  DVT prophylaxis: Full dose Lovenox Code Status: Full Family Communication: Emily at bedside Disposition Plan/Expected LOS: Pending PT eval  Consultants: Neurology  Procedures: Bilateral lower extremity venous duplex  Preliminary: Right: DVT noted in the saphenofemoral junction, common femoral, femoral, popliteal, and posterior tibial veins. There appears to be superficial thrombosis in the greater saphenous vein. No Baker's cyst. Left: DVT noted in the common femoral, femoral, popliteal, and posterior tibial veins. No evidence of superficial thrombosis. No Baker's cyst. Technically difficult study due to the patient's body habitus.  Antibiotics: None  HPI/Subjective: Denies chest pain shortness of breath, no seizures reported overnight.  Objective: Blood pressure 110/46, pulse 51, temperature 97.8 F (36.6 C), temperature source Oral, resp. rate 18, height 6' 1" (1.854 m), weight 101.243 kg (223 lb 3.2 oz), SpO2 100.00%.  Intake/Output Summary (Last 24 hours) at 07/29/13 6578 Last data filed at 07/29/13 1300  Gross per 24 hour  Intake    600 ml  Output    635 ml  Net    -35 ml    Exam: General: No acute respiratory distress Lungs: Clear to auscultation bilaterally without wheezes or crackles, RA Cardiovascular: Regular rate and rhythm without murmur gallop or rub normal S1 and S2, +1 edema without  JVD Abdomen: Nontender, nondistended, soft, bowel sounds positive, no rebound, no ascites, no appreciable mass Extremities: No calf tenderness, +1 edema, Neurological:  Alert and oriented x 3, moves all extremities x 4 without focal neurological deficits, CN 2-12 intact  Scheduled Meds:  Scheduled Meds: . amLODipine  10 mg Oral Daily  . baclofen  10 mg Oral TID  . divalproex  750 mg Oral Q12H  . enoxaparin (LOVENOX) injection  100 mg Subcutaneous Q12H  . levothyroxine  50 mcg Oral QAC breakfast  . pantoprazole  40 mg Oral Daily  . propranolol  20 mg Oral TID  . protein supplement  1 scoop Oral BID WC  . sennosides-docusate sodium  2 tablet Oral BID  . sodium chloride  3 mL Intravenous Q12H  . tamsulosin  0.4 mg Oral QPC breakfast    Data Reviewed: Basic Metabolic Panel:  Recent Labs Lab 07/25/13 2122 07/25/13 2135 07/25/13 2209 07/26/13 0335 07/27/13 0252 07/29/13 1640  NA 142  --  140 141 142 138  K 5.5*  --  4.8 4.9 4.6 4.6  CL 102  --  102 104 104 101  CO2 22  --   --  _0 GLUCOSE 140*  --  130* 118* 78 125*  BUN 30*  --  28* 29* 25* 26*  CREATININE 2.54*  --  2.50* 2.28* 2.16* 2.03*  CALCIUM 8.9  --   --  8.8 8.9 8.6  MG  --  3.0*  --   --   --   --    Liver Function Tests:  Recent Labs Lab 07/25/13 2122 07/26/13 0335  AST 30 27  ALT 32 31  ALKPHOS 99 91  BILITOT <0.2* <0.2*  PROT 6.6 6.2  ALBUMIN 2.7* 2.5*    Recent Labs Lab 07/25/13 2123  AMMONIA 26   CBC:  Recent Labs Lab 07/25/13 2122 07/25/13 2209 07/26/13 0335 07/27/13 0252  WBC 7.6  --  5.1 4.3  NEUTROABS 5.9  --   --   --   HGB 9.6* 11.9* 8.8* 9.1*  HCT 29.8* 35.0* 26.9* 28.7*  MCV 97.4  --  96.8 98.3  PLT 378  --  360 289   CBG:  Recent Labs Lab 07/25/13 2151  GLUCAP 130*    Recent Results (from the past 240 hour(s))  CULTURE, BLOOD (ROUTINE X 2)     Status: None   Collection Time    07/25/13  8:58 PM      Result Value Ref Range Status   Specimen Description BLOOD LEFT HAND   Final   Special Requests BOTTLES DRAWN AEROBIC AND ANAEROBIC 5CC EACH   Final   Culture  Setup Time     Final   Value: 07/26/2013 04:12     Performed at  [facility]   Culture     Final   Value:        BLOOD CULTURE RECEIVED NO GROWTH TO DATE CULTURE WILL BE HELD FOR 5 DAYS BEFORE ISSUING A FINAL NEGATIVE REPORT     Performed at [facility]   Report Status PENDING   Incomplete  CULTURE, BLOOD (ROUTINE X 2)     Status: None   Collection Time    07/25/13  9:12 PM      Result Value Ref Range Status   Specimen Description BLOOD RIGHT ARM   Final   Special Requests BOTTLES DRAWN AEROBIC ONLY 10CC   Final   Culture  Setup Time     Final   Value: 07/26/2013 04:12     Performed at [facility]   Culture     Final   Value:        BLOOD CULTURE RECEIVED NO GROWTH TO DATE CULTURE WILL BE HELD FOR 5 DAYS BEFORE ISSUING A FINAL NEGATIVE REPORT     Performed at [facility]   Report Status PENDING   Incomplete  MRSA PCR SCREENING     Status: None   Collection Time    07/26/13  1:41 AM      Result Value Ref Range Status   MRSA by PCR NEGATIVE  NEGATIVE Final   Comment:            The GeneXpert MRSA Assay (FDA     approved for NASAL specimens     only), is one component of a     comprehensive MRSA colonization     surveillance program. It is not     intended to diagnose MRSA     infection nor to guide or     monitor treatment for     MRSA infections.     Studies:  I have reviewed all recent x-ray that studies in detail.  Time spent : 35 mins     Minette Headland MD [facility] Office  [phone] Pager [phone]   On-Call/Text Page:      [email or web address]      password [email or web address]  If 7PM-7AM, please contact night-coverage [email or web address] Password [email or web address] 07/29/2013, 6:52 PM   LOS: 4 days    I have examined the patient, reviewed the chart and modified the above note which I agree with.   Minette Headland C,MD [phone] 07/29/2013, 6:52 PM

## 2013-07-29 NOTE — Progress Notes (Signed)
PT Cancellation Note  Patient Details Name: [name] MRN: [id number] DOB: 1951/02/19   Cancelled Treatment:    Reason Eval/Treat Not Completed: Patient not medically ready; patient remains on bedrest due to DVT's; please update activity orders when safe to mobilize. Thanks.   [name],CYNDI 07/29/2013, 11:17 AM

## 2013-07-29 NOTE — Telephone Encounter (Signed)
Patient has some things he wants to talk about with Dr. Everlene Farrier. Could not understand patient very well over the phone.  [phone]

## 2013-07-29 NOTE — Care Management Note (Signed)
Per benefit check with pt insurance, the pt copay for Xarelto , Eliquis and Paradaxa would be $50 per month however Pradaxa would have to be ordered from [facility]. The pt would have a $100 copay for a 90 day supply or any of these medications if ordered from [facility]. Ordering would require several days to receive delivery. As the pt has insurance he is not eligible for assistance from the hospital.  Jasmine Pang RN MPH, case manager, [phone]

## 2013-07-30 DIAGNOSIS — R609 Edema, unspecified: Secondary | ICD-10-CM

## 2013-07-30 DIAGNOSIS — F3110 Bipolar disorder, current episode manic without psychotic features, unspecified: Secondary | ICD-10-CM

## 2013-07-30 LAB — PROTIME-INR
INR: 1.05 (ref 0.00–1.49)
Prothrombin Time: 13.5 seconds (ref 11.6–15.2)

## 2013-07-30 LAB — CBC
HEMATOCRIT: 29.9 % — AB (ref 39.0–52.0)
HEMOGLOBIN: 9.6 g/dL — AB (ref 13.0–17.0)
MCH: 31.6 pg (ref 26.0–34.0)
MCHC: 32.1 g/dL (ref 30.0–36.0)
MCV: 98.4 fL (ref 78.0–100.0)
Platelets: 297 10*3/uL (ref 150–400)
RBC: 3.04 MIL/uL — ABNORMAL LOW (ref 4.22–5.81)
RDW: 15.3 % (ref 11.5–15.5)
WBC: 4.4 10*3/uL (ref 4.0–10.5)

## 2013-07-30 LAB — BASIC METABOLIC PANEL
BUN: 25 mg/dL — AB (ref 6–23)
CO2: 27 mEq/L (ref 19–32)
Calcium: 8.9 mg/dL (ref 8.4–10.5)
Chloride: 102 mEq/L (ref 96–112)
Creatinine, Ser: 2.06 mg/dL — ABNORMAL HIGH (ref 0.50–1.35)
GFR calc Af Amer: 38 mL/min — ABNORMAL LOW (ref >90)
GFR, EST NON AFRICAN AMERICAN: 33 mL/min — AB (ref >90)
GLUCOSE: 100 mg/dL — AB (ref 70–99)
Potassium: 4.9 mEq/L (ref 3.7–5.3)
Sodium: 140 mEq/L (ref 137–147)

## 2013-07-30 MED ORDER — WARFARIN SODIUM 7.5 MG PO TABS
7.5000 mg | ORAL_TABLET | Freq: Once | ORAL | Status: AC
  Administered 2013-07-30: 7.5 mg via ORAL
  Filled 2013-07-30: qty 1

## 2013-07-30 MED ORDER — COUMADIN BOOK
Freq: Once | Status: AC
  Administered 2013-07-30: 17:00:00
  Filled 2013-07-30: qty 1

## 2013-07-30 MED ORDER — WARFARIN - PHARMACIST DOSING INPATIENT
Freq: Every day | Status: DC
  Administered 2013-07-31 – 2013-08-02 (×4)

## 2013-07-30 MED ORDER — WARFARIN VIDEO
Freq: Once | Status: AC
  Administered 2013-07-31: 11:00:00

## 2013-07-30 NOTE — Progress Notes (Signed)
Rehab Admissions Coordinator Note:  Patient was screened by Retta Diones for appropriateness for an Inpatient Acute Rehab Consult.  At this time, we are recommending [facility]. I did call [employer] and discuss with the case manager.  Given current diagnosis, we will not be able to get authorization for acute inpatient rehab admission.  Therefore, recommend SNF.  Retta Diones 07/30/2013, 1:36 PM  I can be reached at [phone].

## 2013-07-30 NOTE — Progress Notes (Signed)
Progress Note  [name] Milliman NOI:[id number] DOB: Mar 07, 1951 DOA: 07/25/2013 PCP: [name], MD  Brief narrative: 58 male patient with multiple medical problems including bipolar disorder, renal cell carcinoma status post chemotherapy and nephrectomy, hypertension, and recent tumor excision for mass on the liver. The latter procedure had been performed at [facility] and patient was significantly deconditioned after this procedure so was discharged to [facility] skilled nursing facility on 07/23/2013.  Patient apparently had 3 seizures at his SNF prior to arrival to the emergency department. The patient was apparently aware of at least one episode in which he remembers his arms being raised above his head that his head turning to the left with the episode apparently lasting 2 minutes. He does not recall any further episodes. EMS reported 2 additional episodes prior to their arrival and then EMS witnessed an additional seizure at which point the patient turned his head to the left and had generalized tonic-clonic activity of the upper extremities. He was subsequently given 5 mg of Versed by EMS which aborted the activiyt. Upon arrival to the emergency department he was postictal with left-sided weakness.  Patient has an extensive psychiatric history. Because of his acute renal failure on chronic kidney disease the lithium he had been on  for many years was discontinued prior to discharge from [place]. He had also recently been started on primidone prior to discharge from [facility]. Patient reported to the admitting physician that "I was concerned of getting epilepsy as one of the side effects from primidone is epilepsy".  Patient reported significant anasarca including lower extremity edema had been ongoing since his surgery at [facility]. This was attributed to his renal function according to the patient.  Assessment/Plan:  New onset seizure -Neurology following - recommend discontinuing Wellbutrin - increased  Depakote dose  -MRI with no acute infarct -EEG without current seizure activity and otherwise is normal -no further seizures since admit   Bilateral DVT/bilateral LE edema -extensive noting extends bilaterally from PTV to CFV -suspect recent surgery/immobilization contributing/etiologic factors and also has been on chronic testosterone injections every two weeks-? Malignancy but path from recent surgery still pending -coumadin -continue Lovenox  Recent resection liver mass (06/30/13) -at [facility]- path pending in EMR-pt endorses he was given results that were "benign"  OBSTRUCTIVE SLEEP APNEA -Resume hour of sleep CPAP  Bipolar 1 disorder, mixed -Lithium discontinued prior to discharge from [place] -Review of outpatient records reveals patient has had extensive manic episodes when off of this medication therefore we'll ask Psychiatry to weigh in on medication options especially since we are also having to now discontinue Wellbutrin  - no callback at this time from Dr Casimiro Needle, will consult Dr. Hillard Danker   CKD (chronic kidney disease), stage III -Stable compared to August 2014 data -had ARF while at [facility] in Jan and required CVVHD -cr gradually trending down, 2.03 today, follow. -pt still needs to establish w/OP nephro- was previously referred to Dr. Lorrene Reid   Grade 1 diastolic dysfunction -Seen on echo 2014 and currently compensated  MALIGNANT NEOPLASM OF KIDNEY EXCEPT PELVIS (INTIAL DX/SURGERY [place] 2002/2003) -no recurrence as of 2011 but recent resection of liver mass Jan 2015 done due to highly suspicious mass ? new met from [facility] but pt says was told pathology was "benign" -MRI of brain pending to rule out metastatic neoplasm -followed by [facility]  Hypothyroidism -Synthroid  Physical deconditioning -Patient prefers not to return to [facility] and requests bed search for new skilled nursing facility  prior to discharge -PT reconsulted to eval -Await CIR  eval  Anemia, chronic disease -Hemoglobin stable  DVT prophylaxis: Full dose Lovenox Code Status: Full Family Communication: patient Disposition Plan/Expected LOS: Pending PT eval  Consultants: Neurology  Procedures: Bilateral lower extremity venous duplex  Preliminary: Right: DVT noted in the saphenofemoral junction, common femoral, femoral, popliteal, and posterior tibial veins. There appears to be superficial thrombosis in the greater saphenous vein. No Baker's cyst. Left: DVT noted in the common femoral, femoral, popliteal, and posterior tibial veins. No evidence of superficial thrombosis. No Baker's cyst. Technically difficult study due to the patient's body habitus.  Antibiotics: None  HPI/Subjective: Getting up with PT, no SOB, no CP  Objective: Blood pressure 143/75, pulse 57, temperature 98.3 F (36.8 C), temperature source Oral, resp. rate 18, height 6' 1" (1.854 m), weight 101.243 kg (223 lb 3.2 oz), SpO2 98.00%.  Intake/Output Summary (Last 24 hours) at 07/30/13 1027 Last data filed at 07/29/13 1300  Gross per 24 hour  Intake    240 ml  Output      0 ml  Net    240 ml    Exam: General: No acute respiratory distress Lungs: Clear to auscultation bilaterally without wheezes or crackles, RA Cardiovascular: Regular rate and rhythm without murmur gallop or rub normal S1 and S2, +1 edema without  JVD Abdomen: Nontender, nondistended, soft, bowel sounds positive, no rebound, no ascites, no appreciable mass Extremities: No calf tenderness, +1 edema, Neurological: Alert and oriented x 3, moves all extremities x 4 without focal neurological deficits, CN 2-12 intact  Scheduled Meds:  Scheduled Meds: . amLODipine  10 mg Oral Daily  . baclofen  10 mg Oral TID  . divalproex  750 mg Oral Q12H  . enoxaparin (LOVENOX) injection  100 mg Subcutaneous Q12H  . levothyroxine  50 mcg Oral QAC breakfast  . pantoprazole  40 mg Oral Daily  . propranolol  20 mg Oral TID  . protein  supplement  1 scoop Oral BID WC  . sennosides-docusate sodium  2 tablet Oral BID  . sodium chloride  3 mL Intravenous Q12H  . tamsulosin  0.4 mg Oral QPC breakfast    Data Reviewed: Basic Metabolic Panel:  Recent Labs Lab 07/25/13 2122 07/25/13 2135 07/25/13 2209 07/26/13 0335 07/27/13 0252 07/29/13 1640 07/30/13 0601  NA 142  --  140 141 142 138 140  K 5.5*  --  4.8 4.9 4.6 4.6 4.9  CL 102  --  102 104 104 101 102  CO2 22  --   --  _0 GLUCOSE 140*  --  130* 118* 78 125* 100*  BUN 30*  --  28* 29* 25* 26* 25*  CREATININE 2.54*  --  2.50* 2.28* 2.16* 2.03* 2.06*  CALCIUM 8.9  --   --  8.8 8.9 8.6 8.9  MG  --  3.0*  --   --   --   --   --    Liver Function Tests:  Recent Labs Lab 07/25/13 2122 07/26/13 0335  AST 30 27  ALT 32 31  ALKPHOS 99 91  BILITOT <0.2* <0.2*  PROT 6.6 6.2  ALBUMIN 2.7* 2.5*    Recent Labs Lab 07/25/13 2123  AMMONIA 26   CBC:  Recent Labs Lab 07/25/13 2122 07/25/13 2209 07/26/13 0335 07/27/13 0252 07/30/13 0601  WBC 7.6  --  5.1 4.3 4.4  NEUTROABS 5.9  --   --   --   --  HGB 9.6* 11.9* 8.8* 9.1* 9.6*  HCT 29.8* 35.0* 26.9* 28.7* 29.9*  MCV 97.4  --  96.8 98.3 98.4  PLT 378  --  360 289 297   CBG:  Recent Labs Lab 07/25/13 2151  GLUCAP 130*    Recent Results (from the past 240 hour(s))  CULTURE, BLOOD (ROUTINE X 2)     Status: None   Collection Time    07/25/13  8:58 PM      Result Value Ref Range Status   Specimen Description BLOOD LEFT HAND   Final   Special Requests BOTTLES DRAWN AEROBIC AND ANAEROBIC 5CC EACH   Final   Culture  Setup Time     Final   Value: 07/26/2013 04:12     Performed at [facility]   Culture     Final   Value:        BLOOD CULTURE RECEIVED NO GROWTH TO DATE CULTURE WILL BE HELD FOR 5 DAYS BEFORE ISSUING A FINAL NEGATIVE REPORT     Performed at [facility]   Report Status PENDING   Incomplete  CULTURE, BLOOD (ROUTINE X 2)     Status: None   Collection Time     07/25/13  9:12 PM      Result Value Ref Range Status   Specimen Description BLOOD RIGHT ARM   Final   Special Requests BOTTLES DRAWN AEROBIC ONLY 10CC   Final   Culture  Setup Time     Final   Value: 07/26/2013 04:12     Performed at [facility]   Culture     Final   Value:        BLOOD CULTURE RECEIVED NO GROWTH TO DATE CULTURE WILL BE HELD FOR 5 DAYS BEFORE ISSUING A FINAL NEGATIVE REPORT     Performed at [facility]   Report Status PENDING   Incomplete  MRSA PCR SCREENING     Status: None   Collection Time    07/26/13  1:41 AM      Result Value Ref Range Status   MRSA by PCR NEGATIVE  NEGATIVE Final   Comment:            The GeneXpert MRSA Assay (FDA     approved for NASAL specimens     only), is one component of a     comprehensive MRSA colonization     surveillance program. It is not     intended to diagnose MRSA     infection nor to guide or     monitor treatment for     MRSA infections.       Time spent : 35 mins      DO [facility] Office  [phone] Pager [phone]   On-Call/Text Page:      [email or web address]      password [email or web address]  If 7PM-7AM, please contact night-coverage [email or web address] Password [email or web address] 07/30/2013, 10:27 AM   LOS: 5 days   

## 2013-07-30 NOTE — Progress Notes (Signed)
Occupational Therapy Evaluation Patient Details Name: [name] MRN: [id number] DOB: July 16, 1950 Today's Date: 07/30/2013 Time: 2423-5361 OT Time Calculation (min): 14 min  OT Assessment / Plan / Recommendation History of present illness 57 male patient with multiple medical problems including bipolar disorder, renal cell carcinoma status post chemotherapy and nephrectomy, hypertension, and recent tumor excision for mass on the liver. The latter procedure had been performed at [facility] and patient was significantly deconditioned after this procedure so was discharged to [facility] skilled nursing facility on 07/23/2013.  Patient apparently had 3 seizures at his SNF prior to arrival to the emergency department.  Upon arrival to the emergency department he was postictal with left-sided weakness.   Clinical Impression   Patient requires A with ADLs and mobility at this time. Recommend SNF prior to discharge home.    OT Assessment  Patient needs continued OT Services    Follow Up Recommendations  SNF    Barriers to Discharge      Equipment Recommendations  Other (comment) (tbd at SNF)    Recommendations for Other Services    Frequency  Min 2X/week    Precautions / Restrictions Precautions Precautions: Fall   Pertinent Vitals/Pain No c/o pain    ADL  Eating/Feeding: Performed;Independent Where Assessed - Eating/Feeding: Chair Grooming: Performed;Wash/dry hands;Set up Where Assessed - Grooming: Supported sitting Lower Body Bathing: Performed;+1 Total assistance Where Assessed - Lower Body Bathing: Supported sitting;Supported standing Lower Body Dressing: Simulated;+1 Total assistance Where Assessed - Lower Body Dressing: Supported sitting Toilet Transfer: Simulated;Minimal Print production planner Method: Sit to stand;Stand pivot Science writer: Bedside commode Equipment Used: Rolling walker;Gait belt Transfers/Ambulation Related to ADLs: Pt up in recliner; requested  to get back to bed. Min A sit to stand and pivot to bed using RW. Min A scoot up in bed with use of bedrails. I roll L/R in bed. ADL Comments: Patient required total A for peri hygiene, donning socks    OT Diagnosis: Generalized weakness  OT Problem List: Decreased strength;Decreased activity tolerance;Decreased safety awareness;Decreased knowledge of use of DME or AE OT Treatment Interventions: Self-care/ADL training;Therapeutic exercise;DME and/or AE instruction;Therapeutic activities;Patient/family education   OT Goals(Current goals can be found in the care plan section) Acute Rehab OT Goals Patient Stated Goal: rehab prior to d/c OT Goal Formulation: With patient Time For Goal Achievement: 07/30/13 Potential to Achieve Goals: Good  Visit Information  Last OT Received On: 07/30/13 Assistance Needed: +1 History of Present Illness: 63 male patient with multiple medical problems including bipolar disorder, renal cell carcinoma status post chemotherapy and nephrectomy, hypertension, and recent tumor excision for mass on the liver. The latter procedure had been performed at [facility] and patient was significantly deconditioned after this procedure so was discharged to [facility] skilled nursing facility on 07/23/2013.  Patient apparently had 3 seizures at his SNF prior to arrival to the emergency department.  Upon arrival to the emergency department he was postictal with left-sided weakness.       Prior [place] expects to be discharged to:: Private residence Living Arrangements: Spouse/significant other;Parent Available Help at Discharge: Family;Available 24 hours/day Type of Home: House Home Access: Stairs to enter [employer] of Steps: 3 Entrance Stairs-Rails: None Home Layout: Two level;Bed/bath upstairs;1/2 bath on main level Alternate Level Stairs-Number of Steps: flight Alternate Level Stairs-Rails: Right Home Equipment: Cane - single  point Additional Comments: pt recently at SNF for rehab Prior Function Level of Independence: Independent Comments: Was independent prior to  hospitalization at [facility] for liver tumor excision, but was there about 3 weeks and d/c to [facility] on 07/23/13, admitted here 07/25/13  Communication Communication: No difficulties Dominant Hand: Right         Vision/Perception     Cognition  Cognition Arousal/Alertness: Awake/alert Behavior During Therapy: WFL for tasks assessed/performed Overall Cognitive Status: Within Functional Limits for tasks assessed    Extremity/Trunk Assessment Upper Extremity Assessment Upper Extremity Assessment: Overall WFL for tasks assessed Lower Extremity Assessment Lower Extremity Assessment: Defer to PT evaluation     End of Session OT - End of Session Equipment Utilized During Treatment: Gait belt;Rolling walker Activity Tolerance: Patient tolerated treatment well Patient left: in bed;with call bell/phone within reach  GO     [name] A 07/30/2013, 4:05 PM

## 2013-07-30 NOTE — Telephone Encounter (Signed)
Patient is very sick and is in hospital, message to Dr Everlene Farrier, he will call patient.

## 2013-07-30 NOTE — Progress Notes (Addendum)
ANTICOAGULATION CONSULT NOTE - Follow Up Consult  Pharmacy Consult for lovenox Indication: DVT  No Known Allergies  Patient Measurements: Height: 6\' 1"  (185.4 cm) Weight: 223 lb 3.2 oz (101.243 kg) IBW/kg (Calculated) : 79.9  Vital Signs: Temp: 98.3 F (36.8 C) (02/13 0437) Temp src: Oral (02/13 0437) BP: 114/55 mmHg (02/13 0437) Pulse Rate: 57 (02/13 0437)  Labs:  Recent Labs  07/29/13 1640 07/30/13 0601  HGB  --  9.6*  HCT  --  29.9*  PLT  --  297  CREATININE 2.03* 2.06*    Estimated Creatinine Clearance: 46.5 ml/min (by C-G formula based on Cr of 2.06).  Assessment: 33 yom continues on therapeutic lovenox for treatment of new bilateral DVT. H/H is low but stable, plts are WNL. No bleeding noted. Known history of CKD with SCr at 2.06 with CrCL ~73ml/min. Oral anticoagulation has not yet been initiated. If coumadin initiated, will require at least 5 days of overlap therapy with lovenox.   Goal of Therapy:  Anti-Xa level 0.6-1 units/ml 4hrs after LMWH dose given Monitor platelets by anticoagulation protocol: Yes   Plan:  1. Continue lovenox 100mg  SQ Q12H 2. F/u CBC Q72H, renal fxn and S&S of bleeding 3. F/u start of oral anticoagulation  Luberta Grabinski, Rande Lawman 07/30/2013,9:35 AM  Addendum: Now starting warfarin. No baseline INR available. Will require 5 days of overlap with lovenox.  Plan: 1. INR now and daily 2. Warfarin 7.5mg  PO x 1 tonight if INR is WNL 3. Coumadin book and video to patient  Salome Arnt, PharmD, BCPS Pager # [phone] 07/30/2013 10:39 AM

## 2013-07-30 NOTE — Evaluation (Addendum)
Physical Therapy Evaluation Patient Details Name: HALL BIRCHARD MRN: [id number] DOB: June 26, 1950 Today's Date: 07/30/2013 Time: 1000-1100 PT Time Calculation (min): 60 min  PT Assessment / Plan / Recommendation History of Present Illness  62 male patient with multiple medical problems including bipolar disorder, renal cell carcinoma status post chemotherapy and nephrectomy, hypertension, and recent tumor excision for mass on the liver. The latter procedure had been performed at [facility] and patient was significantly deconditioned after this procedure so was discharged to [facility] skilled nursing facility on 07/23/2013.  Patient apparently had 3 seizures at his SNF prior to arrival to the emergency department.  Upon arrival to the emergency department he was postictal with left-sided weakness.  Clinical Impression  Patient presents with decreased independence with mobility due to deficits listed below and will benefit from skilled PT in the acute setting to allow return home with family assist.  Feel dizziness may be related to seizures and immobility over past few days due to bedrest.  Pt feels edema is better and hopeful for CIR stay to allow return to independent.  Of note, wife works, but pt plans to go to [facility] initially until can stay independent while wife at work.   PT Assessment  Patient needs continued PT services    Follow Up Recommendations  CIR    Does the patient have the potential to tolerate intense rehabilitation    Yes  Barriers to Discharge  None      Equipment Recommendations  Rolling walker with 5" wheels    Recommendations for Other Services Rehab consult   Frequency Min 3X/week    Precautions / Restrictions Precautions Precautions: Fall   Pertinent Vitals/Pain Min c/o incisional pain with supine to sit      Mobility  Bed Mobility Overal bed mobility: Needs Assistance Bed Mobility: Supine to Sit Supine to sit: Mod assist General bed mobility  comments: assist to guide feet off bed and to lift trunk Transfers Overall transfer level: Needs assistance Equipment used: Rolling walker (2 wheeled) Transfers: Sit to/from [facility] Sit to Stand: Min assist;From elevated surface Stand pivot transfers: Min assist General transfer comment: stood from bed at elevated height, turned with walker to chair only due to c/o significant dizziness in standing    Exercises     PT Diagnosis: Generalized weakness;Difficulty walking  PT Problem List: Decreased strength;Decreased activity tolerance;Decreased balance;Decreased mobility;Decreased knowledge of use of DME PT Treatment Interventions: DME instruction;Balance training;Gait training;Stair training;Functional mobility training;Patient/family education;Therapeutic activities;Therapeutic exercise     PT Goals(Current goals can be found in the care plan section) Acute Rehab PT Goals Patient Stated Goal: To go to rehab at [facility] prior to d/c home PT Goal Formulation: With patient Time For Goal Achievement: 08/13/13 Potential to Achieve Goals: Good  Visit Information  Last PT Received On: 07/30/13 Assistance Needed: +1 History of Present Illness: 45 male patient with multiple medical problems including bipolar disorder, renal cell carcinoma status post chemotherapy and nephrectomy, hypertension, and recent tumor excision for mass on the liver. The latter procedure had been performed at [facility] and patient was significantly deconditioned after this procedure so was discharged to [facility] skilled nursing facility on 07/23/2013.  Patient apparently had 3 seizures at his SNF prior to arrival to the emergency department.  Upon arrival to the emergency department he was postictal with left-sided weakness.       Prior [place] expects to be discharged to:: Private residence Living Arrangements: Spouse/significant other;Parent Available Help at  Discharge:  Family;Available 24 hours/day Type of Home: House Home Access: Stairs to enter [employer] of Steps: 3 Entrance Stairs-Rails: None Home Layout: Two level;Bed/bath upstairs;1/2 bath on main level Alternate Level Stairs-Number of Steps: flight Alternate Level Stairs-Rails: Right Home Equipment: Cane - single point Prior Function Level of Independence: Independent Comments: Was independent prior to hospitalization at [facility] for liver tumor excision, but was there about 3 weeks and d/c to [facility] on 07/23/13, admitted here 07/25/13  Communication Communication: No difficulties    Cognition  Cognition Arousal/Alertness: Awake/alert Behavior During Therapy: WFL for tasks assessed/performed Overall Cognitive Status: Within Functional Limits for tasks assessed    Extremity/Trunk Assessment Upper Extremity Assessment Upper Extremity Assessment: Defer to OT evaluation Lower Extremity Assessment Lower Extremity Assessment: RLE deficits/detail;LLE deficits/detail RLE Deficits / Details: LE's edematous; AROM WFL, strength hip flexion 3/5, knee extension 4/5, ankle DF4+/5 LLE Deficits / Details: LE's edematous; AROM WFL, strength hip flexion 3/5, knee extension 4/5, ankle DF4+/5   Balance Balance Overall balance assessment: Needs assistance Sitting-balance support: Single extremity supported Sitting balance-Leahy Scale: Fair Sitting balance - Comments: due to scrotal edema tends to lean back and support self with one versus two hands on the bed Standing balance support: Bilateral upper extremity supported Standing balance-Leahy Scale: Poor Standing balance comment: needs UE assist on walker for balance  End of Session PT - End of Session Equipment Utilized During Treatment: Gait belt Activity Tolerance: Patient limited by fatigue;Other (comment) (dizziness) Patient left: in chair;with call bell/phone within reach Nurse Communication: Other (comment) (RN gave meds for  nausea/dizziness, then pt did have bout of N&V)  GP     [facility] 07/30/2013, 11:50 AM Magda Kiel, [place] 07/30/2013

## 2013-07-30 NOTE — Progress Notes (Signed)
Patient has home CPAP unit which he self-administers.  Patient is familiar with equipment and procedure as this is his home regimen.

## 2013-07-30 NOTE — Telephone Encounter (Signed)
I will call the patient

## 2013-07-30 NOTE — Consult Note (Signed)
Reason for Consult: Bipolar disorder and medication management Referring Physician: Dr. Haydee Salter Cancio is an 63 y.o. male.  HPI: Patient was seen and chart reviewed. Patient has been suffering with chronic bipolar disorder and was admitted to [facility] for renal cell carcinoma status post chemotherapy and nephrectomy, who was recently diagnosed with metastatic spot in the liver and underwent recent liver surgery for tumor excision. Patient stated he was transferred from [facility] to [place] on 07/23/13. Patient has extensive psychiatric history, and has been on multiple psych medications included lithium and Depakote. As the patient the lithium was stopped while he wasat [facility] and he is currently not taking lithium. Patient also reported he had 3 seizures, and his medication Wellbutrin was discontinued to prevent further seizures. Patient received Ativan and Keppra.    ROS: As per the history and physical  Mental Status Examination: Patient is awake, alert and oriented to time place person and situation. Patient has fine mood with the appropriate and bright affect. Patient has a pressured speech and has fair eye contact. His thought process is tangential and goal directed. Patient has denied suicidal, homicidal ideations, intentions or plans. Patient has no evidence of auditory or visual hallucinations, delusions, and paranoia. Patient has fair insight judgment and impulse control.  Past Medical History  Diagnosis Date  . Renal cell carcinoma     chemo and xrt--lost rt kidney  . Childhood asthma   . Sinusitis   . Cellulitis     after left foot surgery  . Osteoarthritis   . OSA on CPAP     NPSG 05-08-07 AHI 63.1/hr,desat t0 80%/loud snoring  . Bipolar 1 disorder   . Hyperlipemia   . Mental disorder   . Depression   . GERD (gastroesophageal reflux disease)   . Hypertension   . Allergy   . Anxiety   . Cataract   . Neuromuscular disorder   . Osteoporosis   . Thyroid  disease     Past Surgical History  Procedure Laterality Date  . Foot surgery      left foot toes,   . Bunionectomy    . 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 Cholecystectomy    . Colon surgery    . Kidney surgery  1998    Rt Kidney Removal     Family History  Problem Relation Age of Onset  . Hypertension    . Sleep apnea    . Emphysema    . Prostate cancer    . Arthritis Father   . Colon cancer Father     Social History:  reports that he has never smoked. He has never used smokeless tobacco. He reports that he drinks alcohol. He reports that he does not use illicit drugs.  Allergies: No Known Allergies  Medications: I have reviewed the patient's current medications.  Results for orders placed during the hospital encounter of 07/25/13 (from the past 48 hour(s))  BASIC METABOLIC PANEL     Status: Abnormal   Collection Time    07/29/13  4:40 PM      Result Value Ref Range   Sodium 138  137 - 147 mEq/L   Potassium 4.6  3.7 - 5.3 mEq/L   Chloride 101  96 - 112 mEq/L   CO2 23  19 - 32 mEq/L   Glucose, Bld 125 (*) 70 - 99 mg/dL   BUN 26 (*) 6 - 23 mg/dL   Creatinine, Ser 2.03 (*)  0.50 - 1.35 mg/dL   Calcium 8.6  8.4 - 10.5 mg/dL   GFR calc non Af Amer 33 (*) >90 mL/min   GFR calc Af Amer 39 (*) >90 mL/min   Comment: (NOTE)     The eGFR has been calculated using the CKD EPI equation.     This calculation has not been validated in all clinical situations.     eGFR's persistently <90 mL/min signify possible Chronic Kidney     Disease.  CBC     Status: Abnormal   Collection Time    07/30/13  6:01 AM      Result Value Ref Range   WBC 4.4  4.0 - 10.5 K/uL   RBC 3.04 (*) 4.22 - 5.81 MIL/uL   Hemoglobin 9.6 (*) 13.0 - 17.0 g/dL   HCT 29.9 (*) 39.0 - 52.0 %   MCV 98.4  78.0 - 100.0 fL   MCH 31.6  26.0 - 34.0 pg   MCHC 32.1  30.0 - 36.0 g/dL   RDW 15.3  11.5 - 15.5 %   Platelets 297  150 - 400 K/uL  BASIC METABOLIC PANEL     Status: Abnormal   Collection Time     07/30/13  6:01 AM      Result Value Ref Range   Sodium 140  137 - 147 mEq/L   Potassium 4.9  3.7 - 5.3 mEq/L   Chloride 102  96 - 112 mEq/L   CO2 27  19 - 32 mEq/L   Glucose, Bld 100 (*) 70 - 99 mg/dL   BUN 25 (*) 6 - 23 mg/dL   Creatinine, Ser 2.06 (*) 0.50 - 1.35 mg/dL   Calcium 8.9  8.4 - 10.5 mg/dL   GFR calc non Af Amer 33 (*) >90 mL/min   GFR calc Af Amer 38 (*) >90 mL/min   Comment: (NOTE)     The eGFR has been calculated using the CKD EPI equation.     This calculation has not been validated in all clinical situations.     eGFR's persistently <90 mL/min signify possible Chronic Kidney     Disease.  PROTIME-INR     Status: None   Collection Time    07/30/13  1:05 PM      Result Value Ref Range   Prothrombin Time 13.5  11.6 - 15.2 seconds   INR 1.05  0.00 - 1.49    No results found.  Positive for anxiety, bipolar, mood swings and sleep disturbance Blood pressure 107/45, pulse 48, temperature 98.2 F (36.8 C), temperature source Oral, resp. rate 20, height 6' 1" (1.854 m), weight 101.243 kg (223 lb 3.2 oz), SpO2 96.00%.   Assessment/Plan: Bipolar disorder, most recent episode is mania  Recommendation: Recommended continue his current medication Depakote 750 mg twice daily for mood swings and check a Depakote level for therapeutic window (50-125) Patient does not meet criteria for acute psychiatric hospitalization Patient does not want to change his medications at this time and he wants to consult his primary psychiatrist Dr. Ladoris Gene.  Appreciate psychiatric consultation and followup as clinically required May contact [phone] if needed further assistance  ,JANARDHAHA R. 07/30/2013, 8:30 PM

## 2013-07-31 LAB — CBC
HEMATOCRIT: 29.3 % — AB (ref 39.0–52.0)
Hemoglobin: 9.5 g/dL — ABNORMAL LOW (ref 13.0–17.0)
MCH: 31.9 pg (ref 26.0–34.0)
MCHC: 32.4 g/dL (ref 30.0–36.0)
MCV: 98.3 fL (ref 78.0–100.0)
Platelets: 283 10*3/uL (ref 150–400)
RBC: 2.98 MIL/uL — AB (ref 4.22–5.81)
RDW: 15.1 % (ref 11.5–15.5)
WBC: 4.4 10*3/uL (ref 4.0–10.5)

## 2013-07-31 LAB — PROTIME-INR
INR: 1.12 (ref 0.00–1.49)
Prothrombin Time: 14.2 seconds (ref 11.6–15.2)

## 2013-07-31 LAB — BASIC METABOLIC PANEL
BUN: 24 mg/dL — AB (ref 6–23)
CO2: 27 meq/L (ref 19–32)
CREATININE: 1.94 mg/dL — AB (ref 0.50–1.35)
Calcium: 8.9 mg/dL (ref 8.4–10.5)
Chloride: 102 mEq/L (ref 96–112)
GFR calc Af Amer: 41 mL/min — ABNORMAL LOW (ref >90)
GFR calc non Af Amer: 35 mL/min — ABNORMAL LOW (ref >90)
Glucose, Bld: 104 mg/dL — ABNORMAL HIGH (ref 70–99)
Potassium: 4.7 mEq/L (ref 3.7–5.3)
Sodium: 140 mEq/L (ref 137–147)

## 2013-07-31 LAB — VALPROIC ACID LEVEL: Valproic Acid Lvl: 43 ug/mL — ABNORMAL LOW (ref 50.0–100.0)

## 2013-07-31 MED ORDER — WARFARIN SODIUM 7.5 MG PO TABS
7.5000 mg | ORAL_TABLET | Freq: Once | ORAL | Status: AC
  Administered 2013-07-31: 7.5 mg via ORAL
  Filled 2013-07-31: qty 1

## 2013-07-31 MED ORDER — PROMETHAZINE HCL 25 MG PO TABS: 12.5000 mg | ORAL_TABLET | Freq: Four times a day (QID) | ORAL | Status: DC | PRN

## 2013-07-31 NOTE — Progress Notes (Signed)
ANTICOAGULATION CONSULT NOTE - Follow Up Consult  Pharmacy Consult for lovenox Indication: DVT  No Known Allergies  Patient Measurements: Height: 6\' 1"  (185.4 cm) Weight: 223 lb 3.2 oz (101.243 kg) IBW/kg (Calculated) : 79.9  Vital Signs: Temp: 97.9 F (36.6 C) (02/14 0730) Temp src: Oral (02/14 0730) BP: 122/54 mmHg (02/14 0730) Pulse Rate: 66 (02/14 0730)  Labs:  Recent Labs  07/29/13 1640 07/30/13 0601 07/30/13 1305 07/31/13 0320  HGB  --  9.6*  --  9.5*  HCT  --  29.9*  --  29.3*  PLT  --  297  --  283  LABPROT  --   --  13.5 14.2  INR  --   --  1.05 1.12  CREATININE 2.03* 2.06*  --  1.94*   Estimated Creatinine Clearance: 49.4 ml/min (by C-G formula based on Cr of 1.94).  Assessment: 43 yom continues on therapeutic lovenox for treatment of new bilateral DVT. H/H is low but stable, plts are WNL. No bleeding noted. Known history of CKD with SCr at ~ 2.0 with CrCL ~75ml/min. He is currently on Lovenox and Warfarin for his DVT and is on Day 2 of 5 day overlap. He had seizures on admission but has not had any since.  His   Drug/Drug Interactions:  Anticoagulants / Thyroid Hormones The hypoprothrombinemic effect of warfarin may be increased by levothyroxine.  Goal of Therapy:  Anti-Xa level 0.6-1 units/ml 4hrs after LMWH dose given Monitor platelets by anticoagulation protocol: Yes   Plan:  1. Continue lovenox 100mg  SQ Q12H 2. F/u CBC Q72H, renal fxn and S&S of bleeding 3. Warfarin 7.5mg  PO x 1 tonight.  Rober Minion, PharmD., MS Clinical Pharmacist Pager:  [phone] Thank you for allowing pharmacy to be part of this patients care team. 07/31/2013 10:04 AM

## 2013-07-31 NOTE — Progress Notes (Signed)
Physical Therapy Treatment Patient Details Name: [name] MRN: [id number] DOB: [birth year] Today's Date: 07/31/2013 Time: 6967-8938 PT Time Calculation (min): 26 min  PT Assessment / Plan / Recommendation  History of Present Illness 21 male patient with multiple medical problems including bipolar disorder, renal cell carcinoma status post chemotherapy and nephrectomy, hypertension, and recent tumor excision for mass on the liver. The latter procedure had been performed at [facility] and patient was significantly deconditioned after this procedure so was discharged to [facility] skilled nursing facility on 07/23/2013.  Patient apparently had 3 seizures at his SNF prior to arrival to the emergency department.  Upon arrival to the emergency department he was postictal with left-sided weakness.   PT Comments   Patient able to ambulated and mobilize without N&V today.  Was soiled in urine in bed and gown soiled with food due to shaky hands when eating.  Patient progressing and may be appropriate for home with HHPT if mother can provide assist for safety.  Patient still wants to discuss with admissions coordinator and insurance about CIR.  Will follow acutely.  Follow Up Recommendations  SNF;Supervision/Assistance - 24 hour;Home health PT     Does the patient have the potential to tolerate intense rehabilitation   N/a  Barriers to Discharge  None      Equipment Recommendations  Rolling walker with 5" wheels    Recommendations for Other Services  None  Frequency Min 3X/week   Progress towards PT Goals Progress towards PT goals: Progressing toward goals  Plan Discharge plan needs to be updated    Precautions / Restrictions Precautions Precautions: Fall   Pertinent Vitals/Pain Denies pain currently    Mobility  Bed Mobility Bed Mobility: Supine to Sit Supine to sit: Min assist General bed mobility comments: assist to lift trunk Transfers Overall transfer level: Needs assistance Equipment  used: Rolling walker (2 wheeled) Transfers: Sit to/from Stand Sit to Stand: Min assist General transfer comment: assist for balance Ambulation/Gait Ambulation/Gait assistance: Min assist Ambulation Distance (Feet): 120 Feet Assistive device: Rolling walker (2 wheeled) Gait Pattern/deviations: Step-through pattern;Decreased stride length;Shuffle General Gait Details: shaky and with decreased foot clearance    Exercises General Exercises - Lower Extremity Long Arc Quad: AROM;Both;10 reps;Seated Hip Flexion/Marching: AROM;Both;10 reps;Seated     PT Goals (current goals can now be found in the care plan section)    Visit Information  Last PT Received On: 07/31/13 Assistance Needed: +1 History of Present Illness: 44 male patient with multiple medical problems including bipolar disorder, renal cell carcinoma status post chemotherapy and nephrectomy, hypertension, and recent tumor excision for mass on the liver. The latter procedure had been performed at [facility] and patient was significantly deconditioned after this procedure so was discharged to [facility] skilled nursing facility on 07/23/2013.  Patient apparently had 3 seizures at his SNF prior to arrival to the emergency department.  Upon arrival to the emergency department he was postictal with left-sided weakness.    Subjective Data   Want to go down and play the piano.   Cognition  Cognition Arousal/Alertness: Awake/alert Behavior During Therapy: WFL for tasks assessed/performed Overall Cognitive Status: Within Functional Limits for tasks assessed    Balance  Balance Overall balance assessment: Needs assistance Standing balance-Leahy Scale: Poor Standing balance comment: UE assist needed for balance  End of Session PT - End of Session Equipment Utilized During Treatment: Gait belt Activity Tolerance: Patient tolerated treatment well Patient left: in chair;with call bell/phone within reach Nurse Communication: Mobility status  GP     Namira Rosekrans,CYNDI 07/31/2013, 2:43 PM Magda Kiel, [place] 07/31/2013

## 2013-07-31 NOTE — Progress Notes (Signed)
Progress Note  [name] RWE:[id number] DOB: March 11, 1951 DOA: 07/25/2013 PCP: [name], MD  Brief narrative: 55 male patient with multiple medical problems including bipolar disorder, renal cell carcinoma status post chemotherapy and nephrectomy, hypertension, and recent tumor excision for mass on the liver. The latter procedure had been performed at [facility] and patient was significantly deconditioned after this procedure so was discharged to [facility] skilled nursing facility on 07/23/2013.  Patient apparently had 3 seizures at his SNF prior to arrival to the emergency department. The patient was apparently aware of at least one episode in which he remembers his arms being raised above his head that his head turning to the left with the episode apparently lasting 2 minutes. He does not recall any further episodes. EMS reported 2 additional episodes prior to their arrival and then EMS witnessed an additional seizure at which point the patient turned his head to the left and had generalized tonic-clonic activity of the upper extremities. He was subsequently given 5 mg of Versed by EMS which aborted the activiyt. Upon arrival to the emergency department he was postictal with left-sided weakness.  Patient has an extensive psychiatric history. Because of his acute renal failure on chronic kidney disease the lithium he had been on  for many years was discontinued prior to discharge from [place]. He had also recently been started on primidone prior to discharge from [facility]. Patient reported to the admitting physician that "I was concerned of getting epilepsy as one of the side effects from primidone is epilepsy".  Patient reported significant anasarca including lower extremity edema had been ongoing since his surgery at [facility]. This was attributed to his renal function according to the patient.  Assessment/Plan:  New onset seizure -Neurology following - recommend discontinuing Wellbutrin - increased  Depakote dose  -MRI with no acute infarct -EEG without current seizure activity and otherwise is normal -no further seizures since admit   Bilateral DVT/bilateral LE edema -extensive noting extends bilaterally from PTV to CFV -suspect recent surgery/immobilization contributing/etiologic factors and also has been on chronic testosterone injections every two weeks-? Malignancy but path from recent surgery still pending -coumadin -continue Lovenox  Recent resection liver mass (06/30/13) -at [facility]- path pending in EMR-pt endorses he was given results that were "benign"  OBSTRUCTIVE SLEEP APNEA -Resume hour of sleep CPAP  Bipolar 1 disorder, mixed -Lithium discontinued prior to discharge from [facility] -Review of outpatient records reveals patient has had extensive manic episodes when off of this medication therefore we'll ask Psychiatry to weigh in on medication options especially since we are also having to now discontinue Wellbutrin  - no callback at this time from Dr Casimiro Needle, will consult Dr. Hillard Danker  -appreciate consult- no change in meds  CKD (chronic kidney disease), stage III -Stable compared to August 2014 data -had ARF while at [facility] in Jan and required CVVHD -cr gradually trending down, 2.03 today, follow. -pt still needs to establish w/OP nephro- was previously referred to Dr. Lorrene Reid   Grade 1 diastolic dysfunction -Seen on echo 2014 and currently compensated  MALIGNANT NEOPLASM OF KIDNEY EXCEPT PELVIS (INTIAL DX/SURGERY [place] 2002/2003) -no recurrence as of 2011 but recent resection of liver mass Jan 2015 done due to highly suspicious mass ? new met from [facility] but pt says was told pathology was "benign" -MRI of brain pending to rule out metastatic neoplasm -followed by [facility]  Hypothyroidism -Synthroid  Physical deconditioning -Patient prefers not to return to [facility] and requests bed  search for new skilled nursing facility prior to discharge -PT  reconsulted to eval -Await CIR eval  Anemia, chronic disease -Hemoglobin stable  DVT prophylaxis: Full dose Lovenox Code Status: Full Family Communication: patient Disposition Plan/Expected LOS: SNF Monday  Consultants: Neurology  Procedures: Bilateral lower extremity venous duplex  Preliminary: Right: DVT noted in the saphenofemoral junction, common femoral, femoral, popliteal, and posterior tibial veins. There appears to be superficial thrombosis in the greater saphenous vein. No Baker's cyst. Left: DVT noted in the common femoral, femoral, popliteal, and posterior tibial veins. No evidence of superficial thrombosis. No Baker's cyst. Technically difficult study due to the patient's body habitus.  Antibiotics: None  HPI/Subjective: C/o nausea  Objective: Blood pressure 122/54, pulse 66, temperature 97.9 F (36.6 C), temperature source Oral, resp. rate 20, height 6' 1"  (1.854 m), weight 101.243 kg (223 lb 3.2 oz), SpO2 97.00%.  Intake/Output Summary (Last 24 hours) at 07/31/13 2671 Last data filed at 07/31/13 2458  Gross per 24 hour  Intake    360 ml  Output    275 ml  Net     85 ml    Exam: General: No acute respiratory distress Lungs: Clear to auscultation bilaterally without wheezes or crackles, RA Cardiovascular: Regular rate and rhythm without murmur gallop or rub normal S1 and S2, +1 edema without  JVD Abdomen: Nontender, nondistended, soft, bowel sounds positive, no rebound, no ascites, no appreciable mass Extremities: No calf tenderness, +1 edema, Neurological: Alert and oriented x 3, moves all extremities x 4 without focal neurological deficits, CN 2-12 intact  Scheduled Meds:  Scheduled Meds: . amLODipine  10 mg Oral Daily  . baclofen  10 mg Oral TID  . divalproex  750 mg Oral Q12H  . enoxaparin (LOVENOX) injection  100 mg Subcutaneous Q12H  . levothyroxine  50 mcg Oral QAC breakfast  . pantoprazole  40 mg Oral Daily  . propranolol  20 mg Oral TID  .  protein supplement  1 scoop Oral BID WC  . sennosides-docusate sodium  2 tablet Oral BID  . sodium chloride  3 mL Intravenous Q12H  . tamsulosin  0.4 mg Oral QPC breakfast  . warfarin   Does not apply Once  . Warfarin - Pharmacist Dosing Inpatient   Does not apply q1800    Data Reviewed: Basic Metabolic Panel:  Recent Labs Lab 07/25/13 2122 07/25/13 2135  07/26/13 0335 07/27/13 0252 07/29/13 1640 07/30/13 0601 07/31/13 0320  NA 142  --   < > 141 142 138 140 140  K 5.5*  --   < > 4.9 4.6 4.6 4.9 4.7  CL 102  --   < > 104 104 101 102 102  CO2 22  --   --  25 26 23 27 27   GLUCOSE 140*  --   < > 118* 78 125* 100* 104*  BUN 30*  --   < > 29* 25* 26* 25* 24*  CREATININE 2.54*  --   < > 2.28* 2.16* 2.03* 2.06* 1.94*  CALCIUM 8.9  --   --  8.8 8.9 8.6 8.9 8.9  MG  --  3.0*  --   --   --   --   --   --   < > = values in this interval not displayed. Liver Function Tests:  Recent Labs Lab 07/25/13 2122 07/26/13 0335  AST 30 27  ALT 32 31  ALKPHOS 99 91  BILITOT <0.2* <0.2*  PROT 6.6 6.2  ALBUMIN 2.7* 2.5*  Recent Labs Lab 07/25/13 2123  AMMONIA 26   CBC:  Recent Labs Lab 07/25/13 2122 07/25/13 2209 07/26/13 0335 07/27/13 0252 07/30/13 0601 07/31/13 0320  WBC 7.6  --  5.1 4.3 4.4 4.4  NEUTROABS 5.9  --   --   --   --   --   HGB 9.6* 11.9* 8.8* 9.1* 9.6* 9.5*  HCT 29.8* 35.0* 26.9* 28.7* 29.9* 29.3*  MCV 97.4  --  96.8 98.3 98.4 98.3  PLT 378  --  360 289 297 283   CBG:  Recent Labs Lab 07/25/13 2151  GLUCAP 130*    Recent Results (from the past 240 hour(s))  CULTURE, BLOOD (ROUTINE X 2)     Status: None   Collection Time    07/25/13  8:58 PM      Result Value Ref Range Status   Specimen Description BLOOD LEFT HAND   Final   Special Requests BOTTLES DRAWN AEROBIC AND ANAEROBIC 5CC EACH   Final   Culture  Setup Time     Final   Value: 07/26/2013 04:12     Performed at [facility]   Culture     Final   Value:        BLOOD CULTURE  RECEIVED NO GROWTH TO DATE CULTURE WILL BE HELD FOR 5 DAYS BEFORE ISSUING A FINAL NEGATIVE REPORT     Performed at [facility]   Report Status PENDING   Incomplete  CULTURE, BLOOD (ROUTINE X 2)     Status: None   Collection Time    07/25/13  9:12 PM      Result Value Ref Range Status   Specimen Description BLOOD RIGHT ARM   Final   Special Requests BOTTLES DRAWN AEROBIC ONLY 10CC   Final   Culture  Setup Time     Final   Value: 07/26/2013 04:12     Performed at [facility]   Culture     Final   Value:        BLOOD CULTURE RECEIVED NO GROWTH TO DATE CULTURE WILL BE HELD FOR 5 DAYS BEFORE ISSUING A FINAL NEGATIVE REPORT     Performed at [facility]   Report Status PENDING   Incomplete  MRSA PCR SCREENING     Status: None   Collection Time    07/26/13  1:41 AM      Result Value Ref Range Status   MRSA by PCR NEGATIVE  NEGATIVE Final   Comment:            The GeneXpert MRSA Assay (FDA     approved for NASAL specimens     only), is one component of a     comprehensive MRSA colonization     surveillance program. It is not     intended to diagnose MRSA     infection nor to guide or     monitor treatment for     MRSA infections.       Time spent : 35 mins    [name] [facility] Office  [phone] Pager [phone]   On-Call/Text Page:      [email or web address]      password [email or web address]  If 7PM-7AM, please contact night-coverage [email or web address] Password [facility] 07/31/2013, 9:27 AM   LOS: 6 days

## 2013-08-01 LAB — CULTURE, BLOOD (ROUTINE X 2)
CULTURE: NO GROWTH
Culture: NO GROWTH

## 2013-08-01 LAB — PROTIME-INR
INR: 1.17 (ref 0.00–1.49)
Prothrombin Time: 14.7 seconds (ref 11.6–15.2)

## 2013-08-01 MED ORDER — WARFARIN SODIUM 10 MG PO TABS
10.0000 mg | ORAL_TABLET | Freq: Once | ORAL | Status: AC
  Administered 2013-08-01: 10 mg via ORAL
  Filled 2013-08-01: qty 1

## 2013-08-01 NOTE — Progress Notes (Addendum)
ANTICOAGULATION CONSULT NOTE - Follow Up Consult  Pharmacy Consult for lovenox Indication: DVT  Addendum: Spoke with Mr. Ripberger regarding his Warfarin - I feel he can most likely follow a simple Warfarin regimen.  However, should his Warfarin dosing be more complex he will need to have this clearly spelled out and documented on paper for him to follow.  Rober Minion, PharmD., MS Clinical Pharmacist  No Known Allergies  Patient Measurements: Height: 6\' 1"  (185.4 cm) Weight: 224 lb 13.9 oz (102 kg) IBW/kg (Calculated) : 79.9  Vital Signs: Temp: 98.5 F (36.9 C) (02/15 0546) Temp src: Axillary (02/15 0546) BP: 124/62 mmHg (02/15 0546) Pulse Rate: 51 (02/15 0546)  Labs:  Recent Labs  07/29/13 1640 07/30/13 0601 07/30/13 1305 07/31/13 0320 08/01/13 0605  HGB  --  9.6*  --  9.5*  --   HCT  --  29.9*  --  29.3*  --   PLT  --  297  --  283  --   LABPROT  --   --  13.5 14.2 14.7  INR  --   --  1.05 1.12 1.17  CREATININE 2.03* 2.06*  --  1.94*  --    Estimated Creatinine Clearance: 49.5 ml/min (by C-G formula based on Cr of 1.94).  Assessment: 40 yom continues on therapeutic lovenox for treatment of new bilateral DVT. H/H is low but stable, plts are WNL. No bleeding noted. Known history of CKD with SCr at ~ 2.0 with CrCL ~65ml/min. He is currently on Lovenox and Warfarin for his DVT and is on Day 3 of 5 day overlap. He had seizures on admission but has not had any since.  He is a slow responder to Warfarin with INR 1.05>1.12>1.17 after two doses of 7.5mg  Warfarin.  He is without noted bleeding complications.  Will increase his dose tonight, would expect to see a larger increase in the next 48 hours.  Drug/Drug Interactions:  Anticoagulants / Thyroid Hormones The hypoprothrombinemic effect of warfarin may be increased by levothyroxine.  Goal of Therapy:  Anti-Xa level 0.6-1 units/ml 4hrs after LMWH dose given Monitor platelets by anticoagulation protocol: Yes   Plan:  1.  Continue lovenox 100mg  SQ Q12H 2. F/u CBC Q72H, renal fxn and S&S of bleeding 3. Warfarin 10mg  PO x 1 tonight.  Rober Minion, PharmD., MS Clinical Pharmacist Pager:  [phone] Thank you for allowing pharmacy to be part of this patients care team. 08/01/2013 7:21 AM  Addendum: Damaris Schooner with Mr. Sprung regarding his Warfarin - I feel he can follow simple regimens with Warfarin therapy.  Should his regimen require a more complex regimen - this will need to be clearly spelled out and documented on paper for him to follow.  Rober Minion, PharmD., [place] Clinical Pharmacist

## 2013-08-01 NOTE — Progress Notes (Signed)
Progress Note  [name] ALP:[id number] DOB: [birth year] DOA: 07/25/2013 PCP: [name], MD  Brief narrative: 89 male patient with multiple medical problems including bipolar disorder, renal cell carcinoma status post chemotherapy and nephrectomy, hypertension, and recent tumor excision for mass on the liver. The latter procedure had been performed at [facility] and patient was significantly deconditioned after this procedure so was discharged to [facility] skilled nursing facility on 07/23/2013.  Patient apparently had 3 seizures at his SNF prior to arrival to the emergency department. The patient was apparently aware of at least one episode in which he remembers his arms being raised above his head that his head turning to the left with the episode apparently lasting 2 minutes. He does not recall any further episodes. EMS reported 2 additional episodes prior to their arrival and then EMS witnessed an additional seizure at which point the patient turned his head to the left and had generalized tonic-clonic activity of the upper extremities. He was subsequently given 5 mg of Versed by EMS which aborted the activiyt. Upon arrival to the emergency department he was postictal with left-sided weakness.  Patient has an extensive psychiatric history. Because of his acute renal failure on chronic kidney disease the lithium he had been on  for many years was discontinued prior to discharge from [place]. He had also recently been started on primidone prior to discharge from [facility]. Patient reported to the admitting physician that "I was concerned of getting epilepsy as one of the side effects from primidone is epilepsy".  Patient reported significant anasarca including lower extremity edema had been ongoing since his surgery at [facility]. This was attributed to his renal function according to the patient.  Assessment/Plan:  New onset seizure -Neurology following - recommend discontinuing Wellbutrin - increased  Depakote dose  -MRI with no acute infarct -EEG without current seizure activity and otherwise is normal -no further seizures since admit   Bilateral DVT/bilateral LE edema -extensive noting extends bilaterally from PTV to CFV -suspect recent surgery/immobilization contributing/etiologic factors and also has been on chronic testosterone injections every two weeks-? Malignancy but path from recent surgery still pending -coumadin -continue Lovenox  Recent resection liver mass (06/30/13) -at [facility]- path pending in EMR-pt endorses he was given results that were "benign"  OBSTRUCTIVE SLEEP APNEA -Resume hour of sleep CPAP  Bipolar 1 disorder, mixed -Lithium discontinued prior to discharge from [place] -Review of outpatient records reveals patient has had extensive manic episodes when off of this medication therefore we'll ask Psychiatry to weigh in on medication options especially since we are also having to now discontinue Wellbutrin  - no callback at this time from Dr Casimiro Needle -appreciate consult from psych here- no change in meds  CKD (chronic kidney disease), stage III -Stable compared to August 2014 data -had ARF while at [facility] in Jan and required CVVHD -cr gradually trending down -pt still needs to establish w/OP nephro- was previously referred to Dr. Lorrene Reid   Grade 1 diastolic dysfunction -Seen on echo 2014 and currently compensated  MALIGNANT NEOPLASM OF KIDNEY EXCEPT PELVIS (INTIAL DX/SURGERY [place] 2002/2003) -no recurrence as of 2011 but recent resection of liver mass Jan 2015 done due to highly suspicious mass ? new met from [facility] but pt says was told pathology was "benign" -MRI of brain pending to rule out metastatic neoplasm -followed by [facility]  Hypothyroidism -Synthroid  Physical deconditioning -Patient prefers not to return to [facility] and requests bed search for new skilled nursing  facility prior to discharge -PT reconsulted to eval -patient prefers  [facility]  Anemia, chronic disease -Hemoglobin stable  DVT prophylaxis: Full dose Lovenox Code Status: Full Family Communication: patient Disposition Plan/Expected LOS: SNF Monday  Consultants: Neurology  Procedures: Bilateral lower extremity venous duplex  Preliminary: Right: DVT noted in the saphenofemoral junction, common femoral, femoral, popliteal, and posterior tibial veins. There appears to be superficial thrombosis in the greater saphenous vein. No Baker's cyst. Left: DVT noted in the common femoral, femoral, popliteal, and posterior tibial veins. No evidence of superficial thrombosis. No Baker's cyst. Technically difficult study due to the patient's body habitus.  Antibiotics: None  HPI/Subjective: Nausea better Walked some yesterday  Objective: Blood pressure 124/62, pulse 51, temperature 98.5 F (36.9 C), temperature source Axillary, resp. rate 16, height 6' 1"  (1.854 m), weight 102 kg (224 lb 13.9 oz), SpO2 100.00%.  Intake/Output Summary (Last 24 hours) at 08/01/13 0908 Last data filed at 08/01/13 0700  Gross per 24 hour  Intake   1203 ml  Output   1815 ml  Net   -612 ml    Exam: General: No acute respiratory distress Lungs: Clear to auscultation bilaterally without wheezes or crackles, RA Cardiovascular: Regular rate and rhythm without murmur gallop or rub normal S1 and S2, +1 edema without  JVD Abdomen: Nontender, nondistended, soft, bowel sounds positive, no rebound, no ascites, no appreciable mass Extremities: No calf tenderness, +1 edema, Neurological: Alert and oriented x 3, moves all extremities x 4 without focal neurological deficits, CN 2-12 intact  Scheduled Meds:  Scheduled Meds: . amLODipine  10 mg Oral Daily  . baclofen  10 mg Oral TID  . divalproex  750 mg Oral Q12H  . enoxaparin (LOVENOX) injection  100 mg Subcutaneous Q12H  . levothyroxine  50 mcg Oral QAC breakfast  . pantoprazole  40 mg Oral Daily  . propranolol  20 mg Oral TID  . protein  supplement  1 scoop Oral BID WC  . sennosides-docusate sodium  2 tablet Oral BID  . sodium chloride  3 mL Intravenous Q12H  . tamsulosin  0.4 mg Oral QPC breakfast  . warfarin  10 mg Oral ONCE-1800  . Warfarin - Pharmacist Dosing Inpatient   Does not apply q1800    Data Reviewed: Basic Metabolic Panel:  Recent Labs Lab 07/25/13 2122 07/25/13 2135  07/26/13 0335 07/27/13 0252 07/29/13 1640 07/30/13 0601 07/31/13 0320  NA 142  --   < > 141 142 138 140 140  K 5.5*  --   < > 4.9 4.6 4.6 4.9 4.7  CL 102  --   < > 104 104 101 102 102  CO2 22  --   --  25 26 23 27 27   GLUCOSE 140*  --   < > 118* 78 125* 100* 104*  BUN 30*  --   < > 29* 25* 26* 25* 24*  CREATININE 2.54*  --   < > 2.28* 2.16* 2.03* 2.06* 1.94*  CALCIUM 8.9  --   --  8.8 8.9 8.6 8.9 8.9  MG  --  3.0*  --   --   --   --   --   --   < > = values in this interval not displayed. Liver Function Tests:  Recent Labs Lab 07/25/13 2122 07/26/13 0335  AST 30 27  ALT 32 31  ALKPHOS 99 91  BILITOT <0.2* <0.2*  PROT 6.6 6.2  ALBUMIN 2.7* 2.5*    Recent Labs Lab 07/25/13  2123  AMMONIA 26   CBC:  Recent Labs Lab 07/25/13 2122 07/25/13 2209 07/26/13 0335 07/27/13 0252 07/30/13 0601 07/31/13 0320  WBC 7.6  --  5.1 4.3 4.4 4.4  NEUTROABS 5.9  --   --   --   --   --   HGB 9.6* 11.9* 8.8* 9.1* 9.6* 9.5*  HCT 29.8* 35.0* 26.9* 28.7* 29.9* 29.3*  MCV 97.4  --  96.8 98.3 98.4 98.3  PLT 378  --  360 289 297 283   CBG:  Recent Labs Lab 07/25/13 2151  GLUCAP 130*    Recent Results (from the past 240 hour(s))  CULTURE, BLOOD (ROUTINE X 2)     Status: None   Collection Time    07/25/13  8:58 PM      Result Value Ref Range Status   Specimen Description BLOOD LEFT HAND   Final   Special Requests BOTTLES DRAWN AEROBIC AND ANAEROBIC 5CC EACH   Final   Culture  Setup Time     Final   Value: 07/26/2013 04:12     Performed at [facility]   Culture     Final   Value:        BLOOD CULTURE RECEIVED NO  GROWTH TO DATE CULTURE WILL BE HELD FOR 5 DAYS BEFORE ISSUING A FINAL NEGATIVE REPORT     Performed at [facility]   Report Status PENDING   Incomplete  CULTURE, BLOOD (ROUTINE X 2)     Status: None   Collection Time    07/25/13  9:12 PM      Result Value Ref Range Status   Specimen Description BLOOD RIGHT ARM   Final   Special Requests BOTTLES DRAWN AEROBIC ONLY 10CC   Final   Culture  Setup Time     Final   Value: 07/26/2013 04:12     Performed at [facility]   Culture     Final   Value:        BLOOD CULTURE RECEIVED NO GROWTH TO DATE CULTURE WILL BE HELD FOR 5 DAYS BEFORE ISSUING A FINAL NEGATIVE REPORT     Performed at [facility]   Report Status PENDING   Incomplete  MRSA PCR SCREENING     Status: None   Collection Time    07/26/13  1:41 AM      Result Value Ref Range Status   MRSA by PCR NEGATIVE  NEGATIVE Final   Comment:            The GeneXpert MRSA Assay (FDA     approved for NASAL specimens     only), is one component of a     comprehensive MRSA colonization     surveillance program. It is not     intended to diagnose MRSA     infection nor to guide or     monitor treatment for     MRSA infections.       Time spent : 35 mins    Eulogio Bear [facility] Office  [phone] Pager [phone]   On-Call/Text Page:      [email or web address]      password [email or web address]  If 7PM-7AM, please contact night-coverage [email or web address] Password [email or web address] 08/01/2013, 9:08 AM   LOS: 7 days

## 2013-08-01 NOTE — Discharge Instructions (Signed)
Information on my medicine - Coumadin   (Warfarin)  This medication education was reviewed with me or my healthcare representative as part of my discharge preparation.  The pharmacist that spoke with me during my hospital stay was:  Tomasita Morrow, [facility]  Why was Coumadin prescribed for you? Coumadin was prescribed for you because you have a blood clot or a medical condition that can cause an increased risk of forming blood clots. Blood clots can cause serious health problems by blocking the flow of blood to the heart, lung, or brain. Coumadin can prevent harmful blood clots from forming. As a reminder your indication for Coumadin is:   Deep Vein Thrombosis Treatment  What test will check on my response to Coumadin? While on Coumadin (warfarin) you will need to have an INR test regularly to ensure that your dose is keeping you in the desired range. The INR (international normalized ratio) number is calculated from the result of the laboratory test called prothrombin time (PT).  If an INR APPOINTMENT HAS NOT ALREADY BEEN MADE FOR YOU please schedule an appointment to have this lab work done by your health care provider within 7 days. Your INR goal is usually a number between:  2 to 3 or your provider may give you a more narrow range like 2-2.5.  Ask your health care provider during an office visit what your goal INR is.  What  do you need to  know  About  COUMADIN? Take Coumadin (warfarin) exactly as prescribed by your healthcare provider about the same time each day.  DO NOT stop taking without talking to the doctor who prescribed the medication.  Stopping without other blood clot prevention medication to take the place of Coumadin may increase your risk of developing a new clot or stroke.  Get refills before you run out.  What do you do if you miss a dose? If you miss a dose, take it as soon as you remember on the same day then continue your regularly scheduled regimen the next day.  Do  not take two doses of Coumadin at the same time.  Important Safety Information A possible side effect of Coumadin (Warfarin) is an increased risk of bleeding. You should call your healthcare provider right away if you experience any of the following:   Bleeding from an injury or your nose that does not stop.   Unusual colored urine (red or dark brown) or unusual colored stools (red or black).   Unusual bruising for unknown reasons.   A serious fall or if you hit your head (even if there is no bleeding).  Some foods or medicines interact with Coumadin (warfarin) and might alter your response to warfarin. To help avoid this:   Eat a balanced diet, maintaining a consistent amount of Vitamin K.   Notify your provider about major diet changes you plan to make.   Avoid alcohol or limit your intake to 1 drink for women and 2 drinks for men per day. (1 drink is 5 oz. wine, 12 oz. beer, or 1.5 oz. liquor.)  Make sure that ANY health care provider who prescribes medication for you knows that you are taking Coumadin (warfarin).  Also make sure the healthcare provider who is monitoring your Coumadin knows when you have started a new medication including herbals and non-prescription products.  Coumadin (Warfarin)  Major Drug Interactions  Increased Warfarin Effect Decreased Warfarin Effect  Alcohol (large quantities) Antibiotics (esp. Septra/Bactrim, Flagyl, Cipro) Amiodarone (Cordarone) Aspirin (ASA)  Cimetidine (Tagamet) Megestrol (Megace) NSAIDs (ibuprofen, naproxen, etc.) Piroxicam (Feldene) Propafenone (Rythmol SR) Propranolol (Inderal) Isoniazid (INH) Posaconazole (Noxafil) Barbiturates (Phenobarbital) Carbamazepine (Tegretol) Chlordiazepoxide (Librium) Cholestyramine (Questran) Griseofulvin Oral Contraceptives Rifampin Sucralfate (Carafate) Vitamin K   Coumadin (Warfarin) Major Herbal Interactions  Increased Warfarin Effect Decreased Warfarin Effect  Garlic Ginseng Ginkgo  biloba Coenzyme Q10 Green tea St. Johns wort    Coumadin (Warfarin) FOOD Interactions  Eat a consistent number of servings per week of foods HIGH in Vitamin K (1 serving =  cup)  Collards (cooked, or boiled & drained) Kale (cooked, or boiled & drained) Mustard greens (cooked, or boiled & drained) Parsley *serving size only =  cup Spinach (cooked, or boiled & drained) Swiss chard (cooked, or boiled & drained) Turnip greens (cooked, or boiled & drained)  Eat a consistent number of servings per week of foods MEDIUM-HIGH in Vitamin K (1 serving = 1 cup)  Asparagus (cooked, or boiled & drained) Broccoli (cooked, boiled & drained, or raw & chopped) Brussel sprouts (cooked, or boiled & drained) *serving size only =  cup Lettuce, raw (green leaf, endive, romaine) Spinach, raw Turnip greens, raw & chopped   These websites have more information on Coumadin (warfarin):  FailFactory.se; VeganReport.com.au;

## 2013-08-02 LAB — BASIC METABOLIC PANEL
BUN: 22 mg/dL (ref 6–23)
CO2: 28 mEq/L (ref 19–32)
Calcium: 9.5 mg/dL (ref 8.4–10.5)
Chloride: 103 mEq/L (ref 96–112)
Creatinine, Ser: 1.92 mg/dL — ABNORMAL HIGH (ref 0.50–1.35)
GFR, EST AFRICAN AMERICAN: 41 mL/min — AB (ref >90)
GFR, EST NON AFRICAN AMERICAN: 36 mL/min — AB (ref >90)
GLUCOSE: 93 mg/dL (ref 70–99)
Potassium: 4.8 mEq/L (ref 3.7–5.3)
SODIUM: 140 meq/L (ref 137–147)

## 2013-08-02 LAB — CBC
HCT: 34.1 % — ABNORMAL LOW (ref 39.0–52.0)
Hemoglobin: 10.7 g/dL — ABNORMAL LOW (ref 13.0–17.0)
MCH: 30.7 pg (ref 26.0–34.0)
MCHC: 31.4 g/dL (ref 30.0–36.0)
MCV: 98 fL (ref 78.0–100.0)
PLATELETS: 219 10*3/uL (ref 150–400)
RBC: 3.48 MIL/uL — AB (ref 4.22–5.81)
RDW: 15 % (ref 11.5–15.5)
WBC: 4.3 10*3/uL (ref 4.0–10.5)

## 2013-08-02 LAB — PROTIME-INR
INR: 1.37 (ref 0.00–1.49)
Prothrombin Time: 16.5 seconds — ABNORMAL HIGH (ref 11.6–15.2)

## 2013-08-02 MED ORDER — ENOXAPARIN SODIUM 100 MG/ML ~~LOC~~ SOLN
100.0000 mg | Freq: Two times a day (BID) | SUBCUTANEOUS | Status: DC
Start: 2013-08-02 — End: 2013-09-29

## 2013-08-02 MED ORDER — PROMETHAZINE HCL 12.5 MG PO TABS: 12.5000 mg | ORAL_TABLET | Freq: Four times a day (QID) | ORAL | Status: DC | PRN

## 2013-08-02 MED ORDER — DIVALPROEX SODIUM ER 250 MG PO TB24: 750.0000 mg | ORAL_TABLET | Freq: Two times a day (BID) | ORAL | Status: AC

## 2013-08-02 MED ORDER — WARFARIN SODIUM 10 MG PO TABS: 10.0000 mg | ORAL_TABLET | Freq: Every day | ORAL | Status: DC

## 2013-08-02 MED ORDER — WARFARIN SODIUM 10 MG PO TABS
10.0000 mg | ORAL_TABLET | Freq: Once | ORAL | Status: AC
  Administered 2013-08-02: 10 mg via ORAL
  Filled 2013-08-02: qty 1

## 2013-08-02 NOTE — Progress Notes (Signed)
ANTICOAGULATION CONSULT NOTE - Follow Up Consult  Pharmacy Consult for lovenox Indication: DVT  No Known Allergies  Patient Measurements: Height: 6\' 1"  (185.4 cm) Weight: 224 lb 13.9 oz (102 kg) IBW/kg (Calculated) : 79.9  Vital Signs: Temp: 98.7 F (37.1 C) (02/16 0615) Temp src: Oral (02/16 0615) BP: 117/53 mmHg (02/16 0615) Pulse Rate: 56 (02/16 0615)  Labs:  Recent Labs  07/31/13 0320 08/01/13 0605 08/02/13 0734  HGB 9.5*  --  10.7*  HCT 29.3*  --  34.1*  PLT 283  --  219  LABPROT 14.2 14.7 16.5*  INR 1.12 1.17 1.37  CREATININE 1.94*  --  1.92*   Estimated Creatinine Clearance: 50 ml/min (by C-G formula based on Cr of 1.92).  Assessment: 33 yom continues on therapeutic lovenox for treatment of new bilateral DVT. Day 4/5 of overlap INR today = 1.37 CBC stable  Goal of Therapy:  Anti-Xa level 0.6-1 units/ml 4hrs after LMWH dose given Monitor platelets by anticoagulation protocol: Yes   Plan:  1. Continue lovenox 100mg  SQ Q12H 2. F/u CBC Q72H, renal fxn and S&S of bleeding 3. Warfarin 10mg  PO x 1 tonight.  Thank you. Anette Guarneri, PharmD [phone]  08/02/2013 8:49 AM

## 2013-08-02 NOTE — Progress Notes (Signed)
Physical Therapy Treatment Patient Details Name: [name] MRN: [id number] DOB: February 11, 1951 Today's Date: 08/02/2013 Time: 9381-8299 PT Time Calculation (min): 36 min  PT Assessment / Plan / Recommendation  History of Present Illness 21 male patient with multiple medical problems including bipolar disorder, renal cell carcinoma status post chemotherapy and nephrectomy, hypertension, and recent tumor excision for mass on the liver. The latter procedure had been performed at [facility] and patient was significantly deconditioned after this procedure so was discharged to [facility] skilled nursing facility on 07/23/2013.  Patient apparently had 3 seizures at his SNF prior to arrival to the emergency department.  Upon arrival to the emergency department he was postictal with left-sided weakness.   PT Comments   Patient progressing with ambulation distance, but still limited tolerance with dizziness/nausea and global weakness.  Feel SNF level rehab will benefit pt prior to d/c to [facility] until can be independent at home.  Did notify rehab coordinator to follow up with patient regarding continued questions on his eligibility for their program.  Follow Up Recommendations  SNF;Supervision/Assistance - 24 hour;Home health PT     Does the patient have the potential to tolerate intense rehabilitation   N/A  Barriers to Discharge  None      Equipment Recommendations  Rolling walker with 5" wheels    Recommendations for Other Services  None  Frequency Min 3X/week   Progress towards PT Goals Progress towards PT goals: Progressing toward goals  Plan Current plan remains appropriate    Precautions / Restrictions Precautions Precautions: Fall   Pertinent Vitals/Pain     Mobility  Bed Mobility Overal bed mobility: Needs Assistance Bed Mobility: Supine to Sit Supine to sit: Min guard General bed mobility comments: heavy rail use and effort to come to sit Transfers Overall transfer level:  Needs assistance Equipment used: Rolling walker (2 wheeled) Transfers: Sit to/from Stand Sit to Stand: Min assist General transfer comment: assist for balance Ambulation/Gait Ambulation/Gait assistance: Min guard Ambulation Distance (Feet): 160 Feet Assistive device: Rolling walker (2 wheeled) Gait Pattern/deviations: Step-through pattern;Shuffle;Trunk flexed;Wide base of support;Decreased stride length General Gait Details: shaky and with decreased foot clearance      PT Goals (current goals can now be found in the care plan section)    Visit Information  Last PT Received On: 08/02/13 Assistance Needed: +1 History of Present Illness: 36 male patient with multiple medical problems including bipolar disorder, renal cell carcinoma status post chemotherapy and nephrectomy, hypertension, and recent tumor excision for mass on the liver. The latter procedure had been performed at [facility] and patient was significantly deconditioned after this procedure so was discharged to [facility] skilled nursing facility on 07/23/2013.  Patient apparently had 3 seizures at his SNF prior to arrival to the emergency department.  Upon arrival to the emergency department he was postictal with left-sided weakness.    Subjective Data   Should I try to do dips on this thing?   Cognition  Cognition Arousal/Alertness: Awake/alert Behavior During Therapy: WFL for tasks assessed/performed Overall Cognitive Status: Within Functional Limits for tasks assessed    Balance  Balance Sitting-balance support: Feet supported Sitting balance-Leahy Scale: Fair Standing balance support: No upper extremity supported;During functional activity Standing balance-Leahy Scale: Poor Standing balance comment: requires at least minguard for safety standing about 1 minute without UE assist for toileting  End of Session PT - End of Session Equipment Utilized During Treatment: Gait belt Activity Tolerance: Patient limited by fatigue  (dizziness and a little nausea after  ambulation) Patient left: in chair;with call bell/phone within reach   GP     [facility] 08/02/2013, 1:19 PM [place], [place] 08/02/2013

## 2013-08-02 NOTE — Clinical Social Work Note (Signed)
CSW visited with patient today to get a decision regarding the facility he wanted for short-term rehab as inpatient rehab declined patient. Although [facility] said no and [facility] did not respond, patient wanted follow-up with these facilities. Calls made to the admissions staff at both facilities and patient declined. Mr. Mccardle informed that neither facility would accept him and a SNF decision was again requested. Patient declined to give CSW a facility choice, indicating that he had to talk with his insurance company before giving CSW a decision. CSW attempted to assist patient in understand that he is ready for discharge and a decision needed, however patient refused to provide a facility choice. MD updated.  Yitzchak Kothari Givens, MSW, LCSW [phone]

## 2013-08-02 NOTE — Discharge Summary (Signed)
Physician Discharge Summary  Lehman Whiteley Shiel DHR:[id number] DOB: [birth year] DOA: 07/25/2013  PCP: [name], MD  Admit date: 07/25/2013 Discharge date: 08/02/2013  Time spent: 35 minutes  Recommendations for Outpatient Follow-up:  1. PT/INR Wed 2. lovenox until INR 2-3 and then coumadin only 3. CPAP at night 4. Monitor depakote levels periodically 5. Follow up with Dr. Elisabeth Most 6. Follow up with psych dr 7. Follow up at [facility] as per before   Discharge Diagnoses:  Active Problems:   MALIGNANT NEOPLASM OF KIDNEY EXCEPT PELVIS   OBSTRUCTIVE SLEEP APNEA   Bipolar 1 disorder, mixed   CKD (chronic kidney disease), stage III   New onset seizure   Physical deconditioning   Bilateral lower extremity edema   Anemia, chronic disease   DVT of lower extremity, bilateral   Discharge Condition: [place]  Diet recommendation: cardiac  Filed Weights   07/27/13 2118 07/28/13 2043 07/31/13 2219  Weight: 100.5 kg (221 lb 9 oz) 101.243 kg (223 lb 3.2 oz) 102 kg (224 lb 13.9 oz)    History of present illness:  63 year old male with history of bipolar disorder, renal cell carcinoma status post chemotherapy but therapy and nephrectomy, GERD, hypertension who was recently diagnosed with metastatic spot in the liver and underwent recent liver surgery for tumor excision. Patient was told that cancer was excised with good margins and 25% of the liver was removed. Patient was transferred from [facility] to [place] on 07/23/13. Patient has extensive psychiatric history, and has been on multiple psych medications included lithium and Depakote. As the patient the lithium was stopped by he was at [facility] and he is currently not taking lithium.  Today patient was found to have 3 seizures, surprisingly patient is able to remember these events and he describes that his arms went up in the air and it was shaking. Patient had one seizure in the ambulance and fifth seizure while he was in the ED. Last 2 seizures were witnessed.  Patient received Ativan and 1500 mg of Keppra in the ED.  Patient is not post at this time, he'll he is diapers as he has anasarca since his surgery and Lasix has been tried as per [facility] physicians but held due to worsening renal functions.  At this time he denies chest pain, no shortness of breath no nausea vomiting or diarrhea no fever no dysuria urgency or frequency of urination. Patient also was on Primidone, which made him shake so that was stopped. Patient told me that , "I was concerned of getting epilepsy as one of the side effects of primidone is epilepsy."   Hospital Course:  New onset seizure  -Neurology following - recommend discontinuing Wellbutrin - increased Depakote dose  -MRI with no acute infarct  -EEG without current seizure activity and otherwise is normal  -no further seizures since admit   Bilateral DVT/bilateral LE edema  -extensive noting extends bilaterally from PTV to CFV  -suspect recent surgery/immobilization contributing/etiologic factors and also has been on chronic testosterone injections every two weeks-? Malignancy but path from recent surgery still pending  -coumadin goal 2-3 -continue Lovenox until INR therapeutic  Recent resection liver mass (06/30/13)  -at [facility]- path pending in EMR-pt endorses he was given results that were "benign"   OBSTRUCTIVE SLEEP APNEA  -Resume hour of sleep CPAP   Bipolar 1 disorder, mixed  -Lithium discontinued prior to discharge from [place]  -Review of outpatient records reveals patient has had extensive manic episodes when off of this medication therefore we'll ask  Psychiatry to weigh in on medication options especially since we are also having to now discontinue Wellbutrin  - no callback at this time from Dr Casimiro Needle, will consult Dr. Hillard Danker  -appreciate consult- no change in meds   CKD (chronic kidney disease), stage III  -Stable compared to August 2014 data  -had ARF while at [facility] in Jan and required CVVHD  -cr  gradually trending down, 2.03 today, follow.  -pt still needs to establish w/OP nephro- was previously referred to Dr. Lorrene Reid  Grade 1 diastolic dysfunction  -Seen on echo 2014 and currently compensated   MALIGNANT NEOPLASM OF KIDNEY EXCEPT PELVIS (INTIAL DX/SURGERY [place] 2002/2003)  -no recurrence as of 2011 but recent resection of liver mass Jan 2015 done due to highly suspicious mass ? new met from [facility] but pt says was told pathology was "benign"  -MRI of brain pending to rule out metastatic neoplasm  -followed by Signa Kell   Hypothyroidism  -Synthroid   Physical deconditioning  -SNF   Anemia, chronic disease  -Hemoglobin stable   Procedures:    Consultations:  Neuro  psych  Discharge Exam: Filed Vitals:   08/02/13 0950  BP: 120/60  Pulse: 72  Temp: 98.1 F (36.7 C)  Resp: 20    General: A+Ox3, NAd Cardiovascular: rrr Respiratory: clear anterior  Discharge Instructions      Discharge Orders   Future Orders Complete By Expires   Diet - low sodium heart healthy  As directed    Discharge instructions  As directed    Comments:     Pt/INR wed Cbc, bmp 1 week Needs follow up with psych and Dr. Lorrene Reid (renal) Continue lovenox BID until INR 2-3   Increase activity slowly  As directed        Medication List    STOP taking these medications       buPROPion 200 MG 12 hr tablet  Commonly known as:  WELLBUTRIN SR     FA-B6-B12-OMEGA 3-BIOTIN-CR PO     lithium carbonate 300 MG CR tablet  Commonly known as:  LITHOBID     magnesium hydroxide 400 MG/5ML suspension  Commonly known as:  MILK OF MAGNESIA     OVER THE COUNTER MEDICATION      TAKE these medications       amLODipine 10 MG tablet  Commonly known as:  NORVASC  Take 10 mg by mouth daily.     baclofen 10 MG tablet  Commonly known as:  LIORESAL  Take 10 mg by mouth 3 (three) times daily.     divalproex 250 MG 24 hr tablet  Commonly known as:  DEPAKOTE ER  Take 3 tablets  (750 mg total) by mouth every 12 (twelve) hours.     docusate sodium 100 MG capsule  Commonly known as:  COLACE  Take 100 mg by mouth 2 (two) times daily.     enoxaparin 100 MG/ML injection  Commonly known as:  LOVENOX  Inject 1 mL (100 mg total) into the skin every 12 (twelve) hours.     levothyroxine 50 MCG tablet  Commonly known as:  SYNTHROID, LEVOTHROID  Take 50 mcg by mouth daily before breakfast.     pantoprazole 40 MG tablet  Commonly known as:  PROTONIX  Take 40 mg by mouth daily.     promethazine 12.5 MG tablet  Commonly known as:  PHENERGAN  Take 1 tablet (12.5 mg total) by mouth every 6 (six) hours as needed for nausea or vomiting.  propranolol 20 MG tablet  Commonly known as:  INDERAL  Take 20 mg by mouth 3 (three) times daily.     sennosides-docusate sodium 8.6-50 MG tablet  Commonly known as:  SENOKOT-S  Take 2 tablets by mouth 2 (two) times daily.     tamsulosin 0.4 MG Caps capsule  Commonly known as:  FLOMAX  Take 0.4 mg by mouth daily after breakfast.     warfarin 10 MG tablet  Commonly known as:  COUMADIN  Take 1 tablet (10 mg total) by mouth daily.       No Known Allergies Follow-up Information   Follow up with DAUB, STEVE A, MD In 1 week.   Specialty:  Family Medicine   Contact information:   [place] [address] [phone]        The results of significant diagnostics from this hospitalization (including imaging, microbiology, ancillary and laboratory) are listed below for reference.    Significant Diagnostic Studies: Ct Head Wo Contrast  07/25/2013   CLINICAL DATA:  Seizures.  Left-sided weakness.  EXAM: CT HEAD WITHOUT CONTRAST  TECHNIQUE: Contiguous axial images were obtained from the base of the skull through the vertex without intravenous contrast.  COMPARISON:  CT HEAD W/O CM dated 09/06/2011  FINDINGS: Mild cerebral atrophy, similar to prior study. No acute intracranial abnormality. Specifically, no hemorrhage,  hydrocephalus, mass lesion, acute infarction, or significant intracranial injury. No acute calvarial abnormality. Visualized paranasal sinuses and mastoids clear. Orbital soft tissues unremarkable.  IMPRESSION: No acute intracranial abnormality.   Electronically Signed   By: Rolm Baptise M.D.   On: 07/25/2013 21:14   Mr Brain Wo Contrast  07/27/2013   CLINICAL DATA:  Seizures. History of renal cell carcinoma, hypertension and hyperlipidemia.  EXAM: MRI HEAD WITHOUT CONTRAST  TECHNIQUE: Multiplanar, multiecho pulse sequences of the brain and surrounding structures were obtained without intravenous contrast.  COMPARISON:  07/25/2013 CT.  No comparison MR.  FINDINGS: No acute infarct.  No intracranial hemorrhage.  No evidence of mesial temporal sclerosis.  No intracranial mass lesion noted on this unenhanced exam.  Global atrophy without hydrocephalus  Major intracranial vascular structures are patent.  Partial empty sella incidentally noted.  Minimal heterogeneity of bone marrow without bony destructive lesion. Cervical medullary junction, pineal region and orbital structures unremarkable.  Right parotid 7 mm lesion (series 13 image 16) may be a lymph node but is incompletely assessed.  IMPRESSION: No acute infarct.  No evidence of mesial temporal sclerosis.  No intracranial mass lesion noted on this unenhanced exam.  Global atrophy without hydrocephalus  Right parotid 7 mm lesion (series 13 image 16) may be a lymph node but is incompletely assessed.   Electronically Signed   By: Chauncey Cruel M.D.   On: 07/27/2013 19:21   Dg Chest Portable 1 View  07/25/2013   CLINICAL DATA:  Seizures.  EXAM: PORTABLE CHEST - 1 VIEW  COMPARISON:  DG CHEST 2V dated 04/20/2013; DG ABDOMEN ACUTE 2V W/ 1V CHEST dated 07/05/2012; DG CHEST 2V dated 04/03/2012; DG CHEST 1V PORT dated 04/14/2008  FINDINGS: Lung volumes are lower than on prior. Apical lordotic projection. Monitoring leads project over the chest. The cardiopericardial  silhouette appears within normal limits. No airspace disease. Mild left basilar atelectasis. Mediastinal contours are within normal limits allowing for volumes of inspiration.  IMPRESSION: No active disease.   Electronically Signed   By: Dereck Ligas M.D.   On: 07/25/2013 21:19    Microbiology: Recent Results (from the past  240 hour(s))  CULTURE, BLOOD (ROUTINE X 2)     Status: None   Collection Time    07/25/13  8:58 PM      Result Value Ref Range Status   Specimen Description BLOOD LEFT HAND   Final   Special Requests BOTTLES DRAWN AEROBIC AND ANAEROBIC [facility] EACH   Final   Culture  Setup Time     Final   Value: 07/26/2013 04:12     Performed at [facility]   Culture     Final   Value: NO GROWTH 5 DAYS     Performed at [facility]   Report Status 08/01/2013 FINAL   Final  CULTURE, BLOOD (ROUTINE X 2)     Status: None   Collection Time    07/25/13  9:12 PM      Result Value Ref Range Status   Specimen Description BLOOD RIGHT ARM   Final   Special Requests BOTTLES DRAWN AEROBIC ONLY 10CC   Final   Culture  Setup Time     Final   Value: 07/26/2013 04:12     Performed at [facility]   Culture     Final   Value: NO GROWTH 5 DAYS     Performed at [facility]   Report Status 08/01/2013 FINAL   Final  MRSA PCR SCREENING     Status: None   Collection Time    07/26/13  1:41 AM      Result Value Ref Range Status   MRSA by PCR NEGATIVE  NEGATIVE Final   Comment:            The GeneXpert MRSA Assay (FDA     approved for NASAL specimens     only), is one component of a     comprehensive MRSA colonization     surveillance program. It is not     intended to diagnose MRSA     infection nor to guide or     monitor treatment for     MRSA infections.     Labs: Basic Metabolic Panel:  Recent Labs Lab 07/27/13 0252 07/29/13 1640 07/30/13 0601 07/31/13 0320 08/02/13 0734  NA 142 138 140 140 140  K 4.6 4.6 4.9 4.7 4.8  CL 104 101 102 102 103   CO2 _0 GLUCOSE 78 125* 100* 104* 93  BUN 25* 26* 25* 24* 22  CREATININE 2.16* 2.03* 2.06* 1.94* 1.92*  CALCIUM 8.9 8.6 8.9 8.9 9.5   Liver Function Tests: No results found for this basename: AST, ALT, ALKPHOS, BILITOT, PROT, ALBUMIN,  in the last 168 hours No results found for this basename: LIPASE, AMYLASE,  in the last 168 hours No results found for this basename: AMMONIA,  in the last 168 hours CBC:  Recent Labs Lab 07/27/13 0252 07/30/13 0601 07/31/13 0320 08/02/13 0734  WBC 4.3 4.4 4.4 4.3  HGB 9.1* 9.6* 9.5* 10.7*  HCT 28.7* 29.9* 29.3* 34.1*  MCV 98.3 98.4 98.3 98.0  PLT 289 297 283 219   Cardiac Enzymes: No results found for this basename: CKTOTAL, CKMB, CKMBINDEX, TROPONINI,  in the last 168 hours BNP: BNP (last 3 results) No results found for this basename: PROBNP,  in the last 8760 hours CBG: No results found for this basename: GLUCAP,  in the last 168 hours     Signed:  Eulogio Bear  [facility] 08/02/2013, 10:41 AM

## 2013-08-02 NOTE — Clinical Social Work Placement (Addendum)
Clinical Social Work Department CLINICAL SOCIAL WORK PLACEMENT NOTE 08/02/2013  Patient:  KAZ, AULD  Account Number:  [id number] Admit date:  07/25/2013  Clinical Social Worker:  Romaldo Saville Givens, LCSW  Date/time:  08/02/2013 03:39 AM  Clinical Social Work is seeking post-discharge placement for this patient at the following level of care:   SKILLED NURSING   (*CSW will update this form in Epic as items are completed)   07/26/2013  Patient/family provided with [place] Department of Clinical Social Work's list of facilities offering this level of care within the geographic area requested by the patient (or if unable, by the patient's family).  07/26/2013  Patient/family informed of their freedom to choose among providers that offer the needed level of care, that participate in Medicare, Medicaid or managed care program needed by the patient, have an available bed and are willing to accept the patient.    Patient/family informed of [facility]' ownership interest in [facility], as well as of the fact that they are under no obligation to receive care at this facility.  PASARR submitted to EDS on 07/14/2013 PASARR number received from EDS on 07/14/2013  FL2 transmitted to all facilities in geographic area requested by pt/family on  07/29/2013 FL2 transmitted to all facilities within larger geographic area on   Patient informed that his/her managed care company has contracts with or will negotiate with  certain facilities, including the following:     Patient/family informed of bed offers received:  07/30/2013 Patient chooses bed at [facility] Physician recommends and patient chooses bed at    Patient to be transferred to [facility] on 08/03/13  Patient to be transferred to facility by ambulance  The following physician request were entered in Epic:  Additional Comments: 08/02/13 - Patient not accepted by inpatient rehab.

## 2013-08-03 LAB — PROTIME-INR
INR: 1.89 — AB (ref 0.00–1.49)
Prothrombin Time: 21.1 seconds — ABNORMAL HIGH (ref 11.6–15.2)

## 2013-08-03 MED ORDER — WARFARIN SODIUM 5 MG PO TABS
5.0000 mg | ORAL_TABLET | Freq: Once | ORAL | Status: AC
  Administered 2013-08-03: 5 mg via ORAL
  Filled 2013-08-03: qty 1

## 2013-08-03 NOTE — Progress Notes (Signed)
ANTICOAGULATION CONSULT NOTE - Follow Up Consult  Pharmacy Consult for Lovenox / Coumadin Indication: DVT  No Known Allergies  Patient Measurements: Height: 6\' 1"  (185.4 cm) Weight: 224 lb 13.9 oz (102 kg) IBW/kg (Calculated) : 79.9  Vital Signs: Temp: 97.7 F (36.5 C) (02/17 0602) Temp src: Oral (02/17 0602) BP: 119/60 mmHg (02/17 0602) Pulse Rate: 64 (02/17 0602)  Labs:  Recent Labs  08/01/13 0605 08/02/13 0734 08/03/13 0345  HGB  --  10.7*  --   HCT  --  34.1*  --   PLT  --  219  --   LABPROT 14.7 16.5* 21.1*  INR 1.17 1.37 1.89*  CREATININE  --  1.92*  --    Estimated Creatinine Clearance: 50 ml/min (by C-G formula based on Cr of 1.92).  Assessment: 59 yom continues on therapeutic lovenox and Coumadin for treatment of new bilateral DVT. Day 5/5 of overlap INR today = 1.89 CBC stable  Goal of Therapy:  Anti-Xa level 0.6-1 units/ml 4hrs after LMWH dose given Monitor platelets by anticoagulation protocol: Yes   Plan:  1. Continue lovenox 100mg  SQ Q12H 2. F/u CBC Q72H, renal fxn and S&S of bleeding 3. Warfarin 5 mg po x 1 dose tonight  Thank you. Anette Guarneri, PharmD [phone]  08/03/2013 9:25 AM

## 2013-08-03 NOTE — Progress Notes (Signed)
Wife informed of transfer to [facility].

## 2013-08-03 NOTE — Progress Notes (Signed)
Patient has been refusing to pick SNF choices.  [name] he was going to call his insurance company today.  Has again lost his list of SNF that said yes.  Refusing to go by ambulance to SNF.  Patient had no medical issues overnight.  VSS  Isaac Bear DO

## 2013-08-03 NOTE — Progress Notes (Signed)
Report called to [facility].No questions at this time.NT to gather all belongings.SW notified of report given.

## 2013-08-04 ENCOUNTER — Telehealth: Payer: Self-pay | Admitting: Radiology

## 2013-08-04 NOTE — Telephone Encounter (Signed)
Patient was told he should d/c Lithium due to decreased kidney functions. Patient can not get in contact with Dr Lorrene Reid at [facility] concerning this, he has been in touch with Dr Casimiro Needle, but Dr Casimiro Needle needs more information, requesting input from Dr Lorrene Reid, what can we do to facilitate this with Dr Lorrene Reid, should we make referral, call over there and discuss, or other? Please advise, I can call patient. His # [phone]

## 2013-08-04 NOTE — Telephone Encounter (Signed)
Patient is currently in [facility]. Dr. Lorrene Reid and Dr. Casimiro Needle can have access to his chart and make a decision regarding his lithium. I would advise [name] to call Dr. Karen Chafe office and relayed this information to him

## 2013-08-05 NOTE — Telephone Encounter (Signed)
Thank you patient has done this already. Dr Casimiro Needle states he needs to speak to Dr Lorrene Reid. I will call patient to let him know.

## 2013-08-09 NOTE — Telephone Encounter (Signed)
Dr Everlene Farrier has spoken to patient, and we are all on same page.

## 2013-09-27 ENCOUNTER — Telehealth: Payer: Self-pay

## 2013-09-27 DIAGNOSIS — Z7901 Long term (current) use of anticoagulants: Principal | ICD-10-CM

## 2013-09-27 DIAGNOSIS — Z5181 Encounter for therapeutic drug level monitoring: Secondary | ICD-10-CM

## 2013-09-27 DIAGNOSIS — Z4889 Encounter for other specified surgical aftercare: Secondary | ICD-10-CM

## 2013-09-27 NOTE — Telephone Encounter (Signed)
PT STATES HE IS HOME NOW FROM HAVING REHAB, WOULD LIKE TO KNOW IF DR Stevenson FOR HIM [place] [phone]

## 2013-09-27 NOTE — Telephone Encounter (Signed)
Pt txtd Dr Everlene Farrier and asked how he goes about checking his coumadin level. Called pt back per Dr Everlene Farrier and advised that we do not manage coumadin, but can refer him to a coumadin clinic. Butch Penny advised that referral would have to be put in for cardiologist referral and then they would start to manage coumadin once est there as pt. Pt asked if [facility] can do testing while they are seeing pt. I will put in referral to cardiologist for long term. Asked Referrals to add note to check PT/INRs when they see pt and/or let me know if a different order needs to be put in. Dr Everlene Farrier, Juluis Rainier. Is it OK for [facility] to check PT/INRs until pt sees cardiologist - [place] will be calling you with results and instr's?

## 2013-09-27 NOTE — Telephone Encounter (Signed)
Dr Everlene Farrier received text from pt stating that he is home from rehab and has a wound that still needs daily packing. Requests referral for [facility]. Dr Everlene Farrier asked me to put in order for [facility] RN for wound care.

## 2013-09-27 NOTE — Telephone Encounter (Signed)
Notified pt referral order was started. See notes under other phone message. LMOM for Referrals to please work on this soon as wound care is needed daily.

## 2013-09-27 NOTE — Telephone Encounter (Signed)
If home health will do the PT/INR I will follow it until he is able to go to the cardiologist.

## 2013-09-28 NOTE — Telephone Encounter (Signed)
Patient is wanting someone to come and dress his wounds.  [phone]

## 2013-09-28 NOTE — Telephone Encounter (Signed)
Called pt back and advised I check w/Referrals dept and they have sent in orders for [facility] both for wound care and PT/INRs, and also sent order for cardiologist referral. Pt stated he will probably come in tomorrow morn to see Dr Everlene Farrier to go over meds. Asked if we can do wound care then if [facility] hasn't come. I advised that we can check wound. Asked pt if wound dressing has been changed yesterday or today and he stated "no, they really don't know what to do with it". I advised pt that he needs to contact surgeon's office and ask them what they advise until [facility] gets started. Advised pt that we can see him this evening to check wound if surgeon can not help, but that if dressing is to be changed daily, someone needs to check/change it. Pt agreed.

## 2013-09-29 ENCOUNTER — Encounter: Payer: Self-pay | Admitting: Cardiology

## 2013-09-29 ENCOUNTER — Ambulatory Visit (INDEPENDENT_AMBULATORY_CARE_PROVIDER_SITE_OTHER): Payer: 59 | Admitting: Cardiology

## 2013-09-29 ENCOUNTER — Telehealth: Payer: Self-pay

## 2013-09-29 ENCOUNTER — Ambulatory Visit (INDEPENDENT_AMBULATORY_CARE_PROVIDER_SITE_OTHER): Payer: 59 | Admitting: *Deleted

## 2013-09-29 ENCOUNTER — Ambulatory Visit (INDEPENDENT_AMBULATORY_CARE_PROVIDER_SITE_OTHER): Payer: 59 | Admitting: Emergency Medicine

## 2013-09-29 ENCOUNTER — Encounter: Payer: Self-pay | Admitting: *Deleted

## 2013-09-29 VITALS — BP 112/58 | HR 56 | Ht 73.0 in | Wt 208.0 lb

## 2013-09-29 VITALS — BP 110/70 | HR 72 | Temp 97.6°F | Resp 16 | Ht 73.0 in | Wt 208.2 lb

## 2013-09-29 DIAGNOSIS — Z5181 Encounter for therapeutic drug level monitoring: Secondary | ICD-10-CM

## 2013-09-29 DIAGNOSIS — N183 Chronic kidney disease, stage 3 unspecified: Secondary | ICD-10-CM

## 2013-09-29 DIAGNOSIS — Z85528 Personal history of other malignant neoplasm of kidney: Secondary | ICD-10-CM

## 2013-09-29 DIAGNOSIS — Z7901 Long term (current) use of anticoagulants: Secondary | ICD-10-CM | POA: Insufficient documentation

## 2013-09-29 DIAGNOSIS — M25532 Pain in left wrist: Secondary | ICD-10-CM

## 2013-09-29 DIAGNOSIS — M79672 Pain in left foot: Secondary | ICD-10-CM

## 2013-09-29 DIAGNOSIS — R0989 Other specified symptoms and signs involving the circulatory and respiratory systems: Principal | ICD-10-CM

## 2013-09-29 DIAGNOSIS — M25539 Pain in unspecified wrist: Secondary | ICD-10-CM

## 2013-09-29 DIAGNOSIS — M79609 Pain in unspecified limb: Secondary | ICD-10-CM

## 2013-09-29 DIAGNOSIS — R0609 Other forms of dyspnea: Secondary | ICD-10-CM

## 2013-09-29 DIAGNOSIS — N289 Disorder of kidney and ureter, unspecified: Secondary | ICD-10-CM

## 2013-09-29 DIAGNOSIS — E039 Hypothyroidism, unspecified: Secondary | ICD-10-CM

## 2013-09-29 DIAGNOSIS — I82409 Acute embolism and thrombosis of unspecified deep veins of unspecified lower extremity: Secondary | ICD-10-CM

## 2013-09-29 DIAGNOSIS — S31109A Unspecified open wound of abdominal wall, unspecified quadrant without penetration into peritoneal cavity, initial encounter: Secondary | ICD-10-CM

## 2013-09-29 DIAGNOSIS — I82403 Acute embolism and thrombosis of unspecified deep veins of lower extremity, bilateral: Secondary | ICD-10-CM

## 2013-09-29 DIAGNOSIS — E785 Hyperlipidemia, unspecified: Secondary | ICD-10-CM

## 2013-09-29 LAB — BASIC METABOLIC PANEL
BUN: 30 mg/dL — ABNORMAL HIGH (ref 6–23)
CALCIUM: 9.2 mg/dL (ref 8.4–10.5)
CO2: 29 mEq/L (ref 19–32)
Chloride: 103 mEq/L (ref 96–112)
Creat: 2.16 mg/dL — ABNORMAL HIGH (ref 0.50–1.35)
Glucose, Bld: 79 mg/dL (ref 70–99)
Potassium: 5.6 mEq/L — ABNORMAL HIGH (ref 3.5–5.3)
SODIUM: 140 meq/L (ref 135–145)

## 2013-09-29 LAB — POCT CBC
Granulocyte percent: 66.8 %G (ref 37–80)
HCT, POC: 33.8 % — AB (ref 43.5–53.7)
HEMOGLOBIN: 10.1 g/dL — AB (ref 14.1–18.1)
Lymph, poc: 0.9 (ref 0.6–3.4)
MCH: 27.4 pg (ref 27–31.2)
MCHC: 29.9 g/dL — AB (ref 31.8–35.4)
MCV: 91.7 fL (ref 80–97)
MID (cbc): 0.3 (ref 0–0.9)
MPV: 8.2 fL (ref 0–99.8)
POC Granulocyte: 2.5 (ref 2–6.9)
POC LYMPH PERCENT: 24.7 %L (ref 10–50)
POC MID %: 8.5 %M (ref 0–12)
Platelet Count, POC: 252 10*3/uL (ref 142–424)
RBC: 3.69 M/uL — AB (ref 4.69–6.13)
RDW, POC: 18.3 %
WBC: 3.8 10*3/uL — AB (ref 4.6–10.2)

## 2013-09-29 LAB — URIC ACID: Uric Acid, Serum: 8.9 mg/dL — ABNORMAL HIGH (ref 4.0–7.8)

## 2013-09-29 LAB — TSH: TSH: 2.513 u[IU]/mL (ref 0.350–4.500)

## 2013-09-29 LAB — T4, FREE: FREE T4: 1.07 ng/dL (ref 0.80–1.80)

## 2013-09-29 LAB — POCT INR: INR: 1.9

## 2013-09-29 MED ORDER — WARFARIN SODIUM 5 MG PO TABS: ORAL_TABLET | ORAL | Status: DC

## 2013-09-29 MED ORDER — FUROSEMIDE 20 MG PO TABS: 20.0000 mg | ORAL_TABLET | Freq: Every day | ORAL | Status: DC

## 2013-09-29 MED ORDER — POTASSIUM CHLORIDE CRYS ER 10 MEQ PO TBCR: 10.0000 meq | EXTENDED_RELEASE_TABLET | Freq: Every day | ORAL | Status: DC

## 2013-09-29 MED ORDER — AMLODIPINE BESYLATE 5 MG PO TABS: 5.0000 mg | ORAL_TABLET | Freq: Every day | ORAL | Status: AC

## 2013-09-29 NOTE — Patient Instructions (Signed)
Decrease amlodipine to 5mg  daily.   Start lasix (furosemide) 20mg  daily.  Start KCL (potassium) 10 mEq daily.  Your physician recommends that you return for lab work in 2 weeks--BMET.  Your physician has requested that you have an echocardiogram. Echocardiography is a painless test that uses sound waves to create images of your heart. It provides your doctor with information about the size and shape of your heart and how well your heart's chambers and valves are working. This procedure takes approximately one hour. There are no restrictions for this procedure.  Your physician recommends that you schedule a follow-up appointment in: 3 months with Dr Aundra Dubin.

## 2013-09-29 NOTE — Progress Notes (Signed)
Subjective:     Patient ID: [name], male   DOB: 1950/07/10, 63 y.o.   MRN: [id number]  HPI  63 YO caucasian male presents to [facility] for follow up on medications, wound care, and follow up after partial hepatectomy in January. The patient also had DVT's in both legs post surgery for which he now takes Coumadin. He is following up with Cardiology and the coumadin clinic for management of his coumadin and routine INR's. He is also complaining of right hand and foot pain which are chronic and understands we will address these after we take care of 1his wound and medication management. He is still taking medications prescribed by his psychiatrist to manage his bipolar Review of Systems  Constitutional: Positive for activity change and fatigue. Negative for fever, chills, diaphoresis and appetite change.  HENT: Negative for congestion, ear pain, postnasal drip, rhinorrhea, sneezing and sore throat.   Eyes: Negative for pain, redness and itching.  Respiratory: Negative for cough, chest tightness, shortness of breath and wheezing.   Cardiovascular: Negative for chest pain and palpitations.       Significant leg swelling with DVT's in huis left lower extremity post surgery which has improved with compression stockings and coumadin therapy   Gastrointestinal: Positive for abdominal pain. Negative for nausea, vomiting, diarrhea, constipation, blood in stool and abdominal distention.  Endocrine: Positive for cold intolerance. Negative for heat intolerance.  Genitourinary: Positive for difficulty urinating. Negative for dysuria and hematuria.  Musculoskeletal: Positive for arthralgias and myalgias. Negative for joint swelling.  Skin: Negative for rash.  Neurological: Positive for weakness. Negative for dizziness, syncope, light-headedness and headaches.  Psychiatric/Behavioral: Negative for behavioral problems and agitation.   Prior to Admission medications   Medication Sig Start Date End Date Taking?  Authorizing Provider  amLODipine (NORVASC) 10 MG tablet Take 10 mg by mouth daily.   Yes Historical Provider, MD  baclofen (LIORESAL) 10 MG tablet Take 10 mg by mouth 3 (three) times daily.   Yes Historical Provider, MD  Casanthranol-Docusate Sodium (STOOL SOFTENER PLUS PO) Take by mouth 2 (two) times daily.   Yes Historical Provider, MD  divalproex (DEPAKOTE ER) 250 MG 24 hr tablet Take 3 tablets (750 mg total) by mouth every 12 (twelve) hours. 08/02/13  Yes [name], DO  docusate sodium (COLACE) 100 MG capsule Take 100 mg by mouth 2 (two) times daily.   Yes Historical Provider, MD  hydrOXYzine (ATARAX/VISTARIL) 10 MG tablet Take 10 mg by mouth every 6 (six) hours as needed.   Yes Historical Provider, MD  lansoprazole (PREVACID) 30 MG capsule Take 30 mg by mouth daily at 12 noon.   Yes Historical Provider, MD  levothyroxine (SYNTHROID, LEVOTHROID) 50 MCG tablet Take 50 mcg by mouth daily before breakfast.   Yes Historical Provider, MD  lithium carbonate 150 MG capsule Take 150 mg by mouth at bedtime.   Yes Historical Provider, MD  Melatonin 3 MG CAPS Take by mouth.   Yes Historical Provider, MD  promethazine (PHENERGAN) 12.5 MG tablet Take 1 tablet (12.5 mg total) by mouth every 6 (six) hours as needed for nausea or vomiting. 08/02/13  Yes [name], DO  propranolol (INDERAL) 20 MG tablet Take 20 mg by mouth 3 (three) times daily.   Yes Historical Provider, MD  tamsulosin (FLOMAX) 0.4 MG CAPS capsule Take 0.4 mg by mouth daily after breakfast.   Yes Historical Provider, MD  traMADol (ULTRAM) 50 MG tablet Take by mouth every 6 (six) hours as  needed.   Yes Historical Provider, MD  warfarin (COUMADIN) 5 MG tablet Take 5 mg by mouth daily. Take 5 mg on Monday, Thursday and Sunday at dinner   Yes Historical Provider, MD  warfarin (COUMADIN) 7.5 MG tablet Take 7.5 mg by mouth daily. Take 7.5 mg on Tuesday, Wednesday, Friday   Yes Historical Provider, MD  enoxaparin (LOVENOX) 100 MG/ML injection  Inject 1 mL (100 mg total) into the skin every 12 (twelve) hours. 08/02/13   [name], DO  pantoprazole (PROTONIX) 40 MG tablet Take 40 mg by mouth daily.    Historical Provider, MD  sennosides-docusate sodium (SENOKOT-S) 8.6-50 MG tablet Take 2 tablets by mouth 2 (two) times daily.    Historical Provider, MD  warfarin (COUMADIN) 10 MG tablet Take 1 tablet (10 mg total) by mouth daily. 08/02/13   [name], DO        Objective:   Physical Exam  Constitutional: He is oriented to person, place, and time. He appears well-developed and well-nourished. No distress.  HENT:  Head: Normocephalic and atraumatic.  Eyes: Conjunctivae are normal. Pupils are equal, round, and reactive to light.  Cardiovascular: Normal rate, regular rhythm, normal heart sounds and intact distal pulses.  Exam reveals no gallop and no friction rub.   No murmur heard. Pulmonary/Chest: Effort normal and breath sounds normal. No respiratory distress. He has no wheezes. He has no rales. He exhibits no tenderness.  Abdominal: Soft. Bowel sounds are normal. He exhibits no distension. There is tenderness. There is no rebound and no guarding.  Open wound in right lower abdomen at surgical site with purulent drainage  Neurological: He is alert and oriented to person, place, and time.  Skin: Skin is warm and dry. No rash noted. He is not diaphoretic. No erythema.  Psychiatric: He has a normal mood and affect. His behavior is normal.   BP 110/70  Pulse 72  Temp(Src) 97.6 F (36.4 C) (Oral)  Resp 16  Ht 6\' 1"  (1.854 m)  Wt 208 lb 3.2 oz (94.439 kg)  BMI 27.47 kg/m2  SpO2 98%      Assessment:     Left foot pain - Plan: Uric acid  Left wrist pain - Plan: Uric acid  Renal dysfunction - Plan: POCT CBC, Basic metabolic panel  Encounter for monitoring coumadin therapy  Unspecified hypothyroidism - Plan: Basic metabolic panel, TSH, T4, free  History of renal cell carcinoma - Plan: POCT CBC, Basic metabolic  panel  Open wound of abdomen - Plan: Wound culture      Plan:    Continue to have wound care home health every day or two to follow up packing surgical site wound  Given prescription for shower chair  Follow up with cardiology today to measure INR and manage coumadin therapy  After management of wound and coumadin are stable return to clinic or call to get ortho referral for his hand and foot

## 2013-09-29 NOTE — Telephone Encounter (Signed)
They can have a handicap sticker further car but he is not clear to drive. He still is on driving restrictions following his seizure

## 2013-09-29 NOTE — Telephone Encounter (Signed)
Paperwork is in Dr. Everlene Farrier box for signature. Please notify pt when completed.

## 2013-09-29 NOTE — Patient Instructions (Signed)

## 2013-09-29 NOTE — Telephone Encounter (Signed)
Patient states he was seen this morning and forgot to ask the Dr. About possibly receiving a handicap sticker for driving. Please return call when you can. Thank you.

## 2013-09-29 NOTE — Patient Instructions (Signed)
Follow up with cardiology regarding coumadin therapy  Home health for wound care and packing  Will call with lab results  Return to see DR. Daub in 2 weeks

## 2013-09-29 NOTE — Telephone Encounter (Signed)
Is this ok? I will get paperwork ready to be signed.

## 2013-09-30 ENCOUNTER — Telehealth: Payer: Self-pay

## 2013-09-30 NOTE — Progress Notes (Signed)
Patient ID: [name], male   DOB: 1950/06/27, 63 y.o.   MRN: [id number] PCP: Dr. Everlene Farrier  63 yo with history of bipolar disorder, essential tremor, HTN, CKD, DVT, and hyperlipidemia presents for followup.  He was seen initially in 1/14 because of exertional dyspnea.  He has had renal cell carcinoma with right nephrectomy and now has creatinine around 2 at baseline. I had him get an echocardiogram in 1/14.  This showed EF 65-70% with mild diastolic dysfunction.  A Lexiscan Sestamibi (not treadmill due to joint pain) in 1/14 was normal with no evidence for ischemia or infarction.    In 1/15, he had a metastatic nodule excised from his liver via partial hepatectomy.  He had AKI post-op requiring temporary dialysis.  He was admitted again in 2/15 with partial seizures.  MRI showed no stroke or mass.  He was found to have bilateral DVTs while in the hospital.  He is now on warfarin. He reports increased dyspnea recently.  He is short of breath walking about 100 feet.  He is very SOB walking up steps.  No orthopnea, chest pain, palpitations, lightheadedness. He is concerned about lower extremity swelling.   Labs (5/13): LDL 171, HDL 40 Labs (9/13): TSH normal, K 5.1, creatinine 2.08 Labs (2/14): K 4.4, creatinine 1.92, BNP 8, LDL 116, HDL 36 Labs (2/15): K 4.8, creatinine 1.92 Labs (4/15): HCT 33.8  PMH: 1. OSA: CPAP 2. Renal cell carcinoma: right nephrectomy in 1998, cancer returned and he later had chemotherapy and XRT.  Metastatic with liver involvement.  Partial hepatectomy in 1/15.   3. Allergic rhinitis 4. Bipolar disorder: on lithium. 5. Essential tremor. 6. Hyperlipidemia 7. Low testosterone 8. GERD 9. HTN 10. Hyperlipidemia 11. Low back pain.  12. CKD 13. Asthma as child.  14. Dyspnea: Echo (1/14) with EF 32-44%, grade I diastolic dysfunction.  Lexiscan Sestamibi (1/14) with EF 57%, no ischemia or infarction.  15. DVT: diagnosed 2/15.  16. Partial seizures 2/15  SH: Nonsmoker,  married, on disability now but worked for [employer].   FH: No heart disease.   ROS: All systems reviewed and negative except as per HPI.   Current Outpatient Prescriptions  Medication Sig Dispense Refill  . baclofen (LIORESAL) 10 MG tablet Take 10 mg by mouth 3 (three) times daily.      Sarajane Marek Sodium (STOOL SOFTENER PLUS PO) Take by mouth 2 (two) times daily.      . divalproex (DEPAKOTE ER) 250 MG 24 hr tablet Take 3 tablets (750 mg total) by mouth every 12 (twelve) hours.      . docusate sodium (COLACE) 100 MG capsule Take 100 mg by mouth 2 (two) times daily.      . hydrOXYzine (ATARAX/VISTARIL) 10 MG tablet Take 10 mg by mouth every 6 (six) hours as needed.      . lansoprazole (PREVACID) 30 MG capsule Take 30 mg by mouth daily at 12 noon.      Marland Kitchen levothyroxine (SYNTHROID, LEVOTHROID) 50 MCG tablet Take 50 mcg by mouth daily before breakfast.      . lithium carbonate 150 MG capsule Take 150 mg by mouth at bedtime.      . Melatonin 3 MG CAPS Take by mouth.      . pantoprazole (PROTONIX) 40 MG tablet Take 40 mg by mouth daily.      . promethazine (PHENERGAN) 12.5 MG tablet Take 1 tablet (12.5 mg total) by mouth every 6 (six) hours as needed for nausea or vomiting.  30 tablet  0  . propranolol (INDERAL) 20 MG tablet Take 20 mg by mouth 3 (three) times daily.      . sennosides-docusate sodium (SENOKOT-S) 8.6-50 MG tablet Take 2 tablets by mouth 2 (two) times daily.      . tamsulosin (FLOMAX) 0.4 MG CAPS capsule Take 0.4 mg by mouth daily after breakfast.      . traMADol (ULTRAM) 50 MG tablet Take by mouth every 6 (six) hours as needed.      Marland Kitchen amLODipine (NORVASC) 5 MG tablet Take 1 tablet (5 mg total) by mouth daily.  30 tablet  4  . furosemide (LASIX) 20 MG tablet Take 1 tablet (20 mg total) by mouth daily.  30 tablet  4  . potassium chloride (K-DUR,KLOR-CON) 10 MEQ tablet Take 1 tablet (10 mEq total) by mouth daily.  30 tablet  4  . warfarin (COUMADIN) 5 MG tablet Take as directed  by coumadin clinic  50 tablet  2  . [DISCONTINUED] mirtazapine (REMERON) 30 MG tablet Take by mouth ONE FOURTH tablet at bed time For insomnia.  1 tablet  0  . [DISCONTINUED] pravastatin (PRAVACHOL) 40 MG tablet Take 1 tablet (40 mg total) by mouth daily. To help lower cholesterol.  30 tablet  0   No current facility-administered medications for this visit.    BP 112/58  Pulse 56  Ht 6\' 1"  (1.854 m)  Wt 94.348 kg (208 lb)  BMI 27.45 kg/m2 General: NAD Neck: JVP 7-8 cm, no thyromegaly or thyroid nodule.  Lungs: Clear to auscultation bilaterally with normal respiratory effort. CV: Nondisplaced PMI.  Heart regular S1/S2, no S3/S4, no murmur.  1+ bilateral ankle edema.  No carotid bruit.  Normal pedal pulses.  Abdomen: Soft, nontender, no hepatosplenomegaly, mild distention.  Neurologic: Alert and oriented x 3. Has bilateral hand tremor.  Psych: Normal affect. Extremities: No clubbing or cyanosis.   Assessment/Plan: 1. Exertional dyspnea:  Patient does appear to have some volume overload on exam.  Concern for a component of diastolic CHF.  NYHA class III symptoms.  - Start Lasix 20 mg daily + KCl 10 mEq daily.   - BMET in 2 wks.  - Echocardiogram.  2. CKD: Creatinine has been stable around 2.  He has seen Dr. Lorrene Reid in the past.  3. HTN: BP is on the lower side today.  Given the lower extremity edema, I am going to have him cut amlodipine back to 5 mg daily.  We will call in 2 wks for BP readings (he will monitor at home).  4. DVT: Bilateral DVTs noted in 2/15.  He had operation for partial hepatectomy in 1/15, so event was likely triggerd by post-op state.  This would suggest need for limited 6 month course of anticoagulation. However, he has metastatic renal cell carcinoma so recurrence risk for VTE is higher.  Will need to confer with oncologist about length of anticoagulation.    [name] 09/30/2013

## 2013-09-30 NOTE — Telephone Encounter (Signed)
Patient calling to ask [place] to send records to [place]? Home health services. We originally sent his referral to [facility] and since they didn't accept his insurance they kindly referred him to [place]? They are coming tomorrow at 12:30pm to take care of his wound and change it for him. Please advise  he says he doesn't have the fax number for [place] to fax them the records and [place] was unable to send them records I assume before his appointment. I offered to print them for him to pick up but he says he is unable to drive. Patient wants a call back in the morning when this is completed.

## 2013-09-30 NOTE — Telephone Encounter (Signed)
[name] called back and gave the number of [name] health phone: [phone]) he said to please call tomorrow morning to get the fax number. He was very helpful, I said we would try to get it done as soon as possible.

## 2013-09-30 NOTE — Telephone Encounter (Signed)
Spoke to patient.  Advised that handicap paperwork is ready to be picked up at the front desk of [address].

## 2013-10-01 ENCOUNTER — Ambulatory Visit (INDEPENDENT_AMBULATORY_CARE_PROVIDER_SITE_OTHER): Payer: 59 | Admitting: Cardiology

## 2013-10-01 ENCOUNTER — Telehealth: Payer: Self-pay | Admitting: *Deleted

## 2013-10-01 DIAGNOSIS — Z5181 Encounter for therapeutic drug level monitoring: Secondary | ICD-10-CM

## 2013-10-01 DIAGNOSIS — I82403 Acute embolism and thrombosis of unspecified deep veins of lower extremity, bilateral: Secondary | ICD-10-CM

## 2013-10-01 DIAGNOSIS — I82409 Acute embolism and thrombosis of unspecified deep veins of unspecified lower extremity: Secondary | ICD-10-CM

## 2013-10-01 LAB — POCT INR: INR: 1.8

## 2013-10-01 NOTE — Telephone Encounter (Signed)
I spoke with pt's wife earlier today and she asked if pt should take KCL supplement since K was 5.6 done at Dr Perfecto [place] office 09/29/13. Dr Aundra Dubin recommended pt stop KCL supplement for now. Pt notified and left voice mail for pt's wife. Pt has an order to have repeat BMET in 2 weeks.

## 2013-10-01 NOTE — Telephone Encounter (Signed)
Called [place].  Fax number is [phone].

## 2013-10-01 NOTE — Telephone Encounter (Signed)
Tried to call number listed it was not for [place].  Will research and call again.

## 2013-10-03 ENCOUNTER — Other Ambulatory Visit: Payer: Self-pay | Admitting: Emergency Medicine

## 2013-10-03 LAB — WOUND CULTURE
GRAM STAIN: NONE SEEN
Gram Stain: NONE SEEN

## 2013-10-03 MED ORDER — MUPIROCIN 2 % EX OINT: TOPICAL_OINTMENT | CUTANEOUS | Status: DC

## 2013-10-04 ENCOUNTER — Ambulatory Visit (INDEPENDENT_AMBULATORY_CARE_PROVIDER_SITE_OTHER): Payer: 59 | Admitting: Cardiology

## 2013-10-04 ENCOUNTER — Telehealth: Payer: Self-pay

## 2013-10-04 DIAGNOSIS — Z5181 Encounter for therapeutic drug level monitoring: Secondary | ICD-10-CM

## 2013-10-04 DIAGNOSIS — I82409 Acute embolism and thrombosis of unspecified deep veins of unspecified lower extremity: Secondary | ICD-10-CM

## 2013-10-04 DIAGNOSIS — I82403 Acute embolism and thrombosis of unspecified deep veins of lower extremity, bilateral: Secondary | ICD-10-CM

## 2013-10-04 LAB — POCT INR: INR: 2.9

## 2013-10-04 MED ORDER — PROPRANOLOL HCL 20 MG PO TABS: 20.0000 mg | ORAL_TABLET | Freq: Three times a day (TID) | ORAL | Status: DC

## 2013-10-04 MED ORDER — WARFARIN SODIUM 5 MG PO TABS: ORAL_TABLET | ORAL | Status: DC

## 2013-10-04 MED ORDER — TAMSULOSIN HCL 0.4 MG PO CAPS: 0.4000 mg | ORAL_CAPSULE | Freq: Every day | ORAL | Status: AC

## 2013-10-04 MED ORDER — LANSOPRAZOLE 30 MG PO CPDR: 30.0000 mg | DELAYED_RELEASE_CAPSULE | Freq: Every day | ORAL | Status: AC

## 2013-10-04 NOTE — Telephone Encounter (Signed)
Refills sent in for 1 year.  Pt advised.

## 2013-10-04 NOTE — Telephone Encounter (Signed)
Follow up     Returning a nurses call from this morning

## 2013-10-04 NOTE — Telephone Encounter (Signed)
Spoke with wife

## 2013-10-04 NOTE — Telephone Encounter (Signed)
Following up    Pt's wife called back please call back on.

## 2013-10-04 NOTE — Telephone Encounter (Signed)
Patient called requesting medication refills on Propranolol 20 mg, Lansoprazole 30 mg, Tamsulosin 0.4 mg, and Warfarin 5 mg

## 2013-10-04 NOTE — Telephone Encounter (Signed)
All these medications are okay to refill for one year

## 2013-10-04 NOTE — Telephone Encounter (Signed)
Follow up     Pt was not sure if this office called back. Please call her back.

## 2013-10-04 NOTE — Telephone Encounter (Signed)
LMTCB

## 2013-10-08 ENCOUNTER — Ambulatory Visit (HOSPITAL_COMMUNITY): Payer: 59 | Attending: Cardiology | Admitting: Radiology

## 2013-10-08 ENCOUNTER — Ambulatory Visit (INDEPENDENT_AMBULATORY_CARE_PROVIDER_SITE_OTHER): Payer: 59 | Admitting: Emergency Medicine

## 2013-10-08 ENCOUNTER — Telehealth: Payer: Self-pay | Admitting: *Deleted

## 2013-10-08 ENCOUNTER — Ambulatory Visit (INDEPENDENT_AMBULATORY_CARE_PROVIDER_SITE_OTHER): Payer: 59

## 2013-10-08 ENCOUNTER — Other Ambulatory Visit: Payer: 59

## 2013-10-08 VITALS — BP 130/82 | HR 73 | Temp 98.2°F | Resp 16 | Ht 73.0 in | Wt 203.0 lb

## 2013-10-08 DIAGNOSIS — R0602 Shortness of breath: Secondary | ICD-10-CM

## 2013-10-08 DIAGNOSIS — R0609 Other forms of dyspnea: Secondary | ICD-10-CM | POA: Insufficient documentation

## 2013-10-08 DIAGNOSIS — E079 Disorder of thyroid, unspecified: Secondary | ICD-10-CM

## 2013-10-08 DIAGNOSIS — I82403 Acute embolism and thrombosis of unspecified deep veins of lower extremity, bilateral: Secondary | ICD-10-CM

## 2013-10-08 DIAGNOSIS — I82409 Acute embolism and thrombosis of unspecified deep veins of unspecified lower extremity: Secondary | ICD-10-CM

## 2013-10-08 DIAGNOSIS — F319 Bipolar disorder, unspecified: Secondary | ICD-10-CM

## 2013-10-08 DIAGNOSIS — R0989 Other specified symptoms and signs involving the circulatory and respiratory systems: Principal | ICD-10-CM | POA: Insufficient documentation

## 2013-10-08 DIAGNOSIS — Z5181 Encounter for therapeutic drug level monitoring: Secondary | ICD-10-CM

## 2013-10-08 LAB — POCT CBC
GRANULOCYTE PERCENT: 70.3 % (ref 37–80)
HCT, POC: 36 % — AB (ref 43.5–53.7)
Hemoglobin: 10.8 g/dL — AB (ref 14.1–18.1)
LYMPH, POC: 0.9 (ref 0.6–3.4)
MCH: 27.7 pg (ref 27–31.2)
MCHC: 30 g/dL — AB (ref 31.8–35.4)
MCV: 92.2 fL (ref 80–97)
MID (CBC): 0.3 (ref 0–0.9)
MPV: 8 fL (ref 0–99.8)
PLATELET COUNT, POC: 248 10*3/uL (ref 142–424)
POC Granulocyte: 2.7 (ref 2–6.9)
POC LYMPH %: 22.9 % (ref 10–50)
POC MID %: 6.8 %M (ref 0–12)
RBC: 3.9 M/uL — AB (ref 4.69–6.13)
RDW, POC: 20 %
WBC: 3.8 10*3/uL — AB (ref 4.6–10.2)

## 2013-10-08 LAB — POCT INR: INR: 2.7

## 2013-10-08 NOTE — Progress Notes (Signed)
Echocardiogram performed.  

## 2013-10-08 NOTE — Telephone Encounter (Signed)
Per Dr. Everlene Farrier- Three times a week pt will need wound care     Clean with soap and water. Dry and apply 2x2 sterile packing and dress.   Spoke to Dr. Everlene Farrier- pt is to follow up next week. Pts orders are for 6 visits.

## 2013-10-08 NOTE — Progress Notes (Signed)
Subjective:    Patient ID: [name], male    DOB: 10-13-1950, 63 y.o.   MRN: [id number] This chart was scribed for Arlyss Queen, MD by Rolanda Lundborg, ED Scribe. This patient was seen in room 1 and the patient's care was started at 10:59 AM.  Chief Complaint  Patient presents with  . Follow-up    hospital     HPI HPI Comments: [name] is a 63 y.o. male with a h/o renal cell carcinoma, HTN, thyroid disease, and bipolar disorder who presents to the [facility] for a f/u on a partial hepatectomy in January. He is being followed by Jamal Maes in nephrology.   He reports feeling weak doing stuff around the house. He states he has lost 5 pounds in 10 days and is worried because his PT told him to try not to lose weight. He states he has some whey protein at home that he could start taking.  He reports an increase in depression recently, states probably due to his 3rd time having cancer. He states "I just feel I have a long road ahead of me." He is still taking Lithium prescribed by his psychiatrist.  PCP - [name], MD  Past Medical History  Diagnosis Date  . Renal cell carcinoma     chemo and xrt--lost rt kidney  . Childhood asthma   . Sinusitis   . Cellulitis     after left foot surgery  . Osteoarthritis   . OSA on CPAP     NPSG 05-08-07 AHI 63.1/hr,desat t0 80%/loud snoring  . Bipolar 1 disorder   . Hyperlipemia   . Mental disorder   . Depression   . GERD (gastroesophageal reflux disease)   . Hypertension   . Allergy   . Anxiety   . Cataract   . Neuromuscular disorder   . Osteoporosis   . Thyroid disease    Current Outpatient Prescriptions on File Prior to Visit  Medication Sig Dispense Refill  . amLODipine (NORVASC) 5 MG tablet Take 1 tablet (5 mg total) by mouth daily.  30 tablet  4  . baclofen (LIORESAL) 10 MG tablet Take 10 mg by mouth 3 (three) times daily.      Sarajane Marek Sodium (STOOL SOFTENER PLUS PO) Take by  mouth 2 (two) times daily.      . divalproex (DEPAKOTE ER) 250 MG 24 hr tablet Take 3 tablets (750 mg total) by mouth every 12 (twelve) hours.      . docusate sodium (COLACE) 100 MG capsule Take 100 mg by mouth 2 (two) times daily.      . furosemide (LASIX) 20 MG tablet Take 1 tablet (20 mg total) by mouth daily.  30 tablet  4  . hydrOXYzine (ATARAX/VISTARIL) 10 MG tablet Take 10 mg by mouth every 6 (six) hours as needed.      . lansoprazole (PREVACID) 30 MG capsule Take 1 capsule (30 mg total) by mouth daily at 12 noon.  30 capsule  11  . levothyroxine (SYNTHROID, LEVOTHROID) 50 MCG tablet Take 50 mcg by mouth daily before breakfast.      . lithium carbonate 150 MG capsule Take 150 mg by mouth at bedtime.      . Melatonin 3 MG CAPS Take by mouth.      . mupirocin ointment (BACTROBAN) 2 % Small amount to the wound area twice a day  22 g  3  . pantoprazole (PROTONIX) 40 MG tablet  Take 40 mg by mouth daily.      . promethazine (PHENERGAN) 12.5 MG tablet Take 1 tablet (12.5 mg total) by mouth every 6 (six) hours as needed for nausea or vomiting.  30 tablet  0  . propranolol (INDERAL) 20 MG tablet Take 1 tablet (20 mg total) by mouth 3 (three) times daily.  90 tablet  11  . sennosides-docusate sodium (SENOKOT-S) 8.6-50 MG tablet Take 2 tablets by mouth 2 (two) times daily.      . tamsulosin (FLOMAX) 0.4 MG CAPS capsule Take 1 capsule (0.4 mg total) by mouth daily after breakfast.  30 capsule  11  . traMADol (ULTRAM) 50 MG tablet Take by mouth every 6 (six) hours as needed.      . warfarin (COUMADIN) 5 MG tablet Take as directed by coumadin clinic  50 tablet  11  . [DISCONTINUED] mirtazapine (REMERON) 30 MG tablet Take by mouth ONE FOURTH tablet at bed time For insomnia.  1 tablet  0  . [DISCONTINUED] pravastatin (PRAVACHOL) 40 MG tablet Take 1 tablet (40 mg total) by mouth daily. To help lower cholesterol.  30 tablet  0   No current facility-administered medications on file prior to visit.   No  Known Allergies    Review of Systems  Constitutional: Positive for unexpected weight change. Negative for fever.  Cardiovascular: Negative for leg swelling.  Gastrointestinal: Negative for vomiting.  Skin: Negative for rash.  Neurological: Positive for weakness.       Objective:   Physical Exam CONSTITUTIONAL: Well developed/well nourished HEAD: Normocephalic/atraumatic EYES: EOMI/PERRL ENMT: Mucous membranes moist NECK: supple no meningeal signs SPINE:entire spine nontender CV: S1/S2 noted, no murmurs/rubs/gallops noted LUNGS: Lungs are clear to auscultation bilaterally, no apparent distress ABDOMEN: Large scar across the abdomen. 1x1 cm open area that does not appear to be infected on the right side of abdomen at the scar site. GU:no cva tenderness NEURO: Pt is awake/alert, moves all extremitiesx4 EXTREMITIES: pulses normal, full ROM. Trace edema with bilateral varicosities. No calf tenderness. SKIN: warm, color normal PSYCH: no abnormalities of mood noted. Pt does appear depressed but is cooperative and verbal.   Filed Vitals:   10/08/13 1101  BP: 130/82  Pulse: 73  Temp: 98.2 F (36.8 C)  TempSrc: Oral  Resp: 16  Height: 6\' 1"  (1.854 m)  Weight: 203 lb (92.08 kg)  SpO2: 97%   Wt Readings from Last 3 Encounters:  10/08/13 203 lb (92.08 kg)  09/29/13 208 lb (94.348 kg)  09/29/13 208 lb 3.2 oz (94.439 kg)     Results for orders placed in visit on 10/08/13  POCT CBC      Result Value Ref Range   WBC 3.8 (*) 4.6 - 10.2 K/uL   Lymph, poc 0.9  0.6 - 3.4   POC LYMPH PERCENT 22.9  10 - 50 %L   MID (cbc) 0.3  0 - 0.9   POC MID % 6.8  0 - 12 %M   POC Granulocyte 2.7  2 - 6.9   Granulocyte percent 70.3  37 - 80 %G   RBC 3.90 (*) 4.69 - 6.13 M/uL   Hemoglobin 10.8 (*) 14.1 - 18.1 g/dL   HCT, POC 36.0 (*) 43.5 - 53.7 %   MCV 92.2  80 - 97 fL   MCH, POC 27.7  27 - 31.2 pg   MCHC 30.0 (*) 31.8 - 35.4 g/dL   RDW, POC 20.0     Platelet Count, POC 248  142 -  424 K/uL     MPV 8.0  0 - 99.8 fL       Assessment & Plan:  **Disclaimer: This note was dictated with voice recognition software. Similar sounding words can inadvertently be transcribed and this note may contain transcription errors which may not have been corrected upon publication of note.** March biggest problem today is depression. His wound looks good. His lost weight since being on the diuretics. I will forward a copy to his cardiologist to see if he needs to cut back on his diuretic. I personally performed the services described in this documentation, which was scribed in my presence. The recorded information has been reviewed and is accurate.

## 2013-10-08 NOTE — Telephone Encounter (Signed)
[phone]  Alternate number for [facility] ( need to know if patient is to be added to the roster for the weekend. ) [name], Louie Casa

## 2013-10-08 NOTE — Telephone Encounter (Signed)
Pt is coming in today for wound care follow up.  [name] needs orders for continuing wound care- please call them at [phone] with the orders.

## 2013-10-09 LAB — BASIC METABOLIC PANEL
BUN: 42 mg/dL — ABNORMAL HIGH (ref 6–23)
CALCIUM: 9.1 mg/dL (ref 8.4–10.5)
CO2: 29 mEq/L (ref 19–32)
CREATININE: 2.44 mg/dL — AB (ref 0.50–1.35)
Chloride: 101 mEq/L (ref 96–112)
GLUCOSE: 89 mg/dL (ref 70–99)
Potassium: 5 mEq/L (ref 3.5–5.3)
Sodium: 140 mEq/L (ref 135–145)

## 2013-10-09 LAB — T4, FREE: FREE T4: 1.34 ng/dL (ref 0.80–1.80)

## 2013-10-09 LAB — LITHIUM LEVEL: Lithium Lvl: 0.3 mEq/L — ABNORMAL LOW (ref 0.80–1.40)

## 2013-10-09 LAB — TSH: TSH: 2.1 u[IU]/mL (ref 0.350–4.500)

## 2013-10-09 NOTE — Telephone Encounter (Signed)
Called in orders to [facility].

## 2013-10-11 NOTE — Progress Notes (Signed)
I will forward to Dr. Aundra Dubin who saw him last in clinic.

## 2013-10-12 NOTE — Progress Notes (Signed)
Would have him decrease the lasix to every other day.  Repeat BMET in 2 wks, we can arrange.

## 2013-10-13 ENCOUNTER — Other Ambulatory Visit: Payer: Self-pay

## 2013-10-14 ENCOUNTER — Telehealth: Payer: Self-pay | Admitting: *Deleted

## 2013-10-14 NOTE — Telephone Encounter (Signed)
Wife advised pt can stay off lasix for now. Pt to weigh daily and wife will call if weight goes up 2-3 lbs in 24 hours or 5 lbs in 1 week. Wife states home health nurse is checking BP. She will ask her to log these readings for pt and notify Dr Aundra Dubin of any high or low readings.

## 2013-10-14 NOTE — Telephone Encounter (Signed)
Message copied by Katrine Coho on Thu Oct 14, 2013  9:48 AM ------      Message from: [name]      Created: Wed Oct 13, 2013 10:01 PM       That would be fine.       ----- Message -----         From: Katrine Coho, RN         Sent: 10/13/2013   2:33 PM           To: [name], MD            His wife said pt was here 10/08/13 for CVRR. She said CVRR checked with you and pt was told to stop lasix altogether. His wife said there is a potential interaction between lasix and lithium. She would prefer that he not take lasix because his depression has been worse recently. Should I tell him to stay off lasix and weigh daily?       ----- Message -----         From: Lynann Bologna, RN         Sent: 10/12/2013   8:33 AM           To: [name], MD, Katrine Coho, RN                        ----- Message -----         From: [name], MD         Sent: 10/12/2013  12:31 AM           To: Katrine Coho, RN, [name], MD, #                        ----- Message -----         From: Candee Furbish, MD         Sent: 10/11/2013   2:48 PM           To: [name], MD                        ----- Message -----         From: [name], MD         Sent: 10/08/2013  11:56 AM           To: Candee Furbish, MD            Patient has lost 6 pounds on his dose of Lasix he no longer has edema he needs instructions about his medication. A B met was ordered             ------

## 2013-10-15 ENCOUNTER — Ambulatory Visit (INDEPENDENT_AMBULATORY_CARE_PROVIDER_SITE_OTHER): Payer: 59 | Admitting: Cardiology

## 2013-10-15 DIAGNOSIS — I82403 Acute embolism and thrombosis of unspecified deep veins of lower extremity, bilateral: Secondary | ICD-10-CM

## 2013-10-15 DIAGNOSIS — Z5181 Encounter for therapeutic drug level monitoring: Secondary | ICD-10-CM

## 2013-10-15 DIAGNOSIS — I82409 Acute embolism and thrombosis of unspecified deep veins of unspecified lower extremity: Secondary | ICD-10-CM

## 2013-10-15 LAB — POCT INR: INR: 2

## 2013-10-16 ENCOUNTER — Ambulatory Visit (INDEPENDENT_AMBULATORY_CARE_PROVIDER_SITE_OTHER): Payer: 59 | Admitting: Emergency Medicine

## 2013-10-16 VITALS — BP 119/52 | HR 57 | Temp 97.4°F | Resp 18 | Ht 73.0 in | Wt 204.0 lb

## 2013-10-16 DIAGNOSIS — Z79899 Other long term (current) drug therapy: Secondary | ICD-10-CM

## 2013-10-16 LAB — POCT CBC
Granulocyte percent: 65.3 %G (ref 37–80)
HCT, POC: 35.7 % — AB (ref 43.5–53.7)
Hemoglobin: 11 g/dL — AB (ref 14.1–18.1)
Lymph, poc: 0.9 (ref 0.6–3.4)
MCH, POC: 28.6 pg (ref 27–31.2)
MCHC: 30.8 g/dL — AB (ref 31.8–35.4)
MCV: 92.9 fL (ref 80–97)
MID (cbc): 0.3 (ref 0–0.9)
MPV: 7.8 fL (ref 0–99.8)
PLATELET COUNT, POC: 264 10*3/uL (ref 142–424)
POC GRANULOCYTE: 2.2 (ref 2–6.9)
POC LYMPH %: 25.7 % (ref 10–50)
POC MID %: 9 %M (ref 0–12)
RBC: 3.84 M/uL — AB (ref 4.69–6.13)
RDW, POC: 21.8 %
WBC: 3.4 10*3/uL — AB (ref 4.6–10.2)

## 2013-10-16 LAB — COMPREHENSIVE METABOLIC PANEL
AST: 13 U/L (ref 0–37)
Albumin: 3.7 g/dL (ref 3.5–5.2)
Alkaline Phosphatase: 53 U/L (ref 39–117)
BUN: 34 mg/dL — ABNORMAL HIGH (ref 6–23)
CO2: 27 meq/L (ref 19–32)
Calcium: 9.2 mg/dL (ref 8.4–10.5)
Chloride: 104 mEq/L (ref 96–112)
Creat: 2.26 mg/dL — ABNORMAL HIGH (ref 0.50–1.35)
Glucose, Bld: 126 mg/dL — ABNORMAL HIGH (ref 70–99)
Potassium: 4.6 mEq/L (ref 3.5–5.3)
SODIUM: 140 meq/L (ref 135–145)
TOTAL PROTEIN: 6.8 g/dL (ref 6.0–8.3)
Total Bilirubin: 0.3 mg/dL (ref 0.2–1.2)

## 2013-10-16 LAB — TSH: TSH: 1.471 u[IU]/mL (ref 0.350–4.500)

## 2013-10-16 LAB — LITHIUM LEVEL: Lithium Lvl: 0.3 mEq/L — ABNORMAL LOW (ref 0.80–1.40)

## 2013-10-16 LAB — VALPROIC ACID LEVEL: Valproic Acid Lvl: 75 ug/mL (ref 50.0–100.0)

## 2013-10-16 NOTE — Progress Notes (Signed)
Subjective:  This chart was scribed for Isaac Russian, MD by Mercy Moore, Medial Scribe. This patient was seen in room 12 and the patient's care was started at 9:07 AM.    Patient ID: [name], male    DOB: 16-Jan-1951, 63 y.o.   MRN: [id number]  HPI HPI Comments: [name] is a 63 y.o. male with history of DVTs, renal cell carcinoma and CKD, and extensive medical history who presents to the [facility] requesting blood work.  Patient states that "he took a shower last night and he almost froze to death." Patient apparently desires to restart  Testosterone, but is unable to given his history of DVT.  Patient says that he is still having problem sleeping, but is still seeing a specialist to manage those issues.   Patient states that his quality of life is greatly reduced and appears to very discouraged during evaluation.   Patient Active Problem List   Diagnosis Date Noted  . Long term (current) use of anticoagulants 09/29/2013  . Encounter for therapeutic drug monitoring 09/29/2013  . DVT of lower extremity, bilateral 07/27/2013  . New onset seizure 07/26/2013  . Physical deconditioning 07/26/2013  . Bilateral lower extremity edema 07/26/2013  . Anemia, chronic disease 07/26/2013  . Over weight 12/08/2012  . Hypogonadism male 03/23/2012  . Altered mental status 09/06/2011  . Constipation 09/04/2011  . Bipolar 1 disorder, mixed 08/23/2011  . CKD (chronic kidney disease), stage III 08/23/2011  . METATARSALGIA 11/11/2007  . MALIGNANT NEOPLASM OF KIDNEY EXCEPT PELVIS 06/02/2007  . HYPERLIPIDEMIA 06/02/2007  . OBSTRUCTIVE SLEEP APNEA 06/02/2007  . ASTHMA, CHILDHOOD 06/02/2007  . DYSPNEA ON EXERTION 06/02/2007   Past Medical History  Diagnosis Date  . Renal cell carcinoma     chemo and xrt--lost rt kidney  . Childhood asthma   . Sinusitis   . Cellulitis     after left foot surgery  . Osteoarthritis   . OSA on CPAP     NPSG 05-08-07 AHI  63.1/hr,desat t0 80%/loud snoring  . Bipolar 1 disorder   . Hyperlipemia   . Mental disorder   . Depression   . GERD (gastroesophageal reflux disease)   . Hypertension   . Allergy   . Anxiety   . Cataract   . Neuromuscular disorder   . Osteoporosis   . Thyroid disease    Past Surgical History  Procedure Laterality Date  . Foot surgery      left foot toes,   . Bunionectomy    . 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 Cholecystectomy    . Colon surgery    . Kidney surgery  1998    Rt Kidney Removal    No Known Allergies Prior to Admission medications   Medication Sig Start Date End Date Taking? Authorizing Provider  amLODipine (NORVASC) 5 MG tablet Take 1 tablet (5 mg total) by mouth daily. 09/29/13  Yes [name], MD  Casanthranol-Docusate Sodium (STOOL SOFTENER PLUS PO) Take by mouth 2 (two) times daily.   Yes Historical Provider, MD  divalproex (DEPAKOTE ER) 250 MG 24 hr tablet Take 3 tablets (750 mg total) by mouth every 12 (twelve) hours. 08/02/13  Yes [name], DO  docusate sodium (COLACE) 100 MG capsule Take 100 mg by mouth 2 (two) times daily.   Yes Historical Provider, MD  lansoprazole (PREVACID) 30 MG capsule Take 1 capsule (30 mg total) by mouth daily at  12 noon. 10/04/13  Yes Isaac Russian, MD  levothyroxine (SYNTHROID, LEVOTHROID) 50 MCG tablet Take 50 mcg by mouth daily before breakfast.   Yes Historical Provider, MD  lithium carbonate 150 MG capsule Take 150 mg by mouth at bedtime.   Yes Historical Provider, MD  Melatonin 3 MG CAPS Take by mouth.   Yes Historical Provider, MD  mupirocin ointment (BACTROBAN) 2 % Small amount to the wound area twice a day 10/03/13  Yes Isaac Russian, MD  pantoprazole (PROTONIX) 40 MG tablet Take 40 mg by mouth daily.   Yes Historical Provider, MD  propranolol (INDERAL) 20 MG tablet Take 1 tablet (20 mg total) by mouth 3 (three) times daily. 10/04/13  Yes Isaac Russian, MD  sennosides-docusate sodium (SENOKOT-S) 8.6-50 MG  tablet Take 2 tablets by mouth 2 (two) times daily.   Yes Historical Provider, MD  tamsulosin (FLOMAX) 0.4 MG CAPS capsule Take 1 capsule (0.4 mg total) by mouth daily after breakfast. 10/04/13  Yes Isaac Russian, MD  warfarin (COUMADIN) 5 MG tablet Take as directed by coumadin clinic 10/04/13  Yes Isaac Russian, MD  baclofen (LIORESAL) 10 MG tablet Take 10 mg by mouth 3 (three) times daily.    Historical Provider, MD  hydrOXYzine (ATARAX/VISTARIL) 10 MG tablet Take 10 mg by mouth every 6 (six) hours as needed.    Historical Provider, MD  promethazine (PHENERGAN) 12.5 MG tablet Take 1 tablet (12.5 mg total) by mouth every 6 (six) hours as needed for nausea or vomiting. 08/02/13   [name], DO  traMADol (ULTRAM) 50 MG tablet Take by mouth every 6 (six) hours as needed.    Historical Provider, MD   History   Social History  . Marital Status: Married    Spouse Name: N/A    Number of Children: 0  . Years of Education: N/A   Occupational History  . [employer]-product developer    Social History Main Topics  . Smoking status: Never Smoker   . Smokeless tobacco: Never Used  . Alcohol Use: Yes     Comment: occasional beer  . Drug Use: No  . Sexual Activity: Not Currently   Other Topics Concern  . Not on file   Social History Narrative   Married. Education: [employer].       Review of Systems  Respiratory: Negative for cough, chest tightness and shortness of breath.   Cardiovascular: Negative for chest pain.  Gastrointestinal: Negative for blood in stool.  Neurological: Negative for dizziness and headaches.       Objective:   Physical Exam  CONSTITUTIONAL the patient is very depressed appearing. He is not in any distress HEAD: Normocephalic/atraumatic EYES: EOMI/PERRL ENMT: Mucous membranes moist NECK: supple no meningeal signs SPINE:entire spine nontender CV: S1/S2 noted, no murmurs/rubs/gallops noted LUNGS: Lungs are clear to auscultation bilaterally, no apparent  distress Abd   there is an open wound right lateral incision. This was cleaned with saline followed by packing sterile gauze. ABDOMEN: soft, nontender, no rebound or guarding GU:no cva tenderness NEURO: Pt is awake/alert, moves all extremitiesx4 EXTREMITIES: pulses normal, full ROM SKIN: warm, color normal PSYCH: no abnormalities of mood noted Results for orders placed in visit on 10/16/13  POCT CBC      Result Value Ref Range   WBC 3.4 (*) 4.6 - 10.2 K/uL   Lymph, poc 0.9  0.6 - 3.4   POC LYMPH PERCENT 25.7  10 - 50 %L   MID (cbc) 0.3  0 - 0.9   POC MID % 9.0  0 - 12 %M   POC Granulocyte 2.2  2 - 6.9   Granulocyte percent 65.3  37 - 80 %G   RBC 3.84 (*) 4.69 - 6.13 M/uL   Hemoglobin 11.0 (*) 14.1 - 18.1 g/dL   HCT, POC 35.7 (*) 43.5 - 53.7 %   MCV 92.9  80 - 97 fL   MCH, POC 28.6  27 - 31.2 pg   MCHC 30.8 (*) 31.8 - 35.4 g/dL   RDW, POC 21.8     Platelet Count, POC 264  142 - 424 K/uL   MPV 7.8  0 - 99.8 fL        Assessment & Plan:  Complicated history of metastatic renal cell cancer. He has an open area lateral wound which does not show any signs of surrounding cellulitis. The culture did grow so and they have Bactroban ointment to use. He has a continued cold sensation which I suspect that his not being on testosterone. I told him he could not take this while he is actively being treated for his DVT post surgery. He had a decline in his renal function on his last visit. Depakote and lithium levels were ordered and they will be sent to Dr. Casimiro Needle. Recheck for

## 2013-10-21 ENCOUNTER — Telehealth: Payer: Self-pay

## 2013-10-21 MED ORDER — LEVOTHYROXINE SODIUM 50 MCG PO TABS: 50.0000 ug | ORAL_TABLET | Freq: Every day | ORAL | Status: AC

## 2013-10-21 NOTE — Telephone Encounter (Signed)
Advised pt of note 

## 2013-10-21 NOTE — Telephone Encounter (Signed)
[facility]  I am not aware of Coumadin causing fatigue and feeling cold, but that doesn't mean it's not possible.  Recommend that he follow up with whoever is managing his coumadin if he has concerns about this

## 2013-10-21 NOTE — Telephone Encounter (Signed)
Patient returned called.  He states that he had 20% of liver removed in March. Since then he has been staying fatigued and cold.  He is taking coumadin now and wants to know if this is a related side effect.  In addition he needs [facility]  Please advise.

## 2013-10-21 NOTE — Telephone Encounter (Signed)
Called patient. He stated now is not a good time to talk. He will call back.

## 2013-10-21 NOTE — Telephone Encounter (Signed)
Patient needs a refill on Levothyroxine 60mcg sent to [facility]. Patient also reports feeling fatigued and cold a lot. Please advise  [phone]

## 2013-10-28 ENCOUNTER — Telehealth: Payer: Self-pay | Admitting: Cardiology

## 2013-10-28 DIAGNOSIS — R609 Edema, unspecified: Secondary | ICD-10-CM

## 2013-10-28 NOTE — Telephone Encounter (Signed)
He can take Lasix 20 mg every other day but will need BMET in 1 week.  He has had upper normal K so would hold off on supplement for now.  Normally 6 months warfarin for a post-surgery venous thromboembolism but given his cancer history he needs to consult his oncologist, he may want him to continue for longer.

## 2013-10-28 NOTE — Telephone Encounter (Signed)
New message     Home health nurse comes to see patient for other issues.  They told patient he may need to be on a diuretic because both ankles/feet and legs are swollen. He is on warfarin. Patient said he has clor-con and furosemide pills at home but he does not take them and do not know why.  His wife takes care of his pill box.  What should he do about the swelling?

## 2013-10-28 NOTE — Telephone Encounter (Signed)
Patient called regarding mild swelling to his LE/feet and ankles. Denies pitting edema, just mild-to-moderate swelling at this time. He has been off Lasix and K+, per previous instruction from MD. He states the home health nurse indicates his BP is "okay". Patient weighed 204# on May 2nd and he weights 208# today.  Denies cough but he is experiencing exertional SOB with even light exertion.  He has compression socks but does not always wear them. He can still wear shoes but stated that last night he went to gym and wore compression socks, regular socks and shoes and when he came home his feet "really hurt".  Patient instructed on the wearing of compression socks. He was advised to weigh himself tomorrow AM and then call office back with update on weight and status. Also advised patient that he should elevate legs when he is sitting/reclining. Patient verbalized understanding and agreement for plan of care. Routed to Dr. Aundra Dubin with questions: 1) patient wants to know if he should start taking Lasix/K+ again and 2) how long will he be on Warfarin?

## 2013-10-29 ENCOUNTER — Ambulatory Visit (INDEPENDENT_AMBULATORY_CARE_PROVIDER_SITE_OTHER): Payer: 59 | Admitting: Cardiology

## 2013-10-29 ENCOUNTER — Telehealth: Payer: Self-pay | Admitting: *Deleted

## 2013-10-29 DIAGNOSIS — I82409 Acute embolism and thrombosis of unspecified deep veins of unspecified lower extremity: Secondary | ICD-10-CM

## 2013-10-29 DIAGNOSIS — Z5181 Encounter for therapeutic drug level monitoring: Secondary | ICD-10-CM

## 2013-10-29 DIAGNOSIS — I82403 Acute embolism and thrombosis of unspecified deep veins of lower extremity, bilateral: Secondary | ICD-10-CM

## 2013-10-29 LAB — POCT INR: INR: 2.2

## 2013-10-29 MED ORDER — FUROSEMIDE 20 MG PO TABS: 20.0000 mg | ORAL_TABLET | ORAL | Status: DC

## 2013-10-29 NOTE — Telephone Encounter (Signed)
The pt is advised and he verbalized understanding. Per the pts request his BMET has been ordered and scheduled to be drawn on [facility] at [facility] on [address] (ok per Ebony Hail at that location) and his RX for Lasix 20 mg to be taken every other day sent to [facility] to fill.

## 2013-10-29 NOTE — Telephone Encounter (Signed)
lmtcb

## 2013-10-29 NOTE — Telephone Encounter (Signed)
[name] from [facility] called to report wound has healed to the point home care is no longer necessary. Pt has been instructed to apply a dry guaze dressing after showering.

## 2013-11-06 ENCOUNTER — Ambulatory Visit (INDEPENDENT_AMBULATORY_CARE_PROVIDER_SITE_OTHER): Payer: 59 | Admitting: Emergency Medicine

## 2013-11-06 VITALS — BP 98/58 | HR 68 | Temp 97.7°F | Ht 73.0 in | Wt 214.8 lb

## 2013-11-06 DIAGNOSIS — Z5181 Encounter for therapeutic drug level monitoring: Secondary | ICD-10-CM

## 2013-11-06 DIAGNOSIS — R569 Unspecified convulsions: Secondary | ICD-10-CM

## 2013-11-06 DIAGNOSIS — G5600 Carpal tunnel syndrome, unspecified upper limb: Secondary | ICD-10-CM

## 2013-11-06 LAB — POCT CBC
Granulocyte percent: 69.7 %G (ref 37–80)
HCT, POC: 36.9 % — AB (ref 43.5–53.7)
HEMOGLOBIN: 11.1 g/dL — AB (ref 14.1–18.1)
LYMPH, POC: 0.8 (ref 0.6–3.4)
MCH: 28.5 pg (ref 27–31.2)
MCHC: 30.1 g/dL — AB (ref 31.8–35.4)
MCV: 94.9 fL (ref 80–97)
MID (cbc): 0.4 (ref 0–0.9)
MPV: 8.1 fL (ref 0–99.8)
POC Granulocyte: 2.9 (ref 2–6.9)
POC LYMPH PERCENT: 20 %L (ref 10–50)
POC MID %: 10.3 %M (ref 0–12)
Platelet Count, POC: 226 10*3/uL (ref 142–424)
RBC: 3.89 M/uL — AB (ref 4.69–6.13)
RDW, POC: 20.8 %
WBC: 4.2 10*3/uL — AB (ref 4.6–10.2)

## 2013-11-06 LAB — LITHIUM LEVEL: LITHIUM LVL: 0.3 meq/L — AB (ref 0.80–1.40)

## 2013-11-06 LAB — BASIC METABOLIC PANEL
BUN: 42 mg/dL — ABNORMAL HIGH (ref 6–23)
CHLORIDE: 101 meq/L (ref 96–112)
CO2: 28 mEq/L (ref 19–32)
CREATININE: 2.52 mg/dL — AB (ref 0.50–1.35)
Calcium: 9.3 mg/dL (ref 8.4–10.5)
Glucose, Bld: 91 mg/dL (ref 70–99)
Potassium: 5.3 mEq/L (ref 3.5–5.3)
SODIUM: 138 meq/L (ref 135–145)

## 2013-11-06 LAB — VALPROIC ACID LEVEL: Valproic Acid Lvl: 72.7 ug/mL (ref 50.0–100.0)

## 2013-11-06 MED ORDER — PROPRANOLOL HCL 10 MG PO TABS: ORAL_TABLET | ORAL | Status: DC

## 2013-11-06 NOTE — Progress Notes (Addendum)
Subjective:    Patient ID: [name], male    DOB: [birth year], 63 y.o.   MRN: [id number] This chart was scribed for Isaac Russian, MD by [name], ED Scribe. This patient was seen in room 01 and the patient's care was started at 2:49 PM.  Chief Complaint  Patient presents with  . follow up- fatigue   HPI [name] is a 63 y.o. male Pt presents to the [facility] for a follow up. He has h/o metastatic renal cell cancer, DVT, and seizure.   He presents with fatigue, stating he feels tired, drained all the time. He reports having been put on Inderal. He states he has trouble walking up stairs and reports he is constantly cold. He denies knowing who put him on Inderal, possibly at [facility] where he had recent procedures. He states his tremors are under control. He reports [name] was helping control his tremors prior to Inderal without side effects. His wife reports he has been put back on Lasix.  He reports carpal tunnel symptoms with his wrists, mainly when playing his guitar.   He states he has been trying to go to the gym, went earlier today.   He has an appointment at [facility] 02/2014 to discuss Warfarin.   PCP - [name], MD  Patient Active Problem List   Diagnosis Date Noted  . Long term (current) use of anticoagulants 09/29/2013  . Encounter for therapeutic drug monitoring 09/29/2013  . DVT of lower extremity, bilateral 07/27/2013  . New onset seizure 07/26/2013  . Physical deconditioning 07/26/2013  . Bilateral lower extremity edema 07/26/2013  . Anemia, chronic disease 07/26/2013  . Over weight 12/08/2012  . Hypogonadism male 03/23/2012  . Altered mental status 09/06/2011  . Constipation 09/04/2011  . Bipolar 1 disorder, mixed 08/23/2011  . CKD (chronic kidney disease), stage III 08/23/2011  . METATARSALGIA 11/11/2007  . MALIGNANT NEOPLASM OF KIDNEY EXCEPT PELVIS 06/02/2007  . HYPERLIPIDEMIA 06/02/2007  . OBSTRUCTIVE SLEEP APNEA 06/02/2007  . ASTHMA, CHILDHOOD  06/02/2007  . DYSPNEA ON EXERTION 06/02/2007   Prior to Admission medications   Medication Sig Start Date End Date Taking? Authorizing Provider  amLODipine (NORVASC) 5 MG tablet Take 1 tablet (5 mg total) by mouth daily. 09/29/13  Yes [name], MD  Casanthranol-Docusate Sodium (STOOL SOFTENER PLUS PO) Take by mouth 2 (two) times daily.   Yes Historical Provider, MD  divalproex (DEPAKOTE ER) 250 MG 24 hr tablet Take 3 tablets (750 mg total) by mouth every 12 (twelve) hours. 08/02/13  Yes [name], DO  furosemide (LASIX) 20 MG tablet Take 1 tablet (20 mg total) by mouth every other day. 10/29/13  Yes [name], MD  lansoprazole (PREVACID) 30 MG capsule Take 1 capsule (30 mg total) by mouth daily at 12 noon. 10/04/13  Yes Isaac Russian, MD  levothyroxine (SYNTHROID, LEVOTHROID) 50 MCG tablet Take 1 tablet (50 mcg total) by mouth daily before breakfast. 10/21/13  Yes [name], PA-C  lithium carbonate 150 MG capsule Take 150 mg by mouth at bedtime.   Yes Historical Provider, MD  Melatonin 3 MG CAPS Take by mouth.   Yes Historical Provider, MD  propranolol (INDERAL) 20 MG tablet Take 1 tablet (20 mg total) by mouth 3 (three) times daily. 10/04/13  Yes Isaac Russian, MD  sennosides-docusate sodium (SENOKOT-S) 8.6-50 MG tablet Take 2 tablets by mouth 2 (two) times daily.   Yes Historical Provider, MD  tamsulosin [facility])  0.4 MG CAPS capsule Take 1 capsule (0.4 mg total) by mouth daily after breakfast. 10/04/13  Yes Isaac Russian, MD  temazepam (RESTORIL) 15 MG capsule Take 15 mg by mouth at bedtime as needed for sleep.   Yes Historical Provider, MD  warfarin (COUMADIN) 5 MG tablet Take as directed by coumadin clinic 10/04/13  Yes Isaac Russian, MD  docusate sodium (COLACE) 100 MG capsule Take 100 mg by mouth 2 (two) times daily.    Historical Provider, MD  hydrOXYzine (ATARAX/VISTARIL) 10 MG tablet Take 10 mg by mouth every 6 (six) hours as needed.    Historical Provider, MD    Review  of Systems  Constitutional: Positive for chills and fatigue. Negative for fever.  Cardiovascular: Positive for leg swelling (bilateral).  Neurological: Negative for tremors.      Objective:   Physical Exam BP 98/58  Pulse 68  Temp(Src) 97.7 F (36.5 C) (Oral)  Ht 6\' 1"  (1.854 m)  Wt 214 lb 12.8 oz (97.433 kg)  BMI 28.35 kg/m2  SpO2 100%  Nursing note and vitals reviewed. Constitutional: He is oriented to person, place, and time. He appears well-developed and well-nourished. No distress.  HENT:  Head: Normocephalic and atraumatic.  Eyes: EOM are normal.  Neck: Neck supple.  Cardiovascular: Normal rate and rhythm.  Pulmonary/Chest: Effort normal. No respiratory distress. Breath sounds normal. No wheezes, rales. Abdominal: Healed incision of upper abdomen.  Musculoskeletal: Normal range of motion. 1+ edema bilateral LE with port hose on.  Neurological: He is alert and oriented to person, place, and time.  Skin: Skin is warm and dry.  Psychiatric: He has a normal mood and affect. His behavior is normal.     Results for orders placed in visit on 11/06/13  POCT CBC      Result Value Ref Range   WBC 4.2 (*) 4.6 - 10.2 K/uL   Lymph, poc 0.8  0.6 - 3.4   POC LYMPH PERCENT 20.0  10 - 50 %L   MID (cbc) 0.4  0 - 0.9   POC MID % 10.3  0 - 12 %M   POC Granulocyte 2.9  2 - 6.9   Granulocyte percent 69.7  37 - 80 %G   RBC 3.89 (*) 4.69 - 6.13 M/uL   Hemoglobin 11.1 (*) 14.1 - 18.1 g/dL   HCT, POC 36.9 (*) 43.5 - 53.7 %   MCV 94.9  80 - 97 fL   MCH, POC 28.5  27 - 31.2 pg   MCHC 30.1 (*) 31.8 - 35.4 g/dL   RDW, POC 20.8     Platelet Count, POC 226  142 - 424 K/uL   MPV 8.1  0 - 99.8 fL   Assessment & Plan:  The patient status complaint is of fatigue and feeling cold all the time. I decreased his Inderal 10 mg twice a day. Hopefully he'll get off this medication. He's been taking it for tremors. He is currently on Lasix for peripheral edema. Some of this is related to his  amlodipine. Referral made to Dr. [name] to evaluate left carpal tunnel syndrome. His incisional site is all healed. I personally performed the services described in this documentation, which was scribed in my presence. The recorded information has been reviewed and is accurate.

## 2013-11-09 ENCOUNTER — Other Ambulatory Visit: Payer: Self-pay | Admitting: *Deleted

## 2013-11-09 DIAGNOSIS — I82409 Acute embolism and thrombosis of unspecified deep veins of unspecified lower extremity: Secondary | ICD-10-CM

## 2013-11-09 DIAGNOSIS — N183 Chronic kidney disease, stage 3 unspecified: Secondary | ICD-10-CM

## 2013-11-12 ENCOUNTER — Ambulatory Visit (INDEPENDENT_AMBULATORY_CARE_PROVIDER_SITE_OTHER): Payer: 59 | Admitting: Pharmacist

## 2013-11-12 DIAGNOSIS — I82403 Acute embolism and thrombosis of unspecified deep veins of lower extremity, bilateral: Secondary | ICD-10-CM

## 2013-11-12 DIAGNOSIS — Z5181 Encounter for therapeutic drug level monitoring: Secondary | ICD-10-CM

## 2013-11-12 DIAGNOSIS — I82409 Acute embolism and thrombosis of unspecified deep veins of unspecified lower extremity: Secondary | ICD-10-CM

## 2013-11-12 LAB — POCT INR: INR: 1.5

## 2013-11-13 ENCOUNTER — Ambulatory Visit (INDEPENDENT_AMBULATORY_CARE_PROVIDER_SITE_OTHER): Payer: 59 | Admitting: Emergency Medicine

## 2013-11-13 VITALS — BP 104/60 | HR 62 | Temp 97.4°F | Resp 20 | Ht 73.0 in | Wt 212.0 lb

## 2013-11-13 DIAGNOSIS — N289 Disorder of kidney and ureter, unspecified: Secondary | ICD-10-CM

## 2013-11-13 NOTE — Progress Notes (Signed)
Subjective:    Patient ID: [name], male    DOB: November 23, 1950, 63 y.o.   MRN: [id number]  HPI Chief Complaint  Patient presents with  . Follow-up    pt not feeling good--feeling very tired, coumadin check was off and pt wanted to be seen by Dr. Everlene Farrier   This chart was scribed for Arlyss Queen, MD by Thea Alken, ED Scribe. This patient was seen in room 13and the patient's care was started at 4:41 PM.  HPI Comments: [name] is a 63 y.o. male with h/o metastatic renal disease, seizures, and bipolar, who presents to the [facility] here for a follow up. Pt reports he feels fatigue. He reports he has been not been feel well. Pt reports he was walking around the track PTA. He reports he probably has not been drinking enough water. Pt reports his tremors are coming back. Pt creatine has been increased. Pt reports he has been feeling frustrated and overwhelmed. Pt reports he is going to be seen at [facility] next week.   Past Medical History  Diagnosis Date  . Renal cell carcinoma     chemo and xrt--lost rt kidney  . Childhood asthma   . Sinusitis   . Cellulitis     after left foot surgery  . Osteoarthritis   . OSA on CPAP     NPSG 05-08-07 AHI 63.1/hr,desat t0 80%/loud snoring  . Bipolar 1 disorder   . Hyperlipemia   . Mental disorder   . Depression   . GERD (gastroesophageal reflux disease)   . Hypertension   . Allergy   . Anxiety   . Cataract   . Neuromuscular disorder   . Osteoporosis   . Thyroid disease    No Known Allergies Prior to Admission medications   Medication Sig Start Date End Date Taking? Authorizing Provider  amLODipine (NORVASC) 5 MG tablet Take 1 tablet (5 mg total) by mouth daily. 09/29/13  Yes [name], MD  Casanthranol-Docusate Sodium (STOOL SOFTENER PLUS PO) Take by mouth 2 (two) times daily.   Yes Historical Provider, MD  divalproex (DEPAKOTE ER) 250 MG 24 hr tablet Take 3 tablets (750 mg total) by mouth every 12 (twelve) hours.  08/02/13  Yes [name], DO  docusate sodium (COLACE) 100 MG capsule Take 100 mg by mouth 2 (two) times daily.   Yes Historical Provider, MD  hydrOXYzine (ATARAX/VISTARIL) 10 MG tablet Take 10 mg by mouth every 6 (six) hours as needed.   Yes Historical Provider, MD  lansoprazole (PREVACID) 30 MG capsule Take 1 capsule (30 mg total) by mouth daily at 12 noon. 10/04/13  Yes [name], MD  levothyroxine (SYNTHROID, LEVOTHROID) 50 MCG tablet Take 1 tablet (50 mcg total) by mouth daily before breakfast. 10/21/13  Yes [name], PA-C  lithium carbonate 150 MG capsule Take 150 mg by mouth at bedtime.   Yes Historical Provider, MD  Melatonin 3 MG CAPS Take by mouth.   Yes Historical Provider, MD  propranolol (INDERAL) 10 MG tablet Take one tablet twice a day 11/06/13  Yes [name], MD  sennosides-docusate sodium (SENOKOT-S) 8.6-50 MG tablet Take 2 tablets by mouth 2 (two) times daily.   Yes Historical Provider, MD  tamsulosin (FLOMAX) 0.4 MG CAPS capsule Take 1 capsule (0.4 mg total) by mouth daily after breakfast. 10/04/13  Yes [name], MD  temazepam (RESTORIL) 15 MG capsule Take 15 mg by mouth at bedtime  as needed for sleep.   Yes Historical Provider, MD  warfarin (COUMADIN) 5 MG tablet Take as directed by coumadin clinic 10/04/13  Yes [name], MD   Review of Systems     Objective:   Physical Exam CONSTITUTIONAL: Well developed/well nourished HEAD: Normocephalic/atraumatic EYES: EOMI/PERRL ENMT: Mucous membranes moist NECK: supple no meningeal signs SPINE:entire spine nontender CV: S1/S2 noted, no murmurs/rubs/gallops noted LUNGS: Lungs are clear to auscultation bilaterally, no apparent distress, large well healed abdominal incision.   ABDOMEN: soft, nontender, no rebound or guarding GU:no cva tenderness NEURO: Pt is awake/alert, moves all extremitiesx4, resting tremor of both hands EXTREMITIES: pulses normal, full ROM SKIN: warm, color normal PSYCH: no abnormalities  of mood noted  Filed Vitals:   11/13/13 1554  BP: 104/60  Pulse: 62  Temp: 97.4 F (36.3 C)  TempSrc: Oral  Resp: 20  Height: 6\' 1"  (1.854 m)  Weight: 212 lb (96.163 kg)  SpO2: 99%       Assessment & Plan:   Examination the patient is unchanged. I did repeat his BUN and creatinine. He is scheduled to see Dr. Corinna Capra on Wednesday. He wants to, off his Inderal and take a different medication for his tremor however with the reduction Inderal dosage his tremor is worse. I personally performed the services described in this documentation, which was scribed in my presence. The recorded information has been reviewed and is accurate.

## 2013-11-14 LAB — BASIC METABOLIC PANEL
BUN: 46 mg/dL — AB (ref 6–23)
CO2: 29 meq/L (ref 19–32)
Calcium: 9.4 mg/dL (ref 8.4–10.5)
Chloride: 100 mEq/L (ref 96–112)
Creat: 2.62 mg/dL — ABNORMAL HIGH (ref 0.50–1.35)
Glucose, Bld: 100 mg/dL — ABNORMAL HIGH (ref 70–99)
POTASSIUM: 5.5 meq/L — AB (ref 3.5–5.3)
Sodium: 137 mEq/L (ref 135–145)

## 2013-11-15 ENCOUNTER — Telehealth: Payer: Self-pay | Admitting: Emergency Medicine

## 2013-11-15 ENCOUNTER — Telehealth: Payer: Self-pay | Admitting: Cardiology

## 2013-11-15 ENCOUNTER — Other Ambulatory Visit: Payer: Self-pay | Admitting: *Deleted

## 2013-11-15 DIAGNOSIS — N179 Acute kidney failure, unspecified: Secondary | ICD-10-CM

## 2013-11-15 NOTE — Telephone Encounter (Signed)
New message ° ° ° ° ° ° ° ° ° °Pt returning nurses call °

## 2013-11-15 NOTE — Telephone Encounter (Signed)
New message     Pt says it is too expensive to come in and have his coumadin checked.  Is there another way he can have it checked cheaper?

## 2013-11-15 NOTE — Telephone Encounter (Signed)
I will be sure all the blood work is sent to Dr. Lorrene Reid so she can make the appointment. I did discuss all of this with Elta Guadeloupe.

## 2013-11-15 NOTE — Telephone Encounter (Signed)
Spoke with pt's wife and confirmed that pt is not taking lasix.

## 2013-11-15 NOTE — Telephone Encounter (Signed)
Patient tells me his PCP won't monitor his coumadin, and home health discharged him.  He needs to be monitored by [place], and needs to make an appointment for later this week.  Appointment made for 11/19/13.

## 2013-11-16 ENCOUNTER — Telehealth: Payer: Self-pay

## 2013-11-16 NOTE — Telephone Encounter (Signed)
PATIENT WOULD LIKE SOMEONE TO LOOK AT HIS CHART TO TELL HIM WHAT HIS DEPAKOTE LEVELS HAVE BEEN FOR THIS YEAR? BEST PHONE [phone] (HOME)   [place]

## 2013-11-17 NOTE — Telephone Encounter (Signed)
Called patient and advised him his depakote level was 72.7 on 10/16/13 which is in the normal range.  He was pleased.

## 2013-11-19 ENCOUNTER — Ambulatory Visit (INDEPENDENT_AMBULATORY_CARE_PROVIDER_SITE_OTHER): Payer: 59 | Admitting: Pharmacist

## 2013-11-19 DIAGNOSIS — I82403 Acute embolism and thrombosis of unspecified deep veins of lower extremity, bilateral: Secondary | ICD-10-CM

## 2013-11-19 DIAGNOSIS — I82409 Acute embolism and thrombosis of unspecified deep veins of unspecified lower extremity: Secondary | ICD-10-CM

## 2013-11-19 DIAGNOSIS — Z5181 Encounter for therapeutic drug level monitoring: Secondary | ICD-10-CM

## 2013-11-19 LAB — POCT INR: INR: 1.6

## 2013-11-19 MED ORDER — WARFARIN SODIUM 5 MG PO TABS: ORAL_TABLET | ORAL | Status: DC

## 2013-11-26 ENCOUNTER — Ambulatory Visit (INDEPENDENT_AMBULATORY_CARE_PROVIDER_SITE_OTHER): Payer: 59 | Admitting: *Deleted

## 2013-11-26 DIAGNOSIS — Z5181 Encounter for therapeutic drug level monitoring: Secondary | ICD-10-CM

## 2013-11-26 DIAGNOSIS — I82403 Acute embolism and thrombosis of unspecified deep veins of lower extremity, bilateral: Secondary | ICD-10-CM

## 2013-11-26 DIAGNOSIS — I82409 Acute embolism and thrombosis of unspecified deep veins of unspecified lower extremity: Secondary | ICD-10-CM

## 2013-11-26 LAB — POCT INR: INR: 2.3

## 2013-12-02 ENCOUNTER — Telehealth: Payer: Self-pay | Admitting: Cardiology

## 2013-12-02 ENCOUNTER — Telehealth: Payer: Self-pay

## 2013-12-02 DIAGNOSIS — R0609 Other forms of dyspnea: Secondary | ICD-10-CM

## 2013-12-02 DIAGNOSIS — R0989 Other specified symptoms and signs involving the circulatory and respiratory systems: Principal | ICD-10-CM

## 2013-12-02 DIAGNOSIS — I82409 Acute embolism and thrombosis of unspecified deep veins of unspecified lower extremity: Secondary | ICD-10-CM

## 2013-12-02 NOTE — Telephone Encounter (Signed)
He can have a BMET and BNP

## 2013-12-02 NOTE — Telephone Encounter (Signed)
Pt has an appt @ [place] tomorrow 4:30, the on-call for Dr. Margot Ables asked to have pt's primary call over there and they would approve some blood work to be done for pt. Pt sees Dr Everlene Farrier, and would like to know if he would be able to do this for him?

## 2013-12-02 NOTE — Telephone Encounter (Signed)
He has most tests already in Epic.Were other tests suggested ??

## 2013-12-02 NOTE — Telephone Encounter (Signed)
Due to chronic fatigue pt would like to have additional blood work completed prior to visit tomorrow. Dr. Inocente Salles office can do the labs also. Need to know which labs to order. Please advise.

## 2013-12-02 NOTE — Telephone Encounter (Signed)
Patient would like additional blood work done. Please call and advise,.

## 2013-12-02 NOTE — Telephone Encounter (Signed)
LMTCB

## 2013-12-02 NOTE — Telephone Encounter (Signed)
Patient's wife called back. She states he has generally not been feeling well with complaints of fatigue and wanted [place] to order lab work on him since he has an appointment with [facility] tomorrow. Advised that we need an MD order for this and Dr.McLean is currently on vacation. She will call Dr.Daub and ask about ordering lab work either in his office or call here with request to be done here. Will let Dr.Mclean know that she called.

## 2013-12-03 ENCOUNTER — Telehealth: Payer: Self-pay

## 2013-12-03 ENCOUNTER — Ambulatory Visit (INDEPENDENT_AMBULATORY_CARE_PROVIDER_SITE_OTHER): Payer: 59 | Admitting: *Deleted

## 2013-12-03 ENCOUNTER — Other Ambulatory Visit (INDEPENDENT_AMBULATORY_CARE_PROVIDER_SITE_OTHER): Payer: 59

## 2013-12-03 DIAGNOSIS — I82409 Acute embolism and thrombosis of unspecified deep veins of unspecified lower extremity: Secondary | ICD-10-CM

## 2013-12-03 DIAGNOSIS — N184 Chronic kidney disease, stage 4 (severe): Secondary | ICD-10-CM

## 2013-12-03 DIAGNOSIS — I82403 Acute embolism and thrombosis of unspecified deep veins of lower extremity, bilateral: Secondary | ICD-10-CM

## 2013-12-03 DIAGNOSIS — Z5181 Encounter for therapeutic drug level monitoring: Secondary | ICD-10-CM

## 2013-12-03 DIAGNOSIS — R0609 Other forms of dyspnea: Secondary | ICD-10-CM

## 2013-12-03 DIAGNOSIS — R0989 Other specified symptoms and signs involving the circulatory and respiratory systems: Principal | ICD-10-CM

## 2013-12-03 LAB — BASIC METABOLIC PANEL
BUN: 37 mg/dL — AB (ref 6–23)
CALCIUM: 9.6 mg/dL (ref 8.4–10.5)
CO2: 28 mEq/L (ref 19–32)
Chloride: 99 mEq/L (ref 96–112)
Creatinine, Ser: 2.7 mg/dL — ABNORMAL HIGH (ref 0.4–1.5)
GFR: 25.66 mL/min — AB (ref >60.00)
Glucose, Bld: 89 mg/dL (ref 70–99)
Potassium: 4.7 mEq/L (ref 3.5–5.1)
Sodium: 135 mEq/L (ref 135–145)

## 2013-12-03 LAB — BRAIN NATRIURETIC PEPTIDE: Pro B Natriuretic peptide (BNP): 24 pg/mL (ref 0.0–100.0)

## 2013-12-03 LAB — POCT INR: INR: 3.3

## 2013-12-03 NOTE — Telephone Encounter (Signed)
PATIENT WOULD LIKE TO KNOW IF DR DAUB CAN OUT AN ORDER IN FOR BLOOD TEST FOR KIDNEY FUNCTION TO BE DONE WHILE PATIENT IS AT THE CARDIOLOGIST?? PATIENT GOES TO THE CARDIOLOGIST TODAY AT 4:30 AND IS NOT FEELING WELL, HOWEVER, DOES NOT WANT TO GO TO TWO DIFFERENT DOCTORS OFFICES TODAY.

## 2013-12-03 NOTE — Telephone Encounter (Signed)
LMTCB for wife. Will order labs to be done today when pt comes for [facility].

## 2013-12-03 NOTE — Telephone Encounter (Signed)
Order placed for CMP. Pt advised.

## 2013-12-06 ENCOUNTER — Telehealth: Payer: Self-pay | Admitting: Cardiology

## 2013-12-06 NOTE — Telephone Encounter (Signed)
Done this morning 

## 2013-12-06 NOTE — Telephone Encounter (Signed)
Pt had lab done 12/03/13

## 2013-12-06 NOTE — Telephone Encounter (Signed)
New message     Please send lab results to Dr Jamal Maes at [facility] and pt is having numbness on the top of his feet.  Please advise

## 2013-12-09 ENCOUNTER — Telehealth: Payer: Self-pay | Admitting: *Deleted

## 2013-12-09 ENCOUNTER — Encounter: Payer: 59 | Attending: Nephrology | Admitting: Dietician

## 2013-12-09 ENCOUNTER — Encounter: Payer: Self-pay | Admitting: Dietician

## 2013-12-09 VITALS — Ht 73.0 in | Wt 208.2 lb

## 2013-12-09 DIAGNOSIS — N183 Chronic kidney disease, stage 3 unspecified: Secondary | ICD-10-CM

## 2013-12-09 DIAGNOSIS — Z85528 Personal history of other malignant neoplasm of kidney: Secondary | ICD-10-CM | POA: Diagnosis not present

## 2013-12-09 DIAGNOSIS — Z905 Acquired absence of kidney: Secondary | ICD-10-CM | POA: Diagnosis not present

## 2013-12-09 DIAGNOSIS — Z713 Dietary counseling and surveillance: Secondary | ICD-10-CM | POA: Insufficient documentation

## 2013-12-09 DIAGNOSIS — N184 Chronic kidney disease, stage 4 (severe): Secondary | ICD-10-CM | POA: Insufficient documentation

## 2013-12-09 DIAGNOSIS — Z8505 Personal history of malignant neoplasm of liver: Secondary | ICD-10-CM | POA: Diagnosis not present

## 2013-12-09 NOTE — Progress Notes (Signed)
  Medical Nutrition Therapy:  Appt start time: 1600 end time:  1700.   Assessment:  Primary concerns today:  Jessup is here today with his wife. Per the patient, his Nephrologist recommended that he limit phosphorus and potassium. Kyndal and his wife report no protein or fluid restriction. Ovadia only has 1 kidney, lost 1 kidney in 1998 to cancer. 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 He has received extensive radiation. He has stage 4 CKD but is not on dialysis. Trinidad is on Coumadin as well.    Preferred Learning Style:  No preference indicated   Learning Readiness:  Contemplating  Ready   MEDICATIONS: see list   DIETARY INTAKE:  24-hr recall:  B ( AM): Rice krispies, skim milk and blueberries  Snk ( AM):   L ( PM): roasted chicken with green beans, sweet potatoes, and rice Snk ( PM):  D ( PM): chicken or fish  Snk ( PM):   Estimated energy needs: 2000 calories  Progress Towards Goal(s):  In progress.   Nutritional Diagnosis:  NB-1.1 Food and nutrition-related knowledge deficit As related to new recommendations to limit phosphorus and potassium.  As evidenced by Stage 4 CKD.    Intervention:  Nutrition counseling provided. Encouraged Ghazi and his wife to request a referral to see a dietitian that specializes in renal diet.  Teaching Method Utilized:  Visual Auditory  Handouts given during visit include:  CKD foodlists  CKD Phosphorus  CKD Potassium  Barriers to learning/adherence to lifestyle change: depression and bipolar  Demonstrated degree of understanding via:  Teach Back   Monitoring/Evaluation:  Dietary intake, exercise, and body weight prn.

## 2013-12-09 NOTE — Telephone Encounter (Signed)
Received a request for renal panel when patient comes to get INR from Dr Lorrene Reid. Scheduled and order in computer. Result to be sent to Dr [facility] Fax [phone]

## 2013-12-09 NOTE — Patient Instructions (Addendum)
-  Try Nepro protein drink -[employer] products (very high in protein) -[email or web address] -Exercise as tolerated  -Check into visiting with an outpatient renal dietitian -Ask about a protein restriction     Foods that are ok to have: -Apples and berries -Peaches -Pears -Grapes -Mushrooms -Eggs -Chicken -Fish -Garlic -Seafood -Corn -Rice -White bread -Rice or corn cereal  -Sherbet and popsicles -White bagel and cream cheese -Croissant -Ginger ale or Sprite -Green beans -Zucchini -Popcorn -Grits -Hamburger on white bun (lettuce, onion, mustard, low fat mayonnaise) -Noodles -Angel food cake or yellow cake -Iceberg salad -Crackers without grains or pretzels -Chicken salad, tuna salad, egg salad, pasta salad with low fat mayonnaise -Cottage cheese -Okra -Cornbread -Tacos on flour tortillas -Almond milk  Foods to avoid! -Chocolate -Dark soda -Salt substitutes -Beans and seeds -Nuts and nut butters

## 2013-12-10 ENCOUNTER — Ambulatory Visit (INDEPENDENT_AMBULATORY_CARE_PROVIDER_SITE_OTHER): Payer: 59 | Admitting: *Deleted

## 2013-12-10 ENCOUNTER — Ambulatory Visit (INDEPENDENT_AMBULATORY_CARE_PROVIDER_SITE_OTHER): Payer: 59 | Admitting: Pharmacist

## 2013-12-10 DIAGNOSIS — I82409 Acute embolism and thrombosis of unspecified deep veins of unspecified lower extremity: Secondary | ICD-10-CM

## 2013-12-10 DIAGNOSIS — I82403 Acute embolism and thrombosis of unspecified deep veins of lower extremity, bilateral: Secondary | ICD-10-CM

## 2013-12-10 DIAGNOSIS — N183 Chronic kidney disease, stage 3 unspecified: Secondary | ICD-10-CM

## 2013-12-10 DIAGNOSIS — Z5181 Encounter for therapeutic drug level monitoring: Secondary | ICD-10-CM

## 2013-12-10 LAB — BASIC METABOLIC PANEL
BUN: 37 mg/dL — AB (ref 6–23)
CALCIUM: 9.5 mg/dL (ref 8.4–10.5)
CO2: 26 mEq/L (ref 19–32)
CREATININE: 2.6 mg/dL — AB (ref 0.50–1.35)
Chloride: 101 mEq/L (ref 96–112)
Glucose, Bld: 94 mg/dL (ref 70–99)
POTASSIUM: 4.7 meq/L (ref 3.5–5.3)
Sodium: 137 mEq/L (ref 135–145)

## 2013-12-10 LAB — POCT INR: INR: 3

## 2013-12-16 ENCOUNTER — Telehealth: Payer: Self-pay | Admitting: Radiology

## 2013-12-16 NOTE — Telephone Encounter (Signed)
Phone call to patient, he needs to call me back regarding a form I am trying to help with.

## 2013-12-20 NOTE — Telephone Encounter (Signed)
Patient called me back.

## 2013-12-24 ENCOUNTER — Ambulatory Visit (INDEPENDENT_AMBULATORY_CARE_PROVIDER_SITE_OTHER): Payer: 59 | Admitting: *Deleted

## 2013-12-24 DIAGNOSIS — I82409 Acute embolism and thrombosis of unspecified deep veins of unspecified lower extremity: Secondary | ICD-10-CM

## 2013-12-24 DIAGNOSIS — I82403 Acute embolism and thrombosis of unspecified deep veins of lower extremity, bilateral: Secondary | ICD-10-CM

## 2013-12-24 DIAGNOSIS — Z5181 Encounter for therapeutic drug level monitoring: Secondary | ICD-10-CM

## 2013-12-24 LAB — POCT INR: INR: 2.1

## 2013-12-29 DIAGNOSIS — Z0271 Encounter for disability determination: Secondary | ICD-10-CM

## 2014-01-03 ENCOUNTER — Encounter (HOSPITAL_COMMUNITY): Payer: Self-pay | Admitting: Emergency Medicine

## 2014-01-03 ENCOUNTER — Emergency Department (HOSPITAL_COMMUNITY): Payer: 59

## 2014-01-03 ENCOUNTER — Inpatient Hospital Stay (HOSPITAL_COMMUNITY): Payer: 59

## 2014-01-03 ENCOUNTER — Inpatient Hospital Stay (HOSPITAL_COMMUNITY)
Admission: EM | Admit: 2014-01-03 | Discharge: 2014-01-15 | DRG: 917 | Disposition: E | Payer: 59 | Attending: Pulmonary Disease | Admitting: Pulmonary Disease

## 2014-01-03 DIAGNOSIS — G9340 Encephalopathy, unspecified: Secondary | ICD-10-CM | POA: Diagnosis present

## 2014-01-03 DIAGNOSIS — K219 Gastro-esophageal reflux disease without esophagitis: Secondary | ICD-10-CM | POA: Diagnosis present

## 2014-01-03 DIAGNOSIS — T438X2A Poisoning by other psychotropic drugs, intentional self-harm, initial encounter: Secondary | ICD-10-CM | POA: Diagnosis present

## 2014-01-03 DIAGNOSIS — N183 Chronic kidney disease, stage 3 unspecified: Secondary | ICD-10-CM | POA: Diagnosis present

## 2014-01-03 DIAGNOSIS — F411 Generalized anxiety disorder: Secondary | ICD-10-CM | POA: Diagnosis present

## 2014-01-03 DIAGNOSIS — J9600 Acute respiratory failure, unspecified whether with hypoxia or hypercapnia: Secondary | ICD-10-CM

## 2014-01-03 DIAGNOSIS — T423X2A Poisoning by barbiturates, intentional self-harm, initial encounter: Secondary | ICD-10-CM | POA: Diagnosis present

## 2014-01-03 DIAGNOSIS — R4020 Unspecified coma: Secondary | ICD-10-CM | POA: Diagnosis present

## 2014-01-03 DIAGNOSIS — Z79899 Other long term (current) drug therapy: Secondary | ICD-10-CM

## 2014-01-03 DIAGNOSIS — I469 Cardiac arrest, cause unspecified: Secondary | ICD-10-CM | POA: Diagnosis present

## 2014-01-03 DIAGNOSIS — E785 Hyperlipidemia, unspecified: Secondary | ICD-10-CM | POA: Diagnosis present

## 2014-01-03 DIAGNOSIS — T43502A Poisoning by unspecified antipsychotics and neuroleptics, intentional self-harm, initial encounter: Secondary | ICD-10-CM | POA: Diagnosis present

## 2014-01-03 DIAGNOSIS — C649 Malignant neoplasm of unspecified kidney, except renal pelvis: Secondary | ICD-10-CM | POA: Diagnosis present

## 2014-01-03 DIAGNOSIS — Z905 Acquired absence of kidney: Secondary | ICD-10-CM

## 2014-01-03 DIAGNOSIS — I5032 Chronic diastolic (congestive) heart failure: Secondary | ICD-10-CM | POA: Diagnosis present

## 2014-01-03 DIAGNOSIS — N189 Chronic kidney disease, unspecified: Secondary | ICD-10-CM | POA: Diagnosis present

## 2014-01-03 DIAGNOSIS — X838XXA Intentional self-harm by other specified means, initial encounter: Secondary | ICD-10-CM

## 2014-01-03 DIAGNOSIS — Z66 Do not resuscitate: Secondary | ICD-10-CM | POA: Diagnosis not present

## 2014-01-03 DIAGNOSIS — I959 Hypotension, unspecified: Secondary | ICD-10-CM | POA: Diagnosis present

## 2014-01-03 DIAGNOSIS — G931 Anoxic brain damage, not elsewhere classified: Secondary | ICD-10-CM | POA: Diagnosis present

## 2014-01-03 DIAGNOSIS — E039 Hypothyroidism, unspecified: Secondary | ICD-10-CM | POA: Diagnosis present

## 2014-01-03 DIAGNOSIS — T43501A Poisoning by unspecified antipsychotics and neuroleptics, accidental (unintentional), initial encounter: Secondary | ICD-10-CM | POA: Diagnosis present

## 2014-01-03 DIAGNOSIS — T423X4A Poisoning by barbiturates, undetermined, initial encounter: Principal | ICD-10-CM | POA: Diagnosis present

## 2014-01-03 DIAGNOSIS — Z85528 Personal history of other malignant neoplasm of kidney: Secondary | ICD-10-CM | POA: Diagnosis not present

## 2014-01-03 DIAGNOSIS — I498 Other specified cardiac arrhythmias: Secondary | ICD-10-CM | POA: Diagnosis present

## 2014-01-03 DIAGNOSIS — K7200 Acute and subacute hepatic failure without coma: Secondary | ICD-10-CM | POA: Diagnosis present

## 2014-01-03 DIAGNOSIS — I82509 Chronic embolism and thrombosis of unspecified deep veins of unspecified lower extremity: Secondary | ICD-10-CM | POA: Diagnosis present

## 2014-01-03 DIAGNOSIS — T45515A Adverse effect of anticoagulants, initial encounter: Secondary | ICD-10-CM | POA: Diagnosis present

## 2014-01-03 DIAGNOSIS — F319 Bipolar disorder, unspecified: Secondary | ICD-10-CM | POA: Diagnosis present

## 2014-01-03 DIAGNOSIS — Y92009 Unspecified place in unspecified non-institutional (private) residence as the place of occurrence of the external cause: Secondary | ICD-10-CM | POA: Diagnosis not present

## 2014-01-03 DIAGNOSIS — T424X4A Poisoning by benzodiazepines, undetermined, initial encounter: Secondary | ICD-10-CM | POA: Diagnosis present

## 2014-01-03 DIAGNOSIS — Z515 Encounter for palliative care: Secondary | ICD-10-CM | POA: Diagnosis not present

## 2014-01-03 DIAGNOSIS — N179 Acute kidney failure, unspecified: Secondary | ICD-10-CM | POA: Diagnosis present

## 2014-01-03 DIAGNOSIS — I509 Heart failure, unspecified: Secondary | ICD-10-CM | POA: Diagnosis present

## 2014-01-03 DIAGNOSIS — Z7901 Long term (current) use of anticoagulants: Secondary | ICD-10-CM

## 2014-01-03 DIAGNOSIS — I129 Hypertensive chronic kidney disease with stage 1 through stage 4 chronic kidney disease, or unspecified chronic kidney disease: Secondary | ICD-10-CM | POA: Diagnosis present

## 2014-01-03 DIAGNOSIS — R791 Abnormal coagulation profile: Secondary | ICD-10-CM | POA: Diagnosis present

## 2014-01-03 DIAGNOSIS — F329 Major depressive disorder, single episode, unspecified: Secondary | ICD-10-CM | POA: Diagnosis present

## 2014-01-03 DIAGNOSIS — G4733 Obstructive sleep apnea (adult) (pediatric): Secondary | ICD-10-CM | POA: Diagnosis present

## 2014-01-03 DIAGNOSIS — R7309 Other abnormal glucose: Secondary | ICD-10-CM | POA: Diagnosis present

## 2014-01-03 DIAGNOSIS — F3289 Other specified depressive episodes: Secondary | ICD-10-CM | POA: Diagnosis present

## 2014-01-03 DIAGNOSIS — I82403 Acute embolism and thrombosis of unspecified deep veins of lower extremity, bilateral: Secondary | ICD-10-CM | POA: Diagnosis present

## 2014-01-03 DIAGNOSIS — J690 Pneumonitis due to inhalation of food and vomit: Secondary | ICD-10-CM | POA: Diagnosis present

## 2014-01-03 DIAGNOSIS — F3160 Bipolar disorder, current episode mixed, unspecified: Secondary | ICD-10-CM

## 2014-01-03 HISTORY — DX: Cardiac arrest, cause unspecified: I46.9

## 2014-01-03 HISTORY — DX: Intentional self-harm by other specified means, initial encounter: X83.8XXA

## 2014-01-03 HISTORY — DX: Acute embolism and thrombosis of unspecified deep veins of unspecified lower extremity: I82.409

## 2014-01-03 LAB — CBC WITH DIFFERENTIAL/PLATELET
BASOS ABS: 0 10*3/uL (ref 0.0–0.1)
BASOS PCT: 0 % (ref 0–1)
EOS ABS: 0.1 10*3/uL (ref 0.0–0.7)
Eosinophils Relative: 1 % (ref 0–5)
HEMATOCRIT: 40.2 % (ref 39.0–52.0)
Hemoglobin: 12.6 g/dL — ABNORMAL LOW (ref 13.0–17.0)
Lymphocytes Relative: 20 % (ref 12–46)
Lymphs Abs: 1.8 10*3/uL (ref 0.7–4.0)
MCH: 30.6 pg (ref 26.0–34.0)
MCHC: 31.3 g/dL (ref 30.0–36.0)
MCV: 97.6 fL (ref 78.0–100.0)
MONO ABS: 0.5 10*3/uL (ref 0.1–1.0)
Monocytes Relative: 5 % (ref 3–12)
Neutro Abs: 6.5 10*3/uL (ref 1.7–7.7)
Neutrophils Relative %: 74 % (ref 43–77)
PLATELETS: 187 10*3/uL (ref 150–400)
RBC: 4.12 MIL/uL — ABNORMAL LOW (ref 4.22–5.81)
RDW: 14.4 % (ref 11.5–15.5)
WBC: 8.9 10*3/uL (ref 4.0–10.5)

## 2014-01-03 LAB — URINALYSIS, ROUTINE W REFLEX MICROSCOPIC
Bilirubin Urine: NEGATIVE
GLUCOSE, UA: 100 mg/dL — AB
KETONES UR: NEGATIVE mg/dL
LEUKOCYTES UA: NEGATIVE
Nitrite: NEGATIVE
Protein, ur: 100 mg/dL — AB
Specific Gravity, Urine: 1.017 (ref 1.005–1.030)
Urobilinogen, UA: 0.2 mg/dL (ref 0.0–1.0)
pH: 5.5 (ref 5.0–8.0)

## 2014-01-03 LAB — ACETAMINOPHEN LEVEL: Acetaminophen (Tylenol), Serum: <15 ug/mL (ref 10–30)

## 2014-01-03 LAB — I-STAT ARTERIAL BLOOD GAS, ED
Acid-base deficit: 10 mmol/L — ABNORMAL HIGH (ref 0.0–2.0)
Acid-base deficit: 8 mmol/L — ABNORMAL HIGH (ref 0.0–2.0)
BICARBONATE: 18.8 meq/L — AB (ref 20.0–24.0)
Bicarbonate: 15.6 mEq/L — ABNORMAL LOW (ref 20.0–24.0)
O2 SAT: 94 %
O2 Saturation: 98 %
PCO2 ART: 22.8 mmHg — AB (ref 35.0–45.0)
PH ART: 7.156 — AB (ref 7.350–7.450)
Patient temperature: 98.6
TCO2: 20 mmol/L (ref 0–100)
TCO2: 16 mmol/L (ref 0–100)
pCO2 arterial: 53.2 mmHg — ABNORMAL HIGH (ref 35.0–45.0)
pH, Arterial: 7.434 (ref 7.350–7.450)
pO2, Arterial: 147 mmHg — ABNORMAL HIGH (ref 80.0–100.0)
pO2, Arterial: 62 mmHg — ABNORMAL LOW (ref 80.0–100.0)

## 2014-01-03 LAB — I-STAT TROPONIN, ED: Troponin i, poc: 0.01 ng/mL (ref 0.00–0.08)

## 2014-01-03 LAB — COMPREHENSIVE METABOLIC PANEL
ALT: 134 U/L — AB (ref 0–53)
AST: 169 U/L — AB (ref 0–37)
Albumin: 2.9 g/dL — ABNORMAL LOW (ref 3.5–5.2)
Alkaline Phosphatase: 97 U/L (ref 39–117)
Anion gap: 22 — ABNORMAL HIGH (ref 5–15)
BUN: 33 mg/dL — ABNORMAL HIGH (ref 6–23)
CO2: 19 mEq/L (ref 19–32)
Calcium: 8.4 mg/dL (ref 8.4–10.5)
Chloride: 100 mEq/L (ref 96–112)
Creatinine, Ser: 2.35 mg/dL — ABNORMAL HIGH (ref 0.50–1.35)
GFR calc Af Amer: 32 mL/min — ABNORMAL LOW (ref >90)
GFR calc non Af Amer: 28 mL/min — ABNORMAL LOW (ref >90)
Glucose, Bld: 294 mg/dL — ABNORMAL HIGH (ref 70–99)
Potassium: 4.2 mEq/L (ref 3.7–5.3)
SODIUM: 141 meq/L (ref 137–147)
TOTAL PROTEIN: 6.4 g/dL (ref 6.0–8.3)
Total Bilirubin: <0.2 mg/dL — ABNORMAL LOW (ref 0.3–1.2)

## 2014-01-03 LAB — URINE MICROSCOPIC-ADD ON

## 2014-01-03 LAB — I-STAT CHEM 8, ED
BUN: 39 mg/dL — ABNORMAL HIGH (ref 6–23)
Calcium, Ion: 1.2 mmol/L (ref 1.13–1.30)
Chloride: 103 mEq/L (ref 96–112)
Creatinine, Ser: 2.6 mg/dL — ABNORMAL HIGH (ref 0.50–1.35)
GLUCOSE: 306 mg/dL — AB (ref 70–99)
HCT: 42 % (ref 39.0–52.0)
HEMOGLOBIN: 14.3 g/dL (ref 13.0–17.0)
POTASSIUM: 4 meq/L (ref 3.7–5.3)
Sodium: 141 mEq/L (ref 137–147)
TCO2: 21 mmol/L (ref 0–100)

## 2014-01-03 LAB — MAGNESIUM: Magnesium: 2.1 mg/dL (ref 1.5–2.5)

## 2014-01-03 LAB — RAPID URINE DRUG SCREEN, HOSP PERFORMED
Amphetamines: NOT DETECTED
Barbiturates: POSITIVE — AB
Benzodiazepines: POSITIVE — AB
COCAINE: NOT DETECTED
Opiates: NOT DETECTED
Tetrahydrocannabinol: NOT DETECTED

## 2014-01-03 LAB — MRSA PCR SCREENING: MRSA by PCR: NEGATIVE

## 2014-01-03 LAB — GLUCOSE, CAPILLARY: Glucose-Capillary: 157 mg/dL — ABNORMAL HIGH (ref 70–99)

## 2014-01-03 LAB — TSH: TSH: 0.781 u[IU]/mL (ref 0.350–4.500)

## 2014-01-03 LAB — PROTIME-INR
INR: 2.67 — ABNORMAL HIGH (ref 0.00–1.49)
PROTHROMBIN TIME: 28.4 s — AB (ref 11.6–15.2)

## 2014-01-03 LAB — APTT: aPTT: 34 seconds (ref 24–37)

## 2014-01-03 LAB — TROPONIN I

## 2014-01-03 LAB — SALICYLATE LEVEL: Salicylate Lvl: <2 mg/dL — ABNORMAL LOW (ref 2.8–20.0)

## 2014-01-03 LAB — AMMONIA: Ammonia: 22 umol/L (ref 11–60)

## 2014-01-03 LAB — LITHIUM LEVEL

## 2014-01-03 LAB — I-STAT CG4 LACTIC ACID, ED: Lactic Acid, Venous: 7.06 mmol/L — ABNORMAL HIGH (ref 0.5–2.2)

## 2014-01-03 LAB — VALPROIC ACID LEVEL: Valproic Acid Lvl: 48.6 ug/mL — ABNORMAL LOW (ref 50.0–100.0)

## 2014-01-03 LAB — LACTIC ACID, PLASMA: Lactic Acid, Venous: 6.1 mmol/L — ABNORMAL HIGH (ref 0.5–2.2)

## 2014-01-03 LAB — ETHANOL

## 2014-01-03 LAB — PROCALCITONIN: PROCALCITONIN: 4.33 ng/mL

## 2014-01-03 MED ORDER — PIPERACILLIN-TAZOBACTAM 3.375 G IVPB: 3.3750 g | Freq: Three times a day (TID) | INTRAVENOUS | Status: DC

## 2014-01-03 MED ORDER — PANTOPRAZOLE SODIUM 40 MG IV SOLR
40.0000 mg | Freq: Every day | INTRAVENOUS | Status: DC
  Administered 2014-01-03 – 2014-01-04 (×2): 40 mg via INTRAVENOUS
  Filled 2014-01-03 (×3): qty 40

## 2014-01-03 MED ORDER — ASPIRIN 300 MG RE SUPP
300.0000 mg | RECTAL | Status: AC
  Filled 2014-01-03: qty 1

## 2014-01-03 MED ORDER — SODIUM CHLORIDE 0.9 % IV SOLN: 250.0000 mL | INTRAVENOUS | Status: DC | PRN

## 2014-01-03 MED ORDER — CLINDAMYCIN PHOSPHATE 600 MG/50ML IV SOLN
600.0000 mg | Freq: Three times a day (TID) | INTRAVENOUS | Status: DC
  Administered 2014-01-03 – 2014-01-04 (×2): 600 mg via INTRAVENOUS
  Filled 2014-01-03 (×4): qty 50

## 2014-01-03 MED ORDER — HEPARIN SODIUM (PORCINE) 5000 UNIT/ML IJ SOLN: 5000.0000 [IU] | Freq: Three times a day (TID) | INTRAMUSCULAR | Status: DC

## 2014-01-03 MED ORDER — SODIUM CHLORIDE 0.9 % IV BOLUS (SEPSIS)
1000.0000 mL | Freq: Once | INTRAVENOUS | Status: AC
  Administered 2014-01-03: 1000 mL via INTRAVENOUS

## 2014-01-03 MED ORDER — ASPIRIN 81 MG PO CHEW
324.0000 mg | CHEWABLE_TABLET | ORAL | Status: AC
  Administered 2014-01-04: 324 mg via ORAL
  Filled 2014-01-03: qty 4

## 2014-01-03 MED ORDER — PIPERACILLIN-TAZOBACTAM 3.375 G IVPB 30 MIN: 3.3750 g | INTRAVENOUS | Status: DC

## 2014-01-03 MED ORDER — BIOTENE DRY MOUTH MT LIQD
15.0000 mL | Freq: Four times a day (QID) | OROMUCOSAL | Status: DC
  Administered 2014-01-03 – 2014-01-06 (×11): 15 mL via OROMUCOSAL

## 2014-01-03 MED ORDER — VANCOMYCIN HCL 10 G IV SOLR
2000.0000 mg | Freq: Once | INTRAVENOUS | Status: DC
  Filled 2014-01-03: qty 2000

## 2014-01-03 MED ORDER — HEPARIN SODIUM (PORCINE) 5000 UNIT/ML IJ SOLN
5000.0000 [IU] | Freq: Three times a day (TID) | INTRAMUSCULAR | Status: DC
  Administered 2014-01-03 – 2014-01-04 (×2): 5000 [IU] via SUBCUTANEOUS
  Filled 2014-01-03 (×5): qty 1

## 2014-01-03 MED ORDER — SODIUM CHLORIDE 0.9 % IV SOLN
INTRAVENOUS | Status: DC
  Administered 2014-01-03 – 2014-01-06 (×5): via INTRAVENOUS

## 2014-01-03 MED ORDER — CHLORHEXIDINE GLUCONATE 0.12 % MT SOLN
15.0000 mL | Freq: Two times a day (BID) | OROMUCOSAL | Status: DC
  Administered 2014-01-03 – 2014-01-06 (×6): 15 mL via OROMUCOSAL
  Filled 2014-01-03 (×6): qty 15

## 2014-01-03 MED ORDER — INSULIN ASPART 100 UNIT/ML ~~LOC~~ SOLN
2.0000 [IU] | SUBCUTANEOUS | Status: DC
  Administered 2014-01-03: 4 [IU] via SUBCUTANEOUS
  Administered 2014-01-04: 2 [IU] via SUBCUTANEOUS

## 2014-01-03 NOTE — ED Notes (Signed)
Attempting to go to CT at this time

## 2014-01-03 NOTE — ED Notes (Signed)
Dr. Reather Converse made aware of decreased blood pressure.  NS bolus infusing at this time.  Pt remains unresponsive with no spontaneous resp. Present.  Pt remains on vent.

## 2014-01-03 NOTE — Progress Notes (Signed)
Unit CM UR Completed by [facility] ED CM  W. Kysean Sweet RN  

## 2014-01-03 NOTE — Progress Notes (Signed)
ANTIBIOTIC CONSULT NOTE - INITIAL  Pharmacy Consult for Zosyn Indication: aspiration PNA  No Known Allergies  Patient Measurements: Height: 6' (182.9 cm) Weight: 208 lb (94.348 kg) IBW/kg (Calculated) : 77.6  Vital Signs: Temp: 95.2 F (35.1 C) (07/20 2030) BP: 103/65 mmHg (07/20 2030) Pulse Rate: 84 (07/20 2030) Intake/Output from previous day:   Intake/Output from this shift: Total I/O In: 50 [Other:50] Out: -   Labs:  Recent Labs  12/31/2013 1842 12/30/2013 1916  WBC 8.9  --   HGB 12.6* 14.3  PLT 187  --   CREATININE 2.35* 2.60*   Estimated Creatinine Clearance: 35.1 ml/min (by C-G formula based on Cr of 2.6). No results found for this basename: VANCOTROUGH, VANCOPEAK, VANCORANDOM, GENTTROUGH, GENTPEAK, GENTRANDOM, TOBRATROUGH, TOBRAPEAK, TOBRARND, AMIKACINPEAK, AMIKACINTROU, AMIKACIN,  in the last 72 hours   Microbiology: No results found for this or any previous visit (from the past 720 hour(s)).  Medical History: Past Medical History  Diagnosis Date  . Renal cell carcinoma     chemo and xrt--lost rt kidney  . Childhood asthma   . Sinusitis   . Cellulitis     after left foot surgery  . Osteoarthritis   . OSA on CPAP     NPSG 05-08-07 AHI 63.1/hr,desat t0 80%/loud snoring  . Bipolar 1 disorder   . Hyperlipemia   . Mental disorder   . Depression   . GERD (gastroesophageal reflux disease)   . Hypertension   . Allergy   . Anxiety   . Cataract   . Neuromuscular disorder   . Osteoporosis   . Thyroid disease     Medications:  Awaiting electronic med rec  Assessment: 63 y.o. male presents with cardiac arrest/intentional suicide attempt. To begin Zosyn for asp pna. Estimated CrCl 35 ml/min. No cx yet.  Goal of Therapy:  Resolution of infection  Plan:  1. Zosyn 3.375gm IV now over 30 minutes then 3.375gm IV q8h - subsequent doses over 4 hours 2. Will f/u renal function, micro date, pt's clinical condition  Sherlon Handing, PharmD, BCPS Clinical  pharmacist, pager [phone] 12/20/2013,9:00 PM

## 2014-01-03 NOTE — ED Notes (Signed)
Return from [place],  Wife at bedside talking to critical care.

## 2014-01-03 NOTE — ED Notes (Signed)
Port CXR complete.  Preparing pt to go to CT at this time.  Pt remains unresponsive, Monitor ST, No spontaneous resp. Noted at this time.  Pt remains on vent.

## 2014-01-03 NOTE — ED Notes (Signed)
Pt. Covered in warm blankets

## 2014-01-03 NOTE — Progress Notes (Signed)
eLink Physician-Brief Progress Note Patient Name: [name] DOB: 06-Dec-1950 MRN: [id number]  Date of Service  12/19/2013   HPI/Events of Note   Heparin sq ordered for dvt proph  eICU Interventions  See orders   Intervention Category Intermediate Interventions: Best-practice therapies (e.g. DVT, beta blocker, etc.)  Asencion Noble 12/21/2013, 10:35 PM

## 2014-01-03 NOTE — H&P (Signed)
PULMONARY / CRITICAL CARE MEDICINE   Name: [name] MRN: [id number] DOB: 1950/06/19    ADMISSION DATE:  01/11/2014 CONSULTATION DATE:  12/25/2013  REFERRING MD :  EDP  CHIEF COMPLAINT:  Cardiac Arrest/Intentional Suicide attempt.  INITIAL PRESENTATION: 63 yo male with h/o renal Ca, OSA, and bipolar disorder presented to [facility] ED 7/20 unresponsive s/p cardiac arrest. Suicide note found with patient as well as empty bottles of restoril. He received  epi x 8, Narcan 8mg , and Glucagon via EMS with [facility]. Reported downtime approx. 5 minutes. PCCM asked to admit.   STUDIES:  CT Head 7/20 > No acute intracranial or C-Spine abnormalities. Suspected aspiration.  SIGNIFICANT EVENTS: 7/20 Cardiac arrest, admitted.  HISTORY OF PRESENT ILLNESS:  63 year old male with PMH as below which includes renal CA (with loss of R kidney), OSA on CPAP at home (followed by [facility]), Bipolar and other psychiatric history. Recently he had seemed to be managing his bipolar disorder well, was looking forward to trip at beach soon. He was found down at home by his wife ~5pm. Last contact with him was likely around noon when he usually talks to his mother. Police states that suicide note was found at scene, as well as bottles of risperdal. Wife states that they are always by the bed, so she is not sure if that is relevant. She states he also has a history of blood clots and has been having troubles with his legs recently. Upon EMS arrival he was without pulses, he was given several rounds of Epi, narcan, and glucagon with ROSC. He has been unresponsive since that time. In ED head CT is negative for any acute abnormality. Intubated via EMS. PCCM asked to [place].   PAST MEDICAL HISTORY :  Past Medical History  Diagnosis Date  . Renal cell carcinoma     chemo and xrt--lost rt kidney  . Childhood asthma   . Sinusitis   . Cellulitis     after left foot surgery  . Osteoarthritis   . OSA on CPAP     NPSG 05-08-07 AHI  63.1/hr,desat t0 80%/loud snoring  . Bipolar 1 disorder   . Hyperlipemia   . Mental disorder   . Depression   . GERD (gastroesophageal reflux disease)   . Hypertension   . Allergy   . Anxiety   . Cataract   . Neuromuscular disorder   . Osteoporosis   . Thyroid disease    Past Surgical History  Procedure Laterality Date  . Foot surgery      left foot toes,   . Bunionectomy    . 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 Cholecystectomy    . Colon surgery    . Kidney surgery  1998    Rt Kidney Removal    Prior to Admission medications   Medication Sig Start Date End Date Taking? Authorizing Provider  amLODipine (NORVASC) 5 MG tablet Take 1 tablet (5 mg total) by mouth daily. 09/29/13   [name], MD  divalproex (DEPAKOTE ER) 250 MG 24 hr tablet Take 3 tablets (750 mg total) by mouth every 12 (twelve) hours. 08/02/13   [name], DO  lansoprazole (PREVACID) 30 MG capsule Take 1 capsule (30 mg total) by mouth daily at 12 noon. 10/04/13   [name], MD  levothyroxine (SYNTHROID, LEVOTHROID) 50 MCG tablet Take 1 tablet (50 mcg total) by mouth daily before breakfast. 10/21/13   [name], PA-C  Lurasidone HCl (LATUDA) 20 MG TABS Take 1 tablet by mouth daily.    Historical Provider, MD  Melatonin 3 MG CAPS Take by mouth.    Historical Provider, MD  primidone (MYSOLINE) 50 MG tablet Take 50 mg by mouth at bedtime. Taking 1 tab for 5days, then 2 tabs for 5days, then 3 tabs for 5 days, 4 tabs for 5 days, then 5 tabs for 5 days. Then final dose is 250mg s QD    Historical Provider, MD  sennosides-docusate sodium (SENOKOT-S) 8.6-50 MG tablet Take 1 tablet by mouth 2 (two) times daily.     Historical Provider, MD  tamsulosin (FLOMAX) 0.4 MG CAPS capsule Take 1 capsule (0.4 mg total) by mouth daily after breakfast. 10/04/13   [name], MD  temazepam (RESTORIL) 15 MG capsule Take 15 mg by mouth at bedtime as needed for sleep.    Historical Provider, MD  warfarin (COUMADIN) 5 MG  tablet Take as directed by coumadin clinic 11/19/13   [name], MD   No Known Allergies  FAMILY HISTORY:  Family History  Problem Relation Age of Onset  . Hypertension    . Sleep apnea    . Emphysema    . Prostate cancer    . Arthritis Father   . Colon cancer Father    SOCIAL HISTORY:  reports that he has never smoked. He has never used smokeless tobacco. He reports that he drinks alcohol. He reports that he does not use illicit drugs.  REVIEW OF SYSTEMS:  Unable - intubated  SUBJECTIVE:   VITAL SIGNS: Temp:  [96.3 F (35.7 C)] 96.3 F (35.7 C) (07/20 1915) Pulse Rate:  [115-124] 115 (07/20 1915) Resp:  [22] 22 (07/20 1915) BP: (90-161)/(60-96) 90/60 mmHg (07/20 1915) SpO2:  [92 %-96 %] 92 % (07/20 1915) FiO2 (%):  [100 %] 100 % (07/20 1835) Weight:  [94.348 kg (208 lb)] 94.348 kg (208 lb) (07/20 1902) HEMODYNAMICS:   VENTILATOR SETTINGS: Vent Mode:  [-] PRVC FiO2 (%):  [100 %] 100 % Set Rate:  [22 bmp] 22 bmp Vt Set:  [620 mL] 620 mL PEEP:  [5 cmH20] 5 cmH20 Plateau Pressure:  [23 cmH20] 23 cmH20 INTAKE / OUTPUT: Intake/Output     07/20 0701 - 07/21 0700   Other 50   Total Intake(mL/kg) 50 (0.5)   Net +50         PHYSICAL EXAMINATION: General:  Intubated male of normal body habitus Neuro:  Unresponsive on vent without sedation.  HEENT:  ETT in place. Person/AT. Pupils pinpoint, unresponsive to light. Dry yellow emesis in perioral area Cardiovascular:  RRR, no MRG.  Lungs:  Coarse bilaterally.  Abdomen:  Soft, non-distended Musculoskeletal:  No acute deformity Skin:  Intact  LABS:  CBC  Recent Labs Lab 01/14/2014 1842 01/02/2014 1916  WBC 8.9  --   HGB 12.6* 14.3  HCT 40.2 42.0  PLT 187  --    Coag's  Recent Labs Lab 12/16/2013 1842  APTT 34  INR 2.67*   BMET  Recent Labs Lab 12/28/2013 1842 12/30/2013 1916  NA 141 141  K 4.2 4.0  CL 100 103  CO2 19  --   BUN 33* 39*  CREATININE 2.35* 2.60*  GLUCOSE 294* 306*    Electrolytes  Recent Labs Lab 12/22/2013 1842  CALCIUM 8.4  MG 2.1   Sepsis Markers  Recent Labs Lab 01/02/2014 1911  LATICACIDVEN 7.06*   ABG  Recent Labs Lab 12/23/2013 1904  PHART 7.156*  PCO2ART  53.2*  PO2ART 147.0*   Liver Enzymes  Recent Labs Lab 01/11/2014 1842  AST 169*  ALT 134*  ALKPHOS 97  BILITOT <0.2*  ALBUMIN 2.9*   Drugs of Abuse     Component Value Date/Time   LABOPIA NONE DETECTED 12/20/2013 1908   COCAINSCRNUR NONE DETECTED 01/02/2014 1908   LABBENZ POSITIVE* 01/12/2014 1908   AMPHETMU NONE DETECTED 12/16/2013 1908   THCU NONE DETECTED  1908   LABBARB POSITIVE* 01/14/2014 1908     Cardiac Enzymes No results found for this basename: TROPONINI, PROBNP,  in the last 168 hours Glucose No results found for this basename: GLUCAP,  in the last 168 hours  Imaging Dg Chest Portable 1 View  12/22/2013   CLINICAL DATA:  Post CPR.  EXAM: PORTABLE CHEST - 1 VIEW  COMPARISON:  07/25/2013  FINDINGS: Endotracheal tube is 3 cm above the carina. Heart is normal size. Lungs are clear. No effusions or pneumothorax.  Significant gaseous distention of the stomach.  IMPRESSION: Endotracheal tube 3 cm above the carina. No acute cardiopulmonary disease.  Significant gaseous distention of the stomach.   Electronically Signed   By: Rolm Baptise M.D.   On: 12/30/2013 19:25     CXR: Gastric distension, no acute cardiopulmonary process, ETT 3cm above carina.   ASSESSMENT / PLAN:  PULMONARY [place] 7/20 >>> A: Acute respiratory failure  2nd to cardiac arrest with unknown downtime.  Probable aspiration PNA H/o OSA on CPAP at home  P:   Full vent support Follow ABG/CXR Pulmonary Hygiene Daily SBT  CARDIOVASCULAR A:  Cardiac Arrest likely secondary to intentional overdose of benzo/barbituates.  Hypotension responding well to IVF resuscitation.   P:  Aggressive IVF resuscitation Check Echo Trend troponin/Lactic acid Low threshold to initiate pressors to  maintain MAP > 65 Not a good candidate for therapeutic hypothermia   RENAL A:   CKD baseline creat 2.6 H/o Renal Ca only L kidney remaining.    P:   IVF resuscitation Strict I&O Follow BMP  GASTROINTESTINAL A:   Elevated Transaminases likely shock liver GERD GI ppx  P:   Follow LFT Check tylenol, ASA levels Check ammonia PPI NPO  HEMATOLOGIC A:   H/o DVT on coumadin  P:  Monitor CBC/Coags   INFECTIOUS A:   Probable aspiration PNA  P:   Sputum 7/20 >>> Abx: Clindamycin, start date 7/20, day 1/7 D/c vancomycin, zosyn Check PCT and de-escalate/narrow abx as able.   ENDOCRINE A:   Hypothyroid  P:   Check TSH Hold synthroid CBG and SSI   NEUROLOGIC A:   Acute encephalopathy in setting of cardiac arrest, intentional benzo/barbituate overdose. 7/20 CT neg Bipolar disorder Depression Anxiety  P:   Monitor clinically Hold home meds (depakote, lurasidone, primidone, temazepam) Check depakote/lithium level (unclear if still taking lithium)   TODAY'S SUMMARY: Unresponsive on vent s/p cardiac arrest likley 2nd to intentional benzo/barbituate overdose. Not a therapeutic hypothermia candidate due to unknown downtime up to 5 hours. Will manage on vent and monitor clinically. Currently not requiring vasoactive medications or sedation.    Georgann Housekeeper, ACNP [facility] Pager [phone] or [phone] 12/21/2013, 8:05 PM  I have reviewed above, and examined pt.  63 yo male with likely intention drug overdose.  Unknown downtime.  Was pulseless when initially evaluated by EMS.  Intubated for airway protection.  He remains comatose.  Likely has aspiration pneumonia.  Has acute kidney injury and elevated LFT's likely from hypotension and hypoxia.  Initial CT head  unremarkable.  He is not hypothermia candidate due to uncertainty of downtime.  Updated pt's family at bedside.  Explained main concern will be whether he regains his mental  status.  CC time 40 minutes.  Chesley Mires, MD [facility] Pulmonary/Critical Care 12/24/2013, 10:04 PM Pager:  [phone] After 3pm call: [phone]

## 2014-01-03 NOTE — ED Notes (Signed)
To CT at this time with resp tech

## 2014-01-03 NOTE — ED Notes (Signed)
Pt's pants were cut off but given to wife (blue jean shorts, brown belt).  Pt's black watch was also given to wife.

## 2014-01-03 NOTE — Progress Notes (Signed)
Paged to ED trauma room B for CPR in progress. Was told that family was in consult room B Went and met with the family and offered pastoral support and care. Stayed with family until called back up to th floor for a death.. After attending to death went back to ED but patient has been moved to 2h. Talked with wife and once again offered support.

## 2014-01-03 NOTE — ED Notes (Signed)
Pt to ED unresponsive via [facility] after reported being found pulseless and apneic.  EMS also st's a suicide note was found with pt.  Pt was given Epi x's 8 and Narcan 8mg  and Glucagon 2mg   PTA.  EMS reports after Narcan was given pulses returned. On arrival to ED pt in sinus tach with no resp effort noted.

## 2014-01-03 NOTE — ED Notes (Signed)
Pt remains unresponsive with no spontaneous resp.  Pt remains on vent.  No response to verbal or painful stimuli.

## 2014-01-04 ENCOUNTER — Inpatient Hospital Stay (HOSPITAL_COMMUNITY): Payer: 59

## 2014-01-04 ENCOUNTER — Telehealth: Payer: Self-pay | Admitting: Cardiology

## 2014-01-04 DIAGNOSIS — J9600 Acute respiratory failure, unspecified whether with hypoxia or hypercapnia: Secondary | ICD-10-CM

## 2014-01-04 DIAGNOSIS — I469 Cardiac arrest, cause unspecified: Secondary | ICD-10-CM

## 2014-01-04 DIAGNOSIS — I517 Cardiomegaly: Secondary | ICD-10-CM

## 2014-01-04 LAB — BLOOD GAS, ARTERIAL
Acid-base deficit: 3.9 mmol/L — ABNORMAL HIGH (ref 0.0–2.0)
Bicarbonate: 19.3 mEq/L — ABNORMAL LOW (ref 20.0–24.0)
DRAWN BY: 331761
FIO2: 0.7 %
LHR: 16 {breaths}/min
O2 SAT: 99.2 %
PCO2 ART: 24.9 mmHg — AB (ref 35.0–45.0)
PEEP: 5 cmH2O
Patient temperature: 94.5
TCO2: 20.2 mmol/L (ref 0–100)
VT: 620 mL
pH, Arterial: 7.491 — ABNORMAL HIGH (ref 7.350–7.450)
pO2, Arterial: 140 mmHg — ABNORMAL HIGH (ref 80.0–100.0)

## 2014-01-04 LAB — BASIC METABOLIC PANEL
ANION GAP: 22 — AB (ref 5–15)
BUN: 34 mg/dL — ABNORMAL HIGH (ref 6–23)
CHLORIDE: 102 meq/L (ref 96–112)
CO2: 17 meq/L — AB (ref 19–32)
Calcium: 8.4 mg/dL (ref 8.4–10.5)
Creatinine, Ser: 2.17 mg/dL — ABNORMAL HIGH (ref 0.50–1.35)
GFR calc Af Amer: 36 mL/min — ABNORMAL LOW (ref >90)
GFR calc non Af Amer: 31 mL/min — ABNORMAL LOW (ref >90)
GLUCOSE: 147 mg/dL — AB (ref 70–99)
POTASSIUM: 4.7 meq/L (ref 3.7–5.3)
SODIUM: 141 meq/L (ref 137–147)

## 2014-01-04 LAB — PROTIME-INR
INR: 2.85 — AB (ref 0.00–1.49)
PROTHROMBIN TIME: 29.9 s — AB (ref 11.6–15.2)

## 2014-01-04 LAB — CBC
HCT: 44.4 % (ref 39.0–52.0)
HEMOGLOBIN: 14.7 g/dL (ref 13.0–17.0)
MCH: 31 pg (ref 26.0–34.0)
MCHC: 33.1 g/dL (ref 30.0–36.0)
MCV: 93.7 fL (ref 78.0–100.0)
PLATELETS: 127 10*3/uL — AB (ref 150–400)
RBC: 4.74 MIL/uL (ref 4.22–5.81)
RDW: 14.3 % (ref 11.5–15.5)
WBC: 5.4 10*3/uL (ref 4.0–10.5)

## 2014-01-04 LAB — POCT I-STAT 3, ART BLOOD GAS (G3+)
ACID-BASE DEFICIT: 4 mmol/L — AB (ref 0.0–2.0)
BICARBONATE: 19.6 meq/L — AB (ref 20.0–24.0)
O2 Saturation: 99 %
PO2 ART: 124 mmHg — AB (ref 80.0–100.0)
Patient temperature: 94.5
TCO2: 20 mmol/L (ref 0–100)
pCO2 arterial: 27.5 mmHg — ABNORMAL LOW (ref 35.0–45.0)
pH, Arterial: 7.452 — ABNORMAL HIGH (ref 7.350–7.450)

## 2014-01-04 LAB — GLUCOSE, CAPILLARY
GLUCOSE-CAPILLARY: 111 mg/dL — AB (ref 70–99)
GLUCOSE-CAPILLARY: 155 mg/dL — AB (ref 70–99)
GLUCOSE-CAPILLARY: 81 mg/dL (ref 70–99)
Glucose-Capillary: 134 mg/dL — ABNORMAL HIGH (ref 70–99)
Glucose-Capillary: 108 mg/dL — ABNORMAL HIGH (ref 70–99)
Glucose-Capillary: 114 mg/dL — ABNORMAL HIGH (ref 70–99)
Glucose-Capillary: 131 mg/dL — ABNORMAL HIGH (ref 70–99)

## 2014-01-04 LAB — HEPATIC FUNCTION PANEL
ALBUMIN: 3 g/dL — AB (ref 3.5–5.2)
ALK PHOS: 80 U/L (ref 39–117)
ALT: 139 U/L — ABNORMAL HIGH (ref 0–53)
AST: 207 U/L — ABNORMAL HIGH (ref 0–37)
Bilirubin, Direct: <0.2 mg/dL (ref 0.0–0.3)
TOTAL PROTEIN: 6.7 g/dL (ref 6.0–8.3)
Total Bilirubin: <0.2 mg/dL — ABNORMAL LOW (ref 0.3–1.2)

## 2014-01-04 LAB — LACTIC ACID, PLASMA
LACTIC ACID, VENOUS: 5.7 mmol/L — AB (ref 0.5–2.2)
LACTIC ACID, VENOUS: 3.6 mmol/L — AB (ref 0.5–2.2)

## 2014-01-04 LAB — TROPONIN I

## 2014-01-04 LAB — PROCALCITONIN: Procalcitonin: 16.07 ng/mL

## 2014-01-04 MED ORDER — WARFARIN SODIUM 2.5 MG PO TABS
2.5000 mg | ORAL_TABLET | Freq: Once | ORAL | Status: AC
  Administered 2014-01-04: 2.5 mg via ORAL
  Filled 2014-01-04: qty 1

## 2014-01-04 MED ORDER — PERFLUTREN LIPID MICROSPHERE
INTRAVENOUS | Status: AC
  Filled 2014-01-04: qty 10

## 2014-01-04 MED ORDER — WARFARIN - PHARMACIST DOSING INPATIENT
Freq: Every day | Status: DC
  Administered 2014-01-04 – 2014-01-05 (×2)

## 2014-01-04 MED ORDER — SODIUM CHLORIDE 0.9 % IV SOLN
1.5000 g | Freq: Four times a day (QID) | INTRAVENOUS | Status: DC
  Administered 2014-01-04 – 2014-01-06 (×9): 1.5 g via INTRAVENOUS
  Filled 2014-01-04 (×12): qty 1.5

## 2014-01-04 MED ORDER — PERFLUTREN LIPID MICROSPHERE
1.0000 mL | INTRAVENOUS | Status: AC | PRN
  Administered 2014-01-04: 3 mL via INTRAVENOUS
  Filled 2014-01-04: qty 10

## 2014-01-04 MED ORDER — INSULIN ASPART 100 UNIT/ML ~~LOC~~ SOLN
0.0000 [IU] | SUBCUTANEOUS | Status: DC
  Administered 2014-01-04 – 2014-01-05 (×2): 2 [IU] via SUBCUTANEOUS
  Administered 2014-01-05: 3 [IU] via SUBCUTANEOUS
  Administered 2014-01-05 (×2): 2 [IU] via SUBCUTANEOUS
  Administered 2014-01-05: 3 [IU] via SUBCUTANEOUS
  Administered 2014-01-05 – 2014-01-06 (×4): 2 [IU] via SUBCUTANEOUS

## 2014-01-04 MED ORDER — ARTIFICIAL TEARS OP OINT
TOPICAL_OINTMENT | OPHTHALMIC | Status: DC | PRN
  Administered 2014-01-05: 1 via OPHTHALMIC
  Filled 2014-01-04: qty 3.5

## 2014-01-04 MED ORDER — LEVOTHYROXINE SODIUM 50 MCG PO TABS
50.0000 ug | ORAL_TABLET | Freq: Every day | ORAL | Status: DC
  Administered 2014-01-05 – 2014-01-06 (×2): 50 ug via ORAL
  Filled 2014-01-04 (×3): qty 1

## 2014-01-04 MED ORDER — VITAL AF 1.2 CAL PO LIQD
1000.0000 mL | ORAL | Status: DC
  Administered 2014-01-04 – 2014-01-06 (×4): 1000 mL
  Filled 2014-01-04 (×6): qty 1000

## 2014-01-04 NOTE — Progress Notes (Signed)
INITIAL NUTRITION ASSESSMENT  DOCUMENTATION CODES Per approved criteria  -Not Applicable   INTERVENTION: -Initiate Vital AF 1.2 at 20 ml/hr, increase by 10 ml/hr every 4 hours to goal rate of 70 ml/hr, which will provide 2016 kcal, 126 g protein, 1362 ml free water  NUTRITION DIAGNOSIS: Inadequate oral intake related to inability to eat as evidenced by NPO status  Goal: Patient will meet >/=90% of estimated nutrition needs  Monitor:  Vent status, TF advancement and tolerance, weight, labs, I/Os  Reason for Assessment: VDRF, Consult-TF initiation  63 y.o. male  Admitting Dx: Cardiac arrest  ASSESSMENT: 63 year old male with history of renal cancer s/p righ nephrectomy, bipolar disorder, chronic kidney disease stage III. Presents to ED unresponsive s/p cardiac arrest secondary to intentional overdose of benzo/barbituates.   Patient is currently intubated on ventilator support MV: 9.5 L/min Temp (24hrs), Avg:96.6 F (35.9 C), Min:94.5 F (34.7 C), Max:99.5 F (37.5 C)  Propofol: N/A  Patient with NG tube in duodenum.   Height: Ht Readings from Last 1 Encounters:  12/17/2013 6' (1.829 m)    Weight: Wt Readings from Last 1 Encounters:  01/04/14 205 lb 0.4 oz (93 kg)    Ideal Body Weight: 178 pounds  % Ideal Body Weight: 115%  Wt Readings from Last 10 Encounters:  01/04/14 205 lb 0.4 oz (93 kg)  12/09/13 208 lb 3.2 oz (94.439 kg)  11/13/13 212 lb (96.163 kg)  11/06/13 214 lb 12.8 oz (97.433 kg)  10/16/13 204 lb (92.534 kg)  10/08/13 203 lb (92.08 kg)  09/29/13 208 lb (94.348 kg)  09/29/13 208 lb 3.2 oz (94.439 kg)  08/02/13 224 lb 13.9 oz (102 kg)  05/03/13 208 lb 9.6 oz (94.62 kg)    Usual Body Weight: 208 pounds  % Usual Body Weight: 99%  BMI:  Body mass index is 27.8 kg/(m^2).  Patient is overweight.   Estimated Nutritional Needs: Kcal: 2048 kcal Protein: 110-130 g Fluid: >2.8 L/day  Skin: Intact  Diet Order: NPO  EDUCATION NEEDS: -No  education needs identified at this time   Intake/Output Summary (Last 24 hours) at 01/04/14 1421 Last data filed at 01/04/14 1406  Gross per 24 hour  Intake   4570 ml  Output    725 ml  Net   3845 ml    Last BM: PTA   Labs:   Recent Labs Lab 12/15/2013 1842 01/09/2014 1916 01/04/14 0315  NA 141 141 141  K 4.2 4.0 4.7  CL 100 103 102  CO2 19  --  17*  BUN 33* 39* 34*  CREATININE 2.35* 2.60* 2.17*  CALCIUM 8.4  --  8.4  MG 2.1  --   --   GLUCOSE 294* 306* 147*    CBG (last 3)   Recent Labs  01/04/14 0720 01/04/14 0741 01/04/14 1123  GLUCAP 111* 108* 81    Scheduled Meds: . ampicillin-sulbactam (UNASYN) IV  1.5 g Intravenous Q6H  . antiseptic oral rinse  15 mL Mouth Rinse QID  . chlorhexidine  15 mL Mouth Rinse BID  . insulin aspart  0-15 Units Subcutaneous 6 times per day  . [START ON 01/05/2014] levothyroxine  50 mcg Oral QAC breakfast  . pantoprazole (PROTONIX) IV  40 mg Intravenous QHS  . warfarin  2.5 mg Oral ONCE-1800  . Warfarin - Pharmacist Dosing Inpatient   Does not apply q1800    Continuous Infusions: . sodium chloride 100 mL/hr at 01/04/14 0013    Past Medical History  Diagnosis  Date  . Renal cell carcinoma     chemo and xrt--lost rt kidney  . Childhood asthma   . Sinusitis   . Cellulitis     after left foot surgery  . Osteoarthritis   . OSA on CPAP     NPSG 05-08-07 AHI 63.1/hr,desat t0 80%/loud snoring  . Bipolar 1 disorder   . Hyperlipemia   . Mental disorder   . Depression   . GERD (gastroesophageal reflux disease)   . Hypertension   . Allergy   . Anxiety   . Cataract   . Neuromuscular disorder   . Osteoporosis   . Thyroid disease   . DVT (deep venous thrombosis)     on coumadin (12/2013)  . Cardiac arrest 12/31/2013  . Suicide attempt by adequate means 12/29/2013    Past Surgical History  Procedure Laterality Date  . Foot surgery      left foot toes,   . Bunionectomy    . 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  Cholecystectomy    . Colon surgery    . Kidney surgery  1998    Rt Kidney Removal     Larey Seat, RD, LDN Pager #: [phone] [place] Pager #: [phone]

## 2014-01-04 NOTE — Telephone Encounter (Signed)
New message     Need to know how pt is taking warfarin

## 2014-01-04 NOTE — Progress Notes (Signed)
  Echocardiogram 2D Echocardiogram with Definity has been performed.  [name] 01/04/2014, 11:18 AM

## 2014-01-04 NOTE — Telephone Encounter (Signed)
Returned call to pharmacist advised last INR 2.1 on 12/24/13, Coumadin dosage 7.5mg  daily except 10mg  on Wednesdays and Saturdays.  Pt is currently admitted s/p cardiac arrest, possible suicide attempt.

## 2014-01-04 NOTE — Progress Notes (Addendum)
PULMONARY / CRITICAL CARE MEDICINE   Name: [name] MRN: [id number] DOB: [birth year]    ADMISSION DATE:  01/13/2014 CONSULTATION DATE:  01/06/2014  REFERRING MD :  EDP  CHIEF COMPLAINT:  Cardiac Arrest / suicide attempt.  INITIAL PRESENTATION: 63 yo with Bipolar disorder admitted 7/20 after cardiac arrest in setting of Restoril overdose. Dountime unknown. Received  epi x 8, Narcan 8mg , and Glucagon by EMS with ROSC.  STUDIES / EVENTS:  7/20  CT Head  >>> nad  LINES: OETT 7/20 >>> OGT 7/20 >>> Foley 7/20 >>>  CULTURES: 7/20  MRSA PCR >>> neg 7/21  Respiratory >>>   ANTIBIOTIC: Clindamycin 7/20 >>> 7/21 Unasyn 7/21 >>>  INTERVAL HISTORY: No overnight events  VITAL SIGNS: Temp:  [94.5 F (34.7 C)-98.4 F (36.9 C)] 98.4 F (36.9 C) (07/21 0900) Pulse Rate:  [60-124] 75 (07/21 0954) Resp:  [16-28] 16 (07/21 0954) BP: (84-161)/(59-101) 128/81 mmHg (07/21 0954) SpO2:  [92 %-100 %] 100 % (07/21 0954) FiO2 (%):  [50 %-100 %] 50 % (07/21 0954) Weight:  [92.4 kg (203 lb 11.3 oz)-94.348 kg (208 lb)] 93 kg (205 lb 0.4 oz) (07/21 0400)  HEMODYNAMICS:   VENTILATOR SETTINGS: Vent Mode:  [-] PRVC FiO2 (%):  [50 %-100 %] 50 % Set Rate:  [16 bmp-28 bmp] 16 bmp Vt Set:  [620 mL] 620 mL PEEP:  [5 cmH20] 5 cmH20 Plateau Pressure:  [21 cmH20-23 cmH20] 21 cmH20  INTAKE / OUTPUT: Intake/Output     07/20 0701 - 07/21 0700 07/21 0701 - 07/22 0700   I.V. (mL/kg) 1660 (17.8) 200 (2.2)   Other 50    NG/GT 60    IV Piggyback 2100    Total Intake(mL/kg) 3870 (41.6) 200 (2.2)   Urine (mL/kg/hr) 630 20 (0.1)   Total Output 630 20   Net +3240 +180          PHYSICAL EXAMINATION: General:  Appears acutely ill, mechanically ventilated, synchronous Neuro:  Encephalopathic, nonfocal, cough / gag diminished HEENT:  PERRL, OETT / OGT Cardiovascular:  RRR, no m/r/g Lungs:  Bilateral diminished air entry, no w/r/r Abdomen:  Soft, nontender, bowel sounds diminished Musculoskeletal:  No  edema Skin:  Intact  LABS:  CBC  Recent Labs Lab 12/19/2013 1842 01/06/2014 1916 01/04/14 0315  WBC 8.9  --  5.4  HGB 12.6* 14.3 14.7  HCT 40.2 42.0 44.4  PLT 187  --  127*   Coag's  Recent Labs Lab 01/01/2014 1842  APTT 34  INR 2.67*   BMET  Recent Labs Lab 01/06/2014 1842  1916 01/04/14 0315  NA 141 141 141  K 4.2 4.0 4.7  CL 100 103 102  CO2 19  --  17*  BUN 33* 39* 34*  CREATININE 2.35* 2.60* 2.17*  GLUCOSE 294* 306* 147*   Electrolytes  Recent Labs Lab 01/09/2014 1842 01/04/14 0315  CALCIUM 8.4 8.4  MG 2.1  --    Sepsis Markers  Recent Labs Lab 12/27/2013 1911 12/30/2013 2230 01/04/14 0315  LATICACIDVEN 7.06* 6.1* 5.7*  PROCALCITON  --  4.33 16.07   ABG  Recent Labs Lab 12/19/2013 2047 01/04/14 0003 01/04/14 0459  PHART 7.434 7.452* 7.491*  PCO2ART 22.8* 27.5* 24.9*  PO2ART 62.0* 124.0* 140.0*   Liver Enzymes  Recent Labs Lab 01/11/2014 1842 01/04/14 0315  AST 169* 207*  ALT 134* 139*  ALKPHOS 97 80  BILITOT <0.2* <0.2*  ALBUMIN 2.9* 3.0*   Drugs of Abuse     Component Value Date/Time  LABOPIA NONE DETECTED 01/13/2014 1908   COCAINSCRNUR NONE DETECTED 12/16/2013 1908   LABBENZ POSITIVE* 01/02/2014 1908   AMPHETMU NONE DETECTED 01/04/2014 1908   THCU NONE DETECTED 12/29/2013 1908   LABBARB POSITIVE* 12/27/2013 1908     Cardiac Enzymes  Recent Labs Lab 01/06/2014 2105 01/04/14 0315 01/04/14 0745  TROPONINI <0.30 <0.30 <0.30   Glucose  Recent Labs Lab 12/30/2013 2226 01/04/14 0432 01/04/14 0720 01/04/14 0741  GLUCAP 157* 134* 111* 108*   IMAGING:   Ct Head Wo Contrast  01/04/2014   CLINICAL DATA:  Found down. Altered mental status. Possible suicide attempt.  EXAM: CT HEAD WITHOUT CONTRAST  CT CERVICAL SPINE WITHOUT CONTRAST  TECHNIQUE: Multidetector CT imaging of the head and cervical spine was performed following the standard protocol without intravenous contrast. Multiplanar CT image reconstructions of the cervical  spine were also generated.  COMPARISON:  Head CT 08/14/2013.  FINDINGS: CT HEAD FINDINGS  No acute intracranial abnormalities. Specifically, no evidence of acute intracranial hemorrhage, no definite findings of acute/subacute cerebral ischemia, no mass, mass effect, hydrocephalus or abnormal intra or extra-axial fluid collections. No acute displaced skull fractures are identified. Mastoids are well pneumatized bilaterally. Paranasal sinuses are remarkable for some multifocal mucosal thickening throughout the ethmoid sinuses bilaterally, and a large air-fluid level in the left sphenoid sinus.  CT CERVICAL SPINE FINDINGS  Patient is intubated, and an orogastric tube is in position, limiting assessment for prevertebral soft tissue swelling. No acute displaced fractures are noted. Alignment is anatomic. Within the limitations of this examination, the prevertebral soft tissues appear grossly normal. Multilevel degenerative disc disease, most severe at C4-C5, C5-C6 and C6-C7. Mild multilevel facet arthropathy. Dependent opacities in the posterior aspects of the upper lobes of the lungs bilaterally are noted, potentially sequela of recent aspiration.  IMPRESSION: 1. No acute intracranial abnormalities. 2. No acute abnormality of the cervical spine. 3. Extensive dependent opacities in the upper lobes of the lungs bilaterally (right greater than left), likely sequela of recent aspiration. 4. Air-fluid level in the left sphenoid sinus may suggest acute sinusitis. 5. Mild multilevel degenerative disc disease and cervical spondylosis, as above.   Electronically Signed   By: Vinnie Langton M.D.   On: 12/25/2013 20:32   Ct Cervical Spine Wo Contrast  12/23/2013   CLINICAL DATA:  Found down. Altered mental status. Possible suicide attempt.  EXAM: CT HEAD WITHOUT CONTRAST  CT CERVICAL SPINE WITHOUT CONTRAST  TECHNIQUE: Multidetector CT imaging of the head and cervical spine was performed following the standard protocol without  intravenous contrast. Multiplanar CT image reconstructions of the cervical spine were also generated.  COMPARISON:  Head CT 08/14/2013.  FINDINGS: CT HEAD FINDINGS  No acute intracranial abnormalities. Specifically, no evidence of acute intracranial hemorrhage, no definite findings of acute/subacute cerebral ischemia, no mass, mass effect, hydrocephalus or abnormal intra or extra-axial fluid collections. No acute displaced skull fractures are identified. Mastoids are well pneumatized bilaterally. Paranasal sinuses are remarkable for some multifocal mucosal thickening throughout the ethmoid sinuses bilaterally, and a large air-fluid level in the left sphenoid sinus.  CT CERVICAL SPINE FINDINGS  Patient is intubated, and an orogastric tube is in position, limiting assessment for prevertebral soft tissue swelling. No acute displaced fractures are noted. Alignment is anatomic. Within the limitations of this examination, the prevertebral soft tissues appear grossly normal. Multilevel degenerative disc disease, most severe at C4-C5, C5-C6 and C6-C7. Mild multilevel facet arthropathy. Dependent opacities in the posterior aspects of the upper lobes of the lungs bilaterally  are noted, potentially sequela of recent aspiration.  IMPRESSION: 1. No acute intracranial abnormalities. 2. No acute abnormality of the cervical spine. 3. Extensive dependent opacities in the upper lobes of the lungs bilaterally (right greater than left), likely sequela of recent aspiration. 4. Air-fluid level in the left sphenoid sinus may suggest acute sinusitis. 5. Mild multilevel degenerative disc disease and cervical spondylosis, as above.   Electronically Signed   By: Vinnie Langton M.D.   On: 01/06/2014 20:32   Dg Chest Port 1 View  01/04/2014   CLINICAL DATA:  Intubation.  EXAM: PORTABLE CHEST - 1 VIEW  COMPARISON:  01/13/2014  FINDINGS: Endotracheal tube noted 2.6 cm above the carina. NG tube noted with tip below the left hemidiaphragm.  Mild basilar atelectasis. No pleural effusion or pneumothorax. Heart size and pulmonary vascularity normal. No acute osseus abnormality.  IMPRESSION: 1. Endotracheal tube noted 2.6 cm above the carina. NG tube below left hemidiaphragm. 2. Mild bibasilar atelectasis.   Electronically Signed   By: Marcello Moores  Register   On: 01/04/2014 07:29   Dg Chest Portable 1 View  01/06/2014   CLINICAL DATA:  Post CPR.  EXAM: PORTABLE CHEST - 1 VIEW  COMPARISON:  07/25/2013  FINDINGS: Endotracheal tube is 3 cm above the carina. Heart is normal size. Lungs are clear. No effusions or pneumothorax.  Significant gaseous distention of the stomach.  IMPRESSION: Endotracheal tube 3 cm above the carina. No acute cardiopulmonary disease.  Significant gaseous distention of the stomach.   Electronically Signed   By: Rolm Baptise M.D.   On: 01/13/2014 19:25   Dg Abd Portable 1v  01/06/2014   CLINICAL DATA:  Nasogastric tube placement.  EXAM: PORTABLE ABDOMEN - 1 VIEW  COMPARISON:  Acute abdominal series done 12/10/2012.  FINDINGS: 2241 hr. Nasogastric tube tip is in the descending duodenum. External pacer and multiple telemetry leads overlie the upper abdomen and lower chest. There are multiple surgical clips in the right abdomen. The visualized bowel gas pattern is normal. Lumbar spine degenerative changes are stable.  IMPRESSION: Nasogastric tube tip in the descending duodenum. Stable nonobstructive bowel gas pattern.   Electronically Signed   By: Camie Patience M.D.   On: 01/10/2014 23:40   ASSESSMENT / PLAN:  PULMONARY A: Acute respiratory failure in setting of benzo / barbiturates overdose Probable aspiration pneumonia OSA on CPAP P:   Goal pH>7.30, SpO2>92 Continuous mechanical support VAP bundle Daily SBT Trend ABG/CXR  CARDIOVASCULAR A:  Cardiac arrest likely secondary the respiratory event Transient hypotension, resolved No evidence of sichemia P:  Goal MAP>65 TTE Hold Norvasc  RENAL A:   Acute on chronic  renal failure Renal CA s/p R nephrectomy P:   Trend BMP NS@100   GASTROINTESTINAL A:   Elevated transaminases, likely shocked liver in setting of arrest Doubt Tylenol toxicity ( level < 15 ) GERD on PPI P:   NPO Start TF per Nutritionist Protonix  HEMATOLOGIC A:   H/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 P:  Trend CBC Coumadin per Pharmacy D/c Heparin [facility]   INFECTIOUS A:   Probable aspiration pneumonia P:   Cx as above  D/c Clindamycin as high risk of C.dif Start Unasyn  ENDOCRINE A:   Hypothyroidism Hyperglycemia  P:   SSI Synthroid  NEUROLOGIC A:   Bipolar mood disorder Benzo / barbituate overdose Acute encephalopathy Possible anoxia P:   Goal RASS 0 to -1 Hold preadmissionDepakote, Lurasidone, Primidone, Temazepam) Off sedation  I have personally obtained history, examined patient, evaluated and interpreted laboratory  and imaging results, reviewed medical records, formulated assessment / plan and placed orders.  CRITICAL CARE:  The patient is critically ill with multiple organ systems failure and requires high complexity decision making for assessment and support, frequent evaluation and titration of therapies, application of advanced monitoring technologies and extensive interpretation of multiple databases. Critical Care Time devoted to patient care services described in this note is 35 minutes.   Doree Fudge, MD Pulmonary and [place] Pager: [phone]  01/04/2014, 10:50 AM

## 2014-01-04 NOTE — Progress Notes (Addendum)
ANTICOAGULATION  AND ANTIBIOTIC CONSULT NOTE - Initial Consult  Pharmacy Consult for warfarin; Unasyn Indication: hx DVT; aspiration PNA  No Known Allergies  Patient Measurements: Height: 6' (182.9 cm) Weight: 205 lb 0.4 oz (93 kg) IBW/kg (Calculated) : 77.6  Vital Signs: Temp: 98.8 F (37.1 C) (07/21 1000) Temp src: Core (Comment) (07/21 0800) BP: 128/81 mmHg (07/21 1000) Pulse Rate: 74 (07/21 1000)  Labs:  Recent Labs  01/09/2014 1842 12/18/2013 1916 12/17/2013 2105 01/04/14 0315 01/04/14 0745  HGB 12.6* 14.3  --  14.7  --   HCT 40.2 42.0  --  44.4  --   PLT 187  --   --  127*  --   APTT 34  --   --   --   --   LABPROT 28.4*  --   --   --   --   INR 2.67*  --   --   --   --   CREATININE 2.35* 2.60*  --  2.17*  --   TROPONINI  --   --  <0.30 <0.30 <0.30    Estimated Creatinine Clearance: 38.7 ml/min (by C-G formula based on Cr of 2.17).   Medical History: Past Medical History  Diagnosis Date  . Renal cell carcinoma     chemo and xrt--lost rt kidney  . Childhood asthma   . Sinusitis   . Cellulitis     after left foot surgery  . Osteoarthritis   . OSA on CPAP     NPSG 05-08-07 AHI 63.1/hr,desat t0 80%/loud snoring  . Bipolar 1 disorder   . Hyperlipemia   . Mental disorder   . Depression   . GERD (gastroesophageal reflux disease)   . Hypertension   . Allergy   . Anxiety   . Cataract   . Neuromuscular disorder   . Osteoporosis   . Thyroid disease   . DVT (deep venous thrombosis)     on coumadin (12/2013)  . Cardiac arrest 12/31/2013  . Suicide attempt by adequate means 01/06/2014    Medications:  Prescriptions prior to admission  Medication Sig Dispense Refill  . amLODipine (NORVASC) 5 MG tablet Take 1 tablet (5 mg total) by mouth daily.  30 tablet  4  . divalproex (DEPAKOTE ER) 250 MG 24 hr tablet Take 3 tablets (750 mg total) by mouth every 12 (twelve) hours.      . docusate sodium (COLACE) 100 MG capsule Take 100 mg by mouth 2 (two) times daily.       . lansoprazole (PREVACID) 30 MG capsule Take 1 capsule (30 mg total) by mouth daily at 12 noon.  30 capsule  11  . levothyroxine (SYNTHROID, LEVOTHROID) 50 MCG tablet Take 1 tablet (50 mcg total) by mouth daily before breakfast.  90 tablet  1  . Lurasidone HCl (LATUDA) 20 MG TABS Take 20 mg by mouth daily.       . Melatonin 3 MG CAPS Take 3 mg by mouth at bedtime.       . NON FORMULARY 1 each by Other route See admin instructions. Uses a CPAP machine nightly.      Marland Kitchen PRESCRIPTION MEDICATION Take 1 tablet by mouth at bedtime. "Starts with a  "M" and is for depression"      . primidone (MYSOLINE) 50 MG tablet Take 250 mg by mouth at bedtime.       . sennosides-docusate sodium (SENOKOT-S) 8.6-50 MG tablet Take 1 tablet by mouth 2 (two) times daily.       Marland Kitchen  tamsulosin (FLOMAX) 0.4 MG CAPS capsule Take 1 capsule (0.4 mg total) by mouth daily after breakfast.  30 capsule  11  . temazepam (RESTORIL) 15 MG capsule Take 15 mg by mouth at bedtime as needed for sleep.      Marland Kitchen warfarin (COUMADIN) 5 MG tablet Take 5 mg by mouth.        Assessment: 63 y.o. male who presented on 7/20 with cardiac arrest/intentional suicide attempt. Patient found with suicide note and 2 restoril bottles by him. Pharmacy started Zosyn last night for aspiration PNA but has been consulted to change to Unasyn. He received no Zosyn per MAR. Also, patient took warfarin PTA for hx DVT and we are consulted to resume this. LFTs are elevated. WBC, H/H, and platelets are normal. He is afebrile. SCr is elevated and he has a hx of acute on chronic renal failure. INR was 2.67 yesterday. Unsure of exact last dose at home. Per warfarin clinic patient is to take 7.5 mg daily except 10 mg on Wed, Sat.  clinda 7/20>>7/21  Resp Cx pending  Goal of Therapy:  INR 2-3 Eradication of infection   Plan:  - Unasyn 1.5 g IV q6h - Monitor renal function and clinical progress - INR now then daily; will dose warfarin based on trend of INR  [facility], [place].D., BCPS Clinical Pharmacist Pager: [phone] 01/04/2014 11:07 AM   Addendum: INR 2.85 >> 2.67 without dose of warfarin last night  Will give small dose tonight and watch INR trend.  Warfarin 2.5 mg PO tonight INR daily  [facility], Pharm.D., BCPS Clinical Pharmacist Pager: [phone] 01/04/2014 2:11 PM

## 2014-01-05 ENCOUNTER — Inpatient Hospital Stay (HOSPITAL_COMMUNITY): Payer: 59

## 2014-01-05 ENCOUNTER — Telehealth: Payer: Self-pay | Admitting: Emergency Medicine

## 2014-01-05 ENCOUNTER — Telehealth: Payer: Self-pay

## 2014-01-05 DIAGNOSIS — G931 Anoxic brain damage, not elsewhere classified: Secondary | ICD-10-CM | POA: Diagnosis present

## 2014-01-05 DIAGNOSIS — F3160 Bipolar disorder, current episode mixed, unspecified: Secondary | ICD-10-CM

## 2014-01-05 DIAGNOSIS — I469 Cardiac arrest, cause unspecified: Secondary | ICD-10-CM

## 2014-01-05 LAB — GLUCOSE, CAPILLARY
Glucose-Capillary: 148 mg/dL — ABNORMAL HIGH (ref 70–99)
Glucose-Capillary: 151 mg/dL — ABNORMAL HIGH (ref 70–99)
Glucose-Capillary: 149 mg/dL — ABNORMAL HIGH (ref 70–99)
Glucose-Capillary: 151 mg/dL — ABNORMAL HIGH (ref 70–99)
Glucose-Capillary: 127 mg/dL — ABNORMAL HIGH (ref 70–99)
Glucose-Capillary: 136 mg/dL — ABNORMAL HIGH (ref 70–99)

## 2014-01-05 LAB — BASIC METABOLIC PANEL
Anion gap: 17 — ABNORMAL HIGH (ref 5–15)
BUN: 42 mg/dL — AB (ref 6–23)
CALCIUM: 8.9 mg/dL (ref 8.4–10.5)
CO2: 18 mEq/L — ABNORMAL LOW (ref 19–32)
Chloride: 106 mEq/L (ref 96–112)
Creatinine, Ser: 2.66 mg/dL — ABNORMAL HIGH (ref 0.50–1.35)
GFR calc non Af Amer: 24 mL/min — ABNORMAL LOW (ref >90)
GFR, EST AFRICAN AMERICAN: 28 mL/min — AB (ref >90)
Glucose, Bld: 138 mg/dL — ABNORMAL HIGH (ref 70–99)
POTASSIUM: 4.7 meq/L (ref 3.7–5.3)
SODIUM: 141 meq/L (ref 137–147)

## 2014-01-05 LAB — CBC
HEMATOCRIT: 39.8 % (ref 39.0–52.0)
Hemoglobin: 13.4 g/dL (ref 13.0–17.0)
MCH: 31.2 pg (ref 26.0–34.0)
MCHC: 33.7 g/dL (ref 30.0–36.0)
MCV: 92.6 fL (ref 78.0–100.0)
Platelets: 138 10*3/uL — ABNORMAL LOW (ref 150–400)
RBC: 4.3 MIL/uL (ref 4.22–5.81)
RDW: 15 % (ref 11.5–15.5)
WBC: 6.8 10*3/uL (ref 4.0–10.5)

## 2014-01-05 LAB — HEPATIC FUNCTION PANEL
ALK PHOS: 73 U/L (ref 39–117)
ALT: 76 U/L — ABNORMAL HIGH (ref 0–53)
AST: 126 U/L — ABNORMAL HIGH (ref 0–37)
Albumin: 2.6 g/dL — ABNORMAL LOW (ref 3.5–5.2)
TOTAL PROTEIN: 6.5 g/dL (ref 6.0–8.3)

## 2014-01-05 LAB — PROTIME-INR
INR: 3.23 — AB (ref 0.00–1.49)
Prothrombin Time: 33 seconds — ABNORMAL HIGH (ref 11.6–15.2)

## 2014-01-05 LAB — PROCALCITONIN: Procalcitonin: 22.99 ng/mL

## 2014-01-05 MED ORDER — PANTOPRAZOLE SODIUM 40 MG PO PACK
40.0000 mg | PACK | Freq: Every day | ORAL | Status: DC
  Administered 2014-01-05: 40 mg
  Filled 2014-01-05 (×2): qty 20

## 2014-01-05 NOTE — Progress Notes (Addendum)
PULMONARY / CRITICAL CARE MEDICINE   Name: [name] MRN: [id number] DOB: 04/06/1951    ADMISSION DATE:  01/02/2014 CONSULTATION DATE:  01/13/2014  REFERRING MD :  EDP  CHIEF COMPLAINT:  Cardiac Arrest / suicide attempt.  INITIAL PRESENTATION: 63 yo with Bipolar disorder admitted 7/20 after cardiac arrest in setting of Restoril overdose. Dountime unknown. Received  epi x 8, Narcan 8mg , and Glucagon by EMS with ROSC.  STUDIES / EVENTS:  7/20  CT Head  >>> nad 7/21  TTE >>> EF 60%, possible mild hypokinesis of apical myocardium. Grade 1 DD  LINES: OETT 7/20 >>> OGT 7/20 >>> Foley 7/20 >>>  CULTURES: 7/20  MRSA PCR >>> neg 7/21  Respiratory >>>   ANTIBIOTIC: Clindamycin 7/20 >>> 7/21 Unasyn 7/21 >>>  INTERVAL HISTORY: Remains with minimal neurological findings.  VITAL SIGNS: Temp:  [96.6 F (35.9 C)-99.7 F (37.6 C)] 96.8 F (36 C) (07/22 0700) Pulse Rate:  [71-83] 82 (07/22 0746) Resp:  [16-19] 16 (07/22 0700) BP: (124-165)/(77-98) 165/95 mmHg (07/22 0746) SpO2:  [98 %-100 %] 100 % (07/22 0746) FiO2 (%):  [40 %-50 %] 40 % (07/22 0746) Weight:  [94.7 kg (208 lb 12.4 oz)] 94.7 kg (208 lb 12.4 oz) (07/22 0400)  HEMODYNAMICS:   VENTILATOR SETTINGS: Vent Mode:  [-] PRVC FiO2 (%):  [40 %-50 %] 40 % Set Rate:  [16 bmp] 16 bmp Vt Set:  [620 mL] 620 mL PEEP:  [5 cmH20] 5 cmH20 Plateau Pressure:  [19 cmH20-23 cmH20] 23 cmH20  INTAKE / OUTPUT: Intake/Output     07/21 0701 - 07/22 0700 07/22 0701 - 07/23 0700   I.V. (mL/kg) 2250 (23.8)    Other     NG/GT 587.7    IV Piggyback 200    Total Intake(mL/kg) 3037.7 (32.1)    Urine (mL/kg/hr) 700 (0.3)    Total Output 700     Net +2337.7            PHYSICAL EXAMINATION: General:  Mechanically ventilated, synchronous Neuro:  Pupils are minimally responsive, paucity of neurological findings otherwise HEENT:  PERRL, OETT / OGT Cardiovascular:  RRR, no m/r/g Lungs:  Bilateral diminished air entry, no w/r/r Abdomen:   Soft, nontender, bowel sounds diminished Musculoskeletal:  No edema Skin:  Intact  LABS:  CBC  Recent Labs Lab 12/27/2013 1842 01/02/2014 1916 01/04/14 0315 01/05/14 0226  WBC 8.9  --  5.4 6.8  HGB 12.6* 14.3 14.7 13.4  HCT 40.2 42.0 44.4 39.8  PLT 187  --  127* 138*   Coag's  Recent Labs Lab 01/11/2014 1842 01/04/14 1250 01/05/14 0226  APTT 34  --   --   INR 2.67* 2.85* 3.23*   BMET  Recent Labs Lab 01/13/2014 1842 01/02/2014 1916 01/04/14 0315 01/05/14 0226  NA 141 141 141 141  K 4.2 4.0 4.7 4.7  CL 100 103 102 106  CO2 19  --  17* 18*  BUN 33* 39* 34* 42*  CREATININE 2.35* 2.60* 2.17* 2.66*  GLUCOSE 294* 306* 147* 138*   Electrolytes  Recent Labs Lab 12/17/2013 1842 01/04/14 0315 01/05/14 0226  CALCIUM 8.4 8.4 8.9  MG 2.1  --   --    Sepsis Markers  Recent Labs Lab 12/16/2013 2230 01/04/14 0315 01/04/14 1040 01/05/14 0226  LATICACIDVEN 6.1* 5.7* 3.6*  --   PROCALCITON 4.33 16.07  --  22.99   ABG  Recent Labs Lab 12/23/2013 2047 01/04/14 0003 01/04/14 0459  PHART 7.434 7.452* 7.491*  PCO2ART  22.8* 27.5* 24.9*  PO2ART 62.0* 124.0* 140.0*   Liver Enzymes  Recent Labs Lab 12/29/2013 1842 01/04/14 0315  AST 169* 207*  ALT 134* 139*  ALKPHOS 97 80  BILITOT <0.2* <0.2*  ALBUMIN 2.9* 3.0*   Drugs of Abuse     Component Value Date/Time   LABOPIA NONE DETECTED 01/06/2014 1908   COCAINSCRNUR NONE DETECTED 12/29/2013 1908   LABBENZ POSITIVE* 01/02/2014 1908   AMPHETMU NONE DETECTED 01/13/2014 1908   THCU NONE DETECTED 01/06/2014 1908   LABBARB POSITIVE* 12/29/2013 1908    Cardiac Enzymes  Recent Labs Lab 12/31/2013 2105 01/04/14 0315 01/04/14 0745  TROPONINI <0.30 <0.30 <0.30   Glucose  Recent Labs Lab 01/04/14 0741 01/04/14 1123 01/04/14 1555 01/04/14 2027 01/04/14 2339 01/05/14 0337  GLUCAP 108* 81 114* 131* 155* 148*   IMAGING:   Ct Head Wo Contrast  12/15/2013   CLINICAL DATA:  Found down. Altered mental status. Possible  suicide attempt.  EXAM: CT HEAD WITHOUT CONTRAST  CT CERVICAL SPINE WITHOUT CONTRAST  TECHNIQUE: Multidetector CT imaging of the head and cervical spine was performed following the standard protocol without intravenous contrast. Multiplanar CT image reconstructions of the cervical spine were also generated.  COMPARISON:  Head CT 08/14/2013.  FINDINGS: CT HEAD FINDINGS  No acute intracranial abnormalities. Specifically, no evidence of acute intracranial hemorrhage, no definite findings of acute/subacute cerebral ischemia, no mass, mass effect, hydrocephalus or abnormal intra or extra-axial fluid collections. No acute displaced skull fractures are identified. Mastoids are well pneumatized bilaterally. Paranasal sinuses are remarkable for some multifocal mucosal thickening throughout the ethmoid sinuses bilaterally, and a large air-fluid level in the left sphenoid sinus.  CT CERVICAL SPINE FINDINGS  Patient is intubated, and an orogastric tube is in position, limiting assessment for prevertebral soft tissue swelling. No acute displaced fractures are noted. Alignment is anatomic. Within the limitations of this examination, the prevertebral soft tissues appear grossly normal. Multilevel degenerative disc disease, most severe at C4-C5, C5-C6 and C6-C7. Mild multilevel facet arthropathy. Dependent opacities in the posterior aspects of the upper lobes of the lungs bilaterally are noted, potentially sequela of recent aspiration.  IMPRESSION: 1. No acute intracranial abnormalities. 2. No acute abnormality of the cervical spine. 3. Extensive dependent opacities in the upper lobes of the lungs bilaterally (right greater than left), likely sequela of recent aspiration. 4. Air-fluid level in the left sphenoid sinus may suggest acute sinusitis. 5. Mild multilevel degenerative disc disease and cervical spondylosis, as above.   Electronically Signed   By: Vinnie Langton M.D.   On: 12/22/2013 20:32   Ct Cervical Spine Wo  Contrast  01/13/2014   CLINICAL DATA:  Found down. Altered mental status. Possible suicide attempt.  EXAM: CT HEAD WITHOUT CONTRAST  CT CERVICAL SPINE WITHOUT CONTRAST  TECHNIQUE: Multidetector CT imaging of the head and cervical spine was performed following the standard protocol without intravenous contrast. Multiplanar CT image reconstructions of the cervical spine were also generated.  COMPARISON:  Head CT 08/14/2013.  FINDINGS: CT HEAD FINDINGS  No acute intracranial abnormalities. Specifically, no evidence of acute intracranial hemorrhage, no definite findings of acute/subacute cerebral ischemia, no mass, mass effect, hydrocephalus or abnormal intra or extra-axial fluid collections. No acute displaced skull fractures are identified. Mastoids are well pneumatized bilaterally. Paranasal sinuses are remarkable for some multifocal mucosal thickening throughout the ethmoid sinuses bilaterally, and a large air-fluid level in the left sphenoid sinus.  CT CERVICAL SPINE FINDINGS  Patient is intubated, and an orogastric tube is in  position, limiting assessment for prevertebral soft tissue swelling. No acute displaced fractures are noted. Alignment is anatomic. Within the limitations of this examination, the prevertebral soft tissues appear grossly normal. Multilevel degenerative disc disease, most severe at C4-C5, C5-C6 and C6-C7. Mild multilevel facet arthropathy. Dependent opacities in the posterior aspects of the upper lobes of the lungs bilaterally are noted, potentially sequela of recent aspiration.  IMPRESSION: 1. No acute intracranial abnormalities. 2. No acute abnormality of the cervical spine. 3. Extensive dependent opacities in the upper lobes of the lungs bilaterally (right greater than left), likely sequela of recent aspiration. 4. Air-fluid level in the left sphenoid sinus may suggest acute sinusitis. 5. Mild multilevel degenerative disc disease and cervical spondylosis, as above.   Electronically Signed    By: Vinnie Langton M.D.   On: 12/15/2013 20:32   Dg Chest Port 1 View  01/05/2014   CLINICAL DATA:  Assess endotracheal tube  EXAM: PORTABLE CHEST - 1 VIEW  COMPARISON:  Prior chest x-ray 01/04/2014  FINDINGS: The endotracheal tube is 2.5 cm above the carina. The nasogastric tube is incompletely imaged. The tip lies off the field of view, below the diaphragm and likely within the stomach. External defibrillator pad projects over the upper chest. Cardiac and mediastinal contours remain unchanged. Decreasing aeration in the lung bases over the last 24 hr. Likely new layering pleural effusion on the right with associated right basilar atelectasis. The left lung is relatively clear despite the lower inspiratory volumes and mild subsegmental atelectasis. No overt pulmonary edema. Osseous structures intact and unremarkable for age.  IMPRESSION: 1. New veil like opacity in the right lung base most consistent with a developing layering pleural effusion and superimposed atelectasis. In the appropriate clinical setting, aspiration could have a similar appearance. 2. Slightly lower inspiratory volumes. 3. Stable subsegmental atelectasis in the left lung base. 4. Stable and satisfactory support apparatus.   Electronically Signed   By: Jacqulynn Cadet M.D.   On: 01/05/2014 07:33   Dg Chest Port 1 View  01/04/2014   CLINICAL DATA:  Intubation.  EXAM: PORTABLE CHEST - 1 VIEW  COMPARISON:  01/06/2014  FINDINGS: Endotracheal tube noted 2.6 cm above the carina. NG tube noted with tip below the left hemidiaphragm. Mild basilar atelectasis. No pleural effusion or pneumothorax. Heart size and pulmonary vascularity normal. No acute osseus abnormality.  IMPRESSION: 1. Endotracheal tube noted 2.6 cm above the carina. NG tube below left hemidiaphragm. 2. Mild bibasilar atelectasis.   Electronically Signed   By: Marcello Moores  Register   On: 01/04/2014 07:29   Dg Chest Portable 1 View  01/02/2014   CLINICAL DATA:  Post CPR.  EXAM:  PORTABLE CHEST - 1 VIEW  COMPARISON:  07/25/2013  FINDINGS: Endotracheal tube is 3 cm above the carina. Heart is normal size. Lungs are clear. No effusions or pneumothorax.  Significant gaseous distention of the stomach.  IMPRESSION: Endotracheal tube 3 cm above the carina. No acute cardiopulmonary disease.  Significant gaseous distention of the stomach.   Electronically Signed   By: Rolm Baptise M.D.   On: 12/17/2013 19:25   Dg Abd Portable 1v  12/20/2013   CLINICAL DATA:  Nasogastric tube placement.  EXAM: PORTABLE ABDOMEN - 1 VIEW  COMPARISON:  Acute abdominal series done 12/10/2012.  FINDINGS: 2241 hr. Nasogastric tube tip is in the descending duodenum. External pacer and multiple telemetry leads overlie the upper abdomen and lower chest. There are multiple surgical clips in the right abdomen. The visualized bowel gas pattern  is normal. Lumbar spine degenerative changes are stable.  IMPRESSION: Nasogastric tube tip in the descending duodenum. Stable nonobstructive bowel gas pattern.   Electronically Signed   By: Camie Patience M.D.   On: 01/01/2014 23:40   ASSESSMENT / PLAN:  PULMONARY A: Acute respiratory failure in setting of benzo / barbiturates overdose Probable aspiration pneumonia OSA on CPAP P:   Goal pH>7.30, SpO2>92 Continuous mechanical support VAP bundle Daily SBT but mental status is limiting extubation Trend ABG/CXR  CARDIOVASCULAR A:  Cardiac arrest likely secondary the respiratory event Transient hypotension, resolved No evidence of sichemia Chronic diastolic heart faialure P:  Goal MAP>65 TTE Hold Norvasc  RENAL A:   Acute on chronic renal failure Renal CA s/p R nephrectomy P:   Trend BMP NS@100   GASTROINTESTINAL A:   Elevated transaminases, likely shocked liver in setting of arrest Doubt Tylenol toxicity ( level < 15 ) GERD on PPI P:   NPO Start TF per Nutritionist Protonix Repeat LFT  HEMATOLOGIC A:   H/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 Coagulopathy P:  Trend  CBC Coumadin per Pharmacy - restart when appropriate   INFECTIOUS A:   Probable aspiration pneumonia P:   Cx / abx as above   ENDOCRINE A:   Hypothyroidism Hyperglycemia  P:   SSI Synthroid  NEUROLOGIC A:   Bipolar mood disorder Benzo / barbituate overdose Acute encephalopathy Possible anoxia R/o non convulsive status  P:   Goal RASS 0 to -1 Hold preadmission Depakote, Lurasidone, Primidone, Temazepam) Off sedation EEG  I have personally obtained history, examined patient, evaluated and interpreted laboratory and imaging results, reviewed medical records, formulated assessment / plan and placed orders.  CRITICAL CARE:  The patient is critically ill with multiple organ systems failure and requires high complexity decision making for assessment and support, frequent evaluation and titration of therapies, application of advanced monitoring technologies and extensive interpretation of multiple databases. Critical Care Time devoted to patient care services described in this note is 35 minutes.   Doree Fudge, MD Pulmonary and [place] Pager: [phone]  01/05/2014, 8:42 AM

## 2014-01-05 NOTE — Progress Notes (Signed)
Attempted SBT, pt did not tol d/t low vt, low min volume.  Increased PS per protocol, Dr. Jamal Collin at bedside-aware.  Per MD, placed pt back on full vent support d/t no plans for extubation today d/t mental status.

## 2014-01-05 NOTE — Telephone Encounter (Signed)
Pt's sister called in , and wanted to let Dr. Everlene Farrier know that [name] is at [facility] on life support. She became very emotional, and hung up the phone before I could get much more information then that.

## 2014-01-05 NOTE — Procedures (Signed)
ELECTROENCEPHALOGRAM REPORT  Patient: Zeki Bedrosian Labrada       Room #: 7W62 EEG No. ID: [id number] Age: 63 y.o.        Sex: male Referring Physician: Penne Lash Report Date:  01/05/2014        Interpreting Physician: Anthony Sar  History: Jorel Gravlin Hehl is an 63 y.o. male admitted following cardiac arrest in the setting of Restoril overdose. Patient has remained unresponsive and intubated on mechanical ventilation.  Indications for study:  Assess severity of encephalopathy; rule out seizure activity.  Technique: This is an 18 channel routine scalp EEG performed at the bedside with bipolar and monopolar montages arranged in accordance to the international 10/20 system of electrode placement.   Description: Patient was intubated and on mechanical ventilation at the time of this recording. Study was evaluated at increased sensitivity setting. Very infrequent occurrence of diffuse low amplitude 7-8 Hz slightly rhythmic activity was recorded, lasting up to 2 seconds, alternating with long periods of no detectable brain activity recorded. Photic stimulation was not performed. No epileptiform discharges were recorded.  Interpretation: This EEG is markedly abnormal with findings consistent with severe diffuse encephalopathy with a pattern of burst suppression with very minimal detectable brain activity during bursts. Findings are consistent with severe diffuse anoxic brain injury. No evidence of epileptic activity was seen.   Rush Farmer M.D. [facility] [phone]

## 2014-01-05 NOTE — Progress Notes (Signed)
EEG Completed; Results Pending  

## 2014-01-05 NOTE — Progress Notes (Signed)
ANTICOAGULATION CONSULT NOTE - Follow Up Consult  Pharmacy Consult for warfarin Indication: hx DVT  No Known Allergies  Patient Measurements: Height: 6' (182.9 cm) Weight: 208 lb 12.4 oz (94.7 kg) IBW/kg (Calculated) : 77.6  Vital Signs: Temp: 97 F (36.1 C) (07/22 0800) Temp src: Core (Comment) (07/22 0700) BP: 167/95 mmHg (07/22 0854) Pulse Rate: 85 (07/22 0854)  Labs:  Recent Labs  12/16/2013 1842 12/24/2013 1916 12/15/2013 2105 01/04/14 0315 01/04/14 0745 01/04/14 1250 01/05/14 0226  HGB 12.6* 14.3  --  14.7  --   --  13.4  HCT 40.2 42.0  --  44.4  --   --  39.8  PLT 187  --   --  127*  --   --  138*  APTT 34  --   --   --   --   --   --   LABPROT 28.4*  --   --   --   --  29.9* 33.0*  INR 2.67*  --   --   --   --  2.85* 3.23*  CREATININE 2.35* 2.60*  --  2.17*  --   --  2.66*  TROPONINI  --   --  <0.30 <0.30 <0.30  --   --     Estimated Creatinine Clearance: 34.4 ml/min (by C-G formula based on Cr of 2.66).  Assessment: 63 y.o. male who presented on 7/20 with cardiac arrest/intentional suicide attempt. Patient found with suicide note and 2 restoril bottles by him.   INR has trended up this morning and is above goal likely due to shock liver and current illness. No bleeding noted, H/H are stable, platelets are trending down.  Per warfarin clinic patient is to take 7.5 mg daily except 10 mg on Wed, Sat. Unsure of exact last dose at home.  Goal of Therapy:  INR 2-3 Monitor platelets by anticoagulation protocol: Yes   Plan:  - No warfarin tonight - INR daily - Will resume once INR trends downward  [facility], [place].D., BCPS Clinical Pharmacist Pager: [phone] 01/05/2014 10:49 AM

## 2014-01-05 NOTE — Telephone Encounter (Signed)
Message received the patient is on life support following an overdose. I will go by and see the patient after  work today.

## 2014-01-06 ENCOUNTER — Inpatient Hospital Stay (HOSPITAL_COMMUNITY): Payer: 59

## 2014-01-06 LAB — CBC
HEMATOCRIT: 34.8 % — AB (ref 39.0–52.0)
Hemoglobin: 11.4 g/dL — ABNORMAL LOW (ref 13.0–17.0)
MCH: 30.1 pg (ref 26.0–34.0)
MCHC: 32.8 g/dL (ref 30.0–36.0)
MCV: 91.8 fL (ref 78.0–100.0)
Platelets: 135 10*3/uL — ABNORMAL LOW (ref 150–400)
RBC: 3.79 MIL/uL — AB (ref 4.22–5.81)
RDW: 15.4 % (ref 11.5–15.5)
WBC: 4.3 10*3/uL (ref 4.0–10.5)

## 2014-01-06 LAB — GLUCOSE, CAPILLARY
GLUCOSE-CAPILLARY: 151 mg/dL — AB (ref 70–99)
Glucose-Capillary: 109 mg/dL — ABNORMAL HIGH (ref 70–99)
Glucose-Capillary: 141 mg/dL — ABNORMAL HIGH (ref 70–99)
Glucose-Capillary: 142 mg/dL — ABNORMAL HIGH (ref 70–99)
Glucose-Capillary: 144 mg/dL — ABNORMAL HIGH (ref 70–99)

## 2014-01-06 LAB — BASIC METABOLIC PANEL
Anion gap: 13 (ref 5–15)
BUN: 43 mg/dL — AB (ref 6–23)
CHLORIDE: 113 meq/L — AB (ref 96–112)
CO2: 19 mEq/L (ref 19–32)
CREATININE: 2.26 mg/dL — AB (ref 0.50–1.35)
Calcium: 8.5 mg/dL (ref 8.4–10.5)
GFR calc Af Amer: 34 mL/min — ABNORMAL LOW (ref >90)
GFR calc non Af Amer: 29 mL/min — ABNORMAL LOW (ref >90)
GLUCOSE: 145 mg/dL — AB (ref 70–99)
POTASSIUM: 4.3 meq/L (ref 3.7–5.3)
Sodium: 145 mEq/L (ref 137–147)

## 2014-01-06 LAB — PHENOBARBITAL LEVEL: Phenobarbital: 8.6 ug/mL — ABNORMAL LOW (ref 15.0–40.0)

## 2014-01-06 LAB — CULTURE, RESPIRATORY W GRAM STAIN

## 2014-01-06 LAB — PROTIME-INR
INR: 1.5 — ABNORMAL HIGH (ref 0.00–1.49)
Prothrombin Time: 18.1 seconds — ABNORMAL HIGH (ref 11.6–15.2)

## 2014-01-06 MED ORDER — MORPHINE BOLUS VIA INFUSION
5.0000 mg | INTRAVENOUS | Status: DC | PRN
  Filled 2014-01-06: qty 20

## 2014-01-06 MED ORDER — MIDAZOLAM BOLUS VIA INFUSION
5.0000 mg | INTRAVENOUS | Status: DC | PRN
  Filled 2014-01-06: qty 20

## 2014-01-06 MED ORDER — SODIUM CHLORIDE 0.9 % IV SOLN
10.0000 mg/h | INTRAVENOUS | Status: DC
  Administered 2014-01-07: 10 mg/h via INTRAVENOUS
  Filled 2014-01-06 (×3): qty 10

## 2014-01-06 MED ORDER — AMLODIPINE BESYLATE 5 MG PO TABS
5.0000 mg | ORAL_TABLET | Freq: Every day | ORAL | Status: DC
  Administered 2014-01-06: 5 mg via ORAL
  Filled 2014-01-06: qty 1

## 2014-01-06 MED ORDER — WARFARIN SODIUM 10 MG PO TABS
10.0000 mg | ORAL_TABLET | Freq: Once | ORAL | Status: DC
  Filled 2014-01-06: qty 1

## 2014-01-06 MED ORDER — LABETALOL HCL 5 MG/ML IV SOLN
20.0000 mg | INTRAVENOUS | Status: DC | PRN
  Administered 2014-01-06 – 2014-01-07 (×2): 20 mg via INTRAVENOUS
  Filled 2014-01-06 (×2): qty 4

## 2014-01-06 MED ORDER — MORPHINE SULFATE 10 MG/ML IJ SOLN
10.0000 mg/h | INTRAMUSCULAR | Status: DC
  Administered 2014-01-07: 10 mg/h via INTRAVENOUS
  Filled 2014-01-06 (×3): qty 10

## 2014-01-06 NOTE — ED Provider Notes (Signed)
CSN: [id number]     Arrival date & time 12/25/2013  1833 History   First MD Initiated Contact with Patient 12/20/2013 1848     Chief Complaint  Patient presents with  . Cardiac Arrest     (Consider location/radiation/quality/duration/timing/severity/associated sxs/prior Treatment) Patient is a 63 y.o. male presenting with altered mental status.  Altered Mental Status Presenting symptoms: unresponsiveness   Severity:  Severe Most recent episode:  Today Episode history:  Single Timing:  Constant Chronicity:  New Associated symptoms: suicidal behavior     Past Medical History  Diagnosis Date  . Renal cell carcinoma     chemo and xrt--lost rt kidney  . Childhood asthma   . Sinusitis   . Cellulitis     after left foot surgery  . Osteoarthritis   . OSA on CPAP     NPSG 05-08-07 AHI 63.1/hr,desat t0 80%/loud snoring  . Bipolar 1 disorder   . Hyperlipemia   . Mental disorder   . Depression   . GERD (gastroesophageal reflux disease)   . Hypertension   . Allergy   . Anxiety   . Cataract   . Neuromuscular disorder   . Osteoporosis   . Thyroid disease   . DVT (deep venous thrombosis)     on coumadin (12/2013)  . Cardiac arrest 01/05/2014  . Suicide attempt by adequate means 01/10/2014   Past Surgical History  Procedure Laterality Date  . Foot surgery      left foot toes,   . Bunionectomy    . 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 Cholecystectomy    . Colon surgery    . Kidney surgery  1998    Rt Kidney Removal    Family History  Problem Relation Age of Onset  . Hypertension    . Sleep apnea    . Emphysema    . Prostate cancer    . Arthritis Father   . Colon cancer Father    History  Substance Use Topics  . Smoking status: Never Smoker   . Smokeless tobacco: Never Used  . Alcohol Use: Yes     Comment: occasional beer    Review of Systems  Unable to perform ROS     Allergies  Review of patient's allergies indicates no known allergies.  Home  Medications   Prior to Admission medications   Medication Sig Start Date End Date Taking? Authorizing Provider  amLODipine (NORVASC) 5 MG tablet Take 1 tablet (5 mg total) by mouth daily. 09/29/13  Yes [name], MD  divalproex (DEPAKOTE ER) 250 MG 24 hr tablet Take 3 tablets (750 mg total) by mouth every 12 (twelve) hours. 08/02/13  Yes [name], DO  docusate sodium (COLACE) 100 MG capsule Take 100 mg by mouth 2 (two) times daily.   Yes Historical Provider, MD  lansoprazole (PREVACID) 30 MG capsule Take 1 capsule (30 mg total) by mouth daily at 12 noon. 10/04/13  Yes [name], MD  levothyroxine (SYNTHROID, LEVOTHROID) 50 MCG tablet Take 1 tablet (50 mcg total) by mouth daily before breakfast. 10/21/13  Yes [name], PA-C  lithium carbonate 150 MG capsule Take 150 mg by mouth at bedtime.   Yes Historical Provider, MD  Lurasidone HCl (LATUDA) 20 MG TABS Take 20 mg by mouth daily.    Yes Historical Provider, MD  Melatonin 3 MG CAPS Take 3 mg by mouth at bedtime.    Yes Historical Provider, MD  mirtazapine (REMERON)  30 MG tablet Take 30 mg by mouth at bedtime.   Yes Historical Provider, MD  NON FORMULARY 1 each by Other route See admin instructions. Uses a CPAP machine nightly.   Yes Historical Provider, MD  primidone (MYSOLINE) 50 MG tablet Take 250 mg by mouth at bedtime.    Yes Historical Provider, MD  sennosides-docusate sodium (SENOKOT-S) 8.6-50 MG tablet Take 1 tablet by mouth 2 (two) times daily.    Yes Historical Provider, MD  tamsulosin (FLOMAX) 0.4 MG CAPS capsule Take 1 capsule (0.4 mg total) by mouth daily after breakfast. 10/04/13  Yes [name], MD  temazepam (RESTORIL) 15 MG capsule Take 15 mg by mouth at bedtime as needed for sleep.   Yes Historical Provider, MD  warfarin (COUMADIN) 5 MG tablet Take 7.5-10 mg by mouth daily. Take 7.5mg  by mouth daily except 10mg  by mouth on Wednesday and Saturday.   Yes Historical Provider, MD   BP 150/79  Pulse 83  Temp(Src)  99.1 F (37.3 C) (Core (Comment))  Resp 16  Ht 6' (1.829 m)  Wt 208 lb 12.4 oz (94.7 kg)  BMI 28.31 kg/m2  SpO2 100% Physical Exam  Nursing note and vitals reviewed. Constitutional: He appears well-developed and well-nourished.  HENT:  Head: Normocephalic.  Eyes:  Pupils dilated and fixed  Neck: Neck supple.  Cardiovascular: Normal rate and regular rhythm.  Exam reveals no gallop and no friction rub.   No murmur heard. Pulmonary/Chest:  Patient exhibits no respiratory drive. Breath oh two quarters bilateral  Abdominal: Soft. He exhibits no distension. There is no tenderness.  Musculoskeletal: He exhibits no edema.  Neurological:  GCS of 3  Skin: Skin is warm.  Psychiatric: He has a normal mood and affect.    ED Course  Procedures (including critical care time) Labs Review Labs Reviewed  CBC WITH DIFFERENTIAL - Abnormal; Notable for the following:    RBC 4.12 (*)    Hemoglobin 12.6 (*)    All other components within normal limits  COMPREHENSIVE METABOLIC PANEL - Abnormal; Notable for the following:    Glucose, Bld 294 (*)    BUN 33 (*)    Creatinine, Ser 2.35 (*)    Albumin 2.9 (*)    AST 169 (*)    ALT 134 (*)    Total Bilirubin <0.2 (*)    GFR calc non Af Amer 28 (*)    GFR calc Af Amer 32 (*)    Anion gap 22 (*)    All other components within normal limits  LITHIUM LEVEL - Abnormal; Notable for the following:    Lithium Lvl <0.25 (*)    All other components within normal limits  SALICYLATE LEVEL - Abnormal; Notable for the following:    Salicylate Lvl <9.8 (*)    All other components within normal limits  PROTIME-INR - Abnormal; Notable for the following:    Prothrombin Time 28.4 (*)    INR 2.67 (*)    All other components within normal limits  URINALYSIS, ROUTINE W REFLEX MICROSCOPIC - Abnormal; Notable for the following:    APPearance CLOUDY (*)    Glucose, UA 100 (*)    Hgb urine dipstick MODERATE (*)    Protein, ur 100 (*)    All other components  within normal limits  URINE RAPID DRUG SCREEN (HOSP PERFORMED) - Abnormal; Notable for the following:    Benzodiazepines POSITIVE (*)    Barbiturates POSITIVE (*)    All other components within normal limits  URINE MICROSCOPIC-ADD ON - Abnormal; Notable for the following:    Squamous Epithelial / LPF FEW (*)    All other components within normal limits  CBC - Abnormal; Notable for the following:    Platelets 127 (*)    All other components within normal limits  BASIC METABOLIC PANEL - Abnormal; Notable for the following:    CO2 17 (*)    Glucose, Bld 147 (*)    BUN 34 (*)    Creatinine, Ser 2.17 (*)    GFR calc non Af Amer 31 (*)    GFR calc Af Amer 36 (*)    Anion gap 22 (*)    All other components within normal limits  BLOOD GAS, ARTERIAL - Abnormal; Notable for the following:    pH, Arterial 7.491 (*)    pCO2 arterial 24.9 (*)    pO2, Arterial 140.0 (*)    Bicarbonate 19.3 (*)    Acid-base deficit 3.9 (*)    All other components within normal limits  HEPATIC FUNCTION PANEL - Abnormal; Notable for the following:    Albumin 3.0 (*)    AST 207 (*)    ALT 139 (*)    Total Bilirubin <0.2 (*)    All other components within normal limits  LACTIC ACID, PLASMA - Abnormal; Notable for the following:    Lactic Acid, Venous 6.1 (*)    All other components within normal limits  LACTIC ACID, PLASMA - Abnormal; Notable for the following:    Lactic Acid, Venous 5.7 (*)    All other components within normal limits  VALPROIC ACID LEVEL - Abnormal; Notable for the following:    Valproic Acid Lvl 48.6 (*)    All other components within normal limits  GLUCOSE, CAPILLARY - Abnormal; Notable for the following:    Glucose-Capillary 157 (*)    All other components within normal limits  LACTIC ACID, PLASMA - Abnormal; Notable for the following:    Lactic Acid, Venous 3.6 (*)    All other components within normal limits  GLUCOSE, CAPILLARY - Abnormal; Notable for the following:     Glucose-Capillary 134 (*)    All other components within normal limits  GLUCOSE, CAPILLARY - Abnormal; Notable for the following:    Glucose-Capillary 111 (*)    All other components within normal limits  GLUCOSE, CAPILLARY - Abnormal; Notable for the following:    Glucose-Capillary 108 (*)    All other components within normal limits  PROTIME-INR - Abnormal; Notable for the following:    Prothrombin Time 29.9 (*)    INR 2.85 (*)    All other components within normal limits  GLUCOSE, CAPILLARY - Abnormal; Notable for the following:    Glucose-Capillary 114 (*)    All other components within normal limits  CBC - Abnormal; Notable for the following:    Platelets 138 (*)    All other components within normal limits  BASIC METABOLIC PANEL - Abnormal; Notable for the following:    CO2 18 (*)    Glucose, Bld 138 (*)    BUN 42 (*)    Creatinine, Ser 2.66 (*)    GFR calc non Af Amer 24 (*)    GFR calc Af Amer 28 (*)    Anion gap 17 (*)    All other components within normal limits  PROTIME-INR - Abnormal; Notable for the following:    Prothrombin Time 33.0 (*)    INR 3.23 (*)    All other components within normal  limits  GLUCOSE, CAPILLARY - Abnormal; Notable for the following:    Glucose-Capillary 131 (*)    All other components within normal limits  GLUCOSE, CAPILLARY - Abnormal; Notable for the following:    Glucose-Capillary 155 (*)    All other components within normal limits  GLUCOSE, CAPILLARY - Abnormal; Notable for the following:    Glucose-Capillary 148 (*)    All other components within normal limits  GLUCOSE, CAPILLARY - Abnormal; Notable for the following:    Glucose-Capillary 149 (*)    All other components within normal limits  HEPATIC FUNCTION PANEL - Abnormal; Notable for the following:    Albumin 2.6 (*)    AST 126 (*)    ALT 76 (*)    Total Bilirubin <0.2 (*)    All other components within normal limits  GLUCOSE, CAPILLARY - Abnormal; Notable for the  following:    Glucose-Capillary 151 (*)    All other components within normal limits  GLUCOSE, CAPILLARY - Abnormal; Notable for the following:    Glucose-Capillary 151 (*)    All other components within normal limits  GLUCOSE, CAPILLARY - Abnormal; Notable for the following:    Glucose-Capillary 127 (*)    All other components within normal limits  GLUCOSE, CAPILLARY - Abnormal; Notable for the following:    Glucose-Capillary 136 (*)    All other components within normal limits  GLUCOSE, CAPILLARY - Abnormal; Notable for the following:    Glucose-Capillary 144 (*)    All other components within normal limits  I-STAT CG4 LACTIC ACID, ED - Abnormal; Notable for the following:    Lactic Acid, Venous 7.06 (*)    All other components within normal limits  I-STAT CHEM 8, ED - Abnormal; Notable for the following:    BUN 39 (*)    Creatinine, Ser 2.60 (*)    Glucose, Bld 306 (*)    All other components within normal limits  I-STAT ARTERIAL BLOOD GAS, ED - Abnormal; Notable for the following:    pH, Arterial 7.156 (*)    pCO2 arterial 53.2 (*)    pO2, Arterial 147.0 (*)    Bicarbonate 18.8 (*)    Acid-base deficit 10.0 (*)    All other components within normal limits  I-STAT ARTERIAL BLOOD GAS, ED - Abnormal; Notable for the following:    pCO2 arterial 22.8 (*)    pO2, Arterial 62.0 (*)    Bicarbonate 15.6 (*)    Acid-base deficit 8.0 (*)    All other components within normal limits  POCT I-STAT 3, ART BLOOD GAS (G3+) - Abnormal; Notable for the following:    pH, Arterial 7.452 (*)    pCO2 arterial 27.5 (*)    pO2, Arterial 124.0 (*)    Bicarbonate 19.6 (*)    Acid-base deficit 4.0 (*)    All other components within normal limits  MRSA PCR SCREENING  CULTURE, RESPIRATORY (NON-EXPECTORATED)  CULTURE, RESPIRATORY (NON-EXPECTORATED)  ACETAMINOPHEN LEVEL  MAGNESIUM  ETHANOL  APTT  TROPONIN I  TROPONIN I  TROPONIN I  AMMONIA  TSH  PROCALCITONIN  PROCALCITONIN  GLUCOSE,  CAPILLARY  PROCALCITONIN  PROTIME-INR  CBC  BASIC METABOLIC PANEL  I-STAT TROPOININ, ED    Imaging Review Dg Chest Port 1 View  01/05/2014   CLINICAL DATA:  Assess endotracheal tube  EXAM: PORTABLE CHEST - 1 VIEW  COMPARISON:  Prior chest x-ray 01/04/2014  FINDINGS: The endotracheal tube is 2.5 cm above the carina. The nasogastric tube is incompletely imaged. The tip  lies off the field of view, below the diaphragm and likely within the stomach. External defibrillator pad projects over the upper chest. Cardiac and mediastinal contours remain unchanged. Decreasing aeration in the lung bases over the last 24 hr. Likely new layering pleural effusion on the right with associated right basilar atelectasis. The left lung is relatively clear despite the lower inspiratory volumes and mild subsegmental atelectasis. No overt pulmonary edema. Osseous structures intact and unremarkable for age.  IMPRESSION: 1. New veil like opacity in the right lung base most consistent with a developing layering pleural effusion and superimposed atelectasis. In the appropriate clinical setting, aspiration could have a similar appearance. 2. Slightly lower inspiratory volumes. 3. Stable subsegmental atelectasis in the left lung base. 4. Stable and satisfactory support apparatus.   Electronically Signed   By: Jacqulynn Cadet M.D.   On: 01/05/2014 07:33   Dg Chest Port 1 View  01/04/2014   CLINICAL DATA:  Intubation.  EXAM: PORTABLE CHEST - 1 VIEW  COMPARISON:  01/10/2014  FINDINGS: Endotracheal tube noted 2.6 cm above the carina. NG tube noted with tip below the left hemidiaphragm. Mild basilar atelectasis. No pleural effusion or pneumothorax. Heart size and pulmonary vascularity normal. No acute osseus abnormality.  IMPRESSION: 1. Endotracheal tube noted 2.6 cm above the carina. NG tube below left hemidiaphragm. 2. Mild bibasilar atelectasis.   Electronically Signed   By: Marcello Moores  Register   On: 01/04/2014 07:29     EKG  Interpretation   Date/Time:  Monday January 03 2014 18:37:53 EDT Ventricular Rate:  113 PR Interval:  145 QRS Duration: 88 QT Interval:  353 QTC Calculation: 484 R Axis:   -10 Text Interpretation:  Sinus tachycardia Abnormal R-wave progression, early  transition Borderline repolarization abnormality Borderline prolonged QT  interval Confirmed by Reather Converse  MD, JOSHUA ([id number]) on 01/05/2014 6:54:20 PM      MDM   Final diagnoses:  Cardiac arrest  Acute respiratory failure, unspecified whether with hypoxia or hypercapnia  Bipolar 1 disorder, mixed  Anoxic brain injury   63 year old male with past medical history of bipolar disease that was found unresponsive by his wife. Per EMS there was a note found and suspected that the patient overdosed on benzo medications. Currently the patient does not have a respiratory drive to have a GCS of 3 his pupils are fixed and dilated and thick secretions noted with coarse breath sounds. Patient's past medical history was reviewed his current medication list was noted. Patient has access to multiple medications including amlodipine, Coumadin. It was noted that in the field the patient received Narcan, glucagon, without significant improvement. Patient continued to be hemodynamically stable and on the ventilator. Patient noted to be notably acidotic for unknown reasons. Likely ingestion. CT head unremarkable. Chest x-ray demonstrates tube is in the appropriate location. Images independently reviewed. Long discussion was had with the family who states that the patient was looking forward to a beach trip, wife is shocked by the thought of a suicide attempt. Critical care was informed the patient and her spine for further care    Claudean Severance, MD 01/06/14 [phone]

## 2014-01-06 NOTE — Progress Notes (Signed)
Extensive discussion with family. We discussed the poor prognosis and likely poor quality of life. Family has decided to offer full comfort care. They are aware that the patient may be transferred to palliative care floor for continued comfort care needs. They have been fully updated on the process and expectations.  Doree Fudge, MD [facility] [place] Pager: [phone]

## 2014-01-06 NOTE — Consult Note (Signed)
NEURO HOSPITALIST CONSULT NOTE    Reason for Consult: prognostication  HPI:                                                                                                                                          [name] is an 63 y.o. male with PMH as below which includes renal CA (with loss of R kidney), OSA on CPAP at home (followed by [facility]), Bipolar and other psychiatric history. Recently he had seemed to be managing his bipolar disorder well, was looking forward to trip at beach soon. Found down at home by his wife ~5pm. Last contact with him was likely around noon when he usually talks to his mother. Police states that suicide note was found at scene, as well as bottles of risperdal. Upon EMS arrival he was without pulses, he was given several rounds of Epi, narcan, and glucagon with ROSC. He has been unresponsive since that time. In ED head CT is negative for any acute abnormality. Patient has remained unresponsive while in hospital. MRI obtained on 7/23 showed to be compatible with severe a anoxic injury; diffuse cortical, deep gray matter, and cerebellar infarcts with edema. EEG obtained 7/22 showed markedly abnormal with findings consistent with severe diffuse encephalopathy with a pattern of burst suppression with very minimal detectable brain activity during bursts. Findings are consistent with severe diffuse anoxic brain injury. No evidence of epileptic activity was seen.  Neurology was asked to see for prognostication.    Past Medical History  Diagnosis Date  . Renal cell carcinoma     chemo and xrt--lost rt kidney  . Childhood asthma   . Sinusitis   . Cellulitis     after left foot surgery  . Osteoarthritis   . OSA on CPAP     NPSG 05-08-07 AHI 63.1/hr,desat t0 80%/loud snoring  . Bipolar 1 disorder   . Hyperlipemia   . Mental disorder   . Depression   . GERD (gastroesophageal reflux disease)   . Hypertension   . Allergy   . Anxiety   . Cataract   .  Neuromuscular disorder   . Osteoporosis   . Thyroid disease   . DVT (deep venous thrombosis)     on coumadin (12/2013)  . Cardiac arrest 01/12/2014  . Suicide attempt by adequate means 12/28/2013    Past Surgical History  Procedure Laterality Date  . Foot surgery      left foot toes,   . Bunionectomy    . 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 Cholecystectomy    . Colon surgery    . Kidney surgery  1998    Rt Kidney Removal     Family History  Problem Relation Age of Onset  .  Hypertension    . Sleep apnea    . Emphysema    . Prostate cancer    . Arthritis Father   . Colon cancer Father     Social History:  reports that he has never smoked. He has never used smokeless tobacco. He reports that he drinks alcohol. He reports that he does not use illicit drugs.  No Known Allergies  MEDICATIONS:                                                                                                                     Prior to Admission:  Prescriptions prior to admission  Medication Sig Dispense Refill  . amLODipine (NORVASC) 5 MG tablet Take 1 tablet (5 mg total) by mouth daily.  30 tablet  4  . divalproex (DEPAKOTE ER) 250 MG 24 hr tablet Take 3 tablets (750 mg total) by mouth every 12 (twelve) hours.      . docusate sodium (COLACE) 100 MG capsule Take 100 mg by mouth 2 (two) times daily.      . lansoprazole (PREVACID) 30 MG capsule Take 1 capsule (30 mg total) by mouth daily at 12 noon.  30 capsule  11  . levothyroxine (SYNTHROID, LEVOTHROID) 50 MCG tablet Take 1 tablet (50 mcg total) by mouth daily before breakfast.  90 tablet  1  . lithium carbonate 150 MG capsule Take 150 mg by mouth at bedtime.      . Lurasidone HCl (LATUDA) 20 MG TABS Take 20 mg by mouth daily.       . Melatonin 3 MG CAPS Take 3 mg by mouth at bedtime.       . mirtazapine (REMERON) 30 MG tablet Take 30 mg by mouth at bedtime.      . NON FORMULARY 1 each by Other route See admin instructions. Uses a CPAP  machine nightly.      . primidone (MYSOLINE) 50 MG tablet Take 250 mg by mouth at bedtime.       . sennosides-docusate sodium (SENOKOT-S) 8.6-50 MG tablet Take 1 tablet by mouth 2 (two) times daily.       . tamsulosin (FLOMAX) 0.4 MG CAPS capsule Take 1 capsule (0.4 mg total) by mouth daily after breakfast.  30 capsule  11  . temazepam (RESTORIL) 15 MG capsule Take 15 mg by mouth at bedtime as needed for sleep.      Marland Kitchen warfarin (COUMADIN) 5 MG tablet Take 7.5-10 mg by mouth daily. Take 7.5mg  by mouth daily except 10mg  by mouth on Wednesday and Saturday.       Scheduled: . amLODipine  5 mg Oral Daily  . ampicillin-sulbactam (UNASYN) IV  1.5 g Intravenous Q6H  . antiseptic oral rinse  15 mL Mouth Rinse QID  . chlorhexidine  15 mL Mouth Rinse BID  . insulin aspart  0-15 Units Subcutaneous 6 times per day  . levothyroxine  50 mcg Oral QAC breakfast  . pantoprazole sodium  40 mg Per Tube QHS  .  warfarin  10 mg Oral ONCE-1800  . Warfarin - Pharmacist Dosing Inpatient   Does not apply q1800     ROS:                                                                                                                                       History obtained from unobtainable from patient due to mental status     Blood pressure 144/90, pulse 88, temperature 97.7 F (36.5 C), temperature source Core (Comment), resp. rate 12, height 6' (1.829 m), weight 96.6 kg (212 lb 15.4 oz), SpO2 92.00%.   Neurologic Examination:                                                                                                      Mental Status: Patient does not respond to verbal stimuli.  Does not respond to deep sternal rub.  Does not follow commands.  No verbalizations noted.  Cranial Nerves: II: patient does not respond confrontation bilaterally, pupils right 2 mm, left 2 mm,and reactive bilaterally III,IV,VI: doll's response present.  V,VII: corneal reflex absent bilaterally  VIII: patient does not respond to  verbal stimuli IX,X: gag reflex absent, XI: trapezius strength unable to test bilaterally XII: tongue strength unable to test Motor: Extremities flaccid throughout.  No spontaneous movement noted.  No purposeful movements noted. Sensory: Does not respond to noxious stimuli in any extremity. Deep Tendon Reflexes:  2+ bilateral UE and none in LE Plantars: absent bilaterally Cerebellar: Unable to perform    Lab Results: Basic Metabolic Panel:  Recent Labs Lab 01/12/2014 1842 01/05/2014 1916 01/04/14 0315 01/05/14 0226 01/06/14 0215  NA 141 141 141 141 145  K 4.2 4.0 4.7 4.7 4.3  CL 100 103 102 106 113*  CO2 19  --  17* 18* 19  GLUCOSE 294* 306* 147* 138* 145*  BUN 33* 39* 34* 42* 43*  CREATININE 2.35* 2.60* 2.17* 2.66* 2.26*  CALCIUM 8.4  --  8.4 8.9 8.5  MG 2.1  --   --   --   --     Liver Function Tests:  Recent Labs Lab 12/21/2013 1842 01/04/14 0315 01/05/14 0950  AST 169* 207* 126*  ALT 134* 139* 76*  ALKPHOS 97 80 73  BILITOT <0.2* <0.2* <0.2*  PROT 6.4 6.7 6.5  ALBUMIN 2.9* 3.0* 2.6*   No results found for this basename: LIPASE, AMYLASE,  in the last 168 hours  Recent Labs Lab 01/13/2014 2230  AMMONIA 22  CBC:  Recent Labs Lab 12/26/2013 1842 01/02/2014 1916 01/04/14 0315 01/05/14 0226 01/06/14 0215  WBC 8.9  --  5.4 6.8 4.3  NEUTROABS 6.5  --   --   --   --   HGB 12.6* 14.3 14.7 13.4 11.4*  HCT 40.2 42.0 44.4 39.8 34.8*  MCV 97.6  --  93.7 92.6 91.8  PLT 187  --  127* 138* 135*    Cardiac Enzymes:  Recent Labs Lab 01/10/2014 2105 01/04/14 0315 01/04/14 0745  TROPONINI <0.30 <0.30 <0.30    Lipid Panel: No results found for this basename: CHOL, TRIG, HDL, CHOLHDL, VLDL, LDLCALC,  in the last 168 hours  CBG:  Recent Labs Lab 01/05/14 1949 01/05/14 2337 01/06/14 0340 01/06/14 0735 01/06/14 1234  GLUCAP 136* 144* 141* 142* 109*    Microbiology: Results for orders placed during the hospital encounter of 12/27/2013  MRSA PCR  SCREENING     Status: None   Collection Time    01/02/2014 10:08 PM      Result Value Ref Range Status   MRSA by PCR NEGATIVE  NEGATIVE Final   Comment:            The GeneXpert MRSA Assay (FDA     approved for NASAL specimens     only), is one component of a     comprehensive MRSA colonization     surveillance program. It is not     intended to diagnose MRSA     infection nor to guide or     monitor treatment for     MRSA infections.  CULTURE, RESPIRATORY (NON-EXPECTORATED)     Status: None   Collection Time    01/04/14  9:25 AM      Result Value Ref Range Status   Specimen Description TRACHEAL ASPIRATE   Final   Special Requests NONE   Final   Gram Stain     Final   Value: MODERATE WBC PRESENT,BOTH PMN AND MONONUCLEAR     RARE SQUAMOUS EPITHELIAL CELLS PRESENT     FEW GRAM POSITIVE COCCI IN PAIRS     IN CLUSTERS FEW GRAM POSITIVE RODS     RARE GRAM NEGATIVE RODS   Culture     Final   Value: Non-Pathogenic Oropharyngeal-type Flora Isolated.     Performed at [facility]   Report Status 01/06/2014 FINAL   Final  CULTURE, RESPIRATORY (NON-EXPECTORATED)     Status: None   Collection Time    01/04/14  9:30 PM      Result Value Ref Range Status   Specimen Description TRACHEAL ASPIRATE   Final   Special Requests NONE   Final   Gram Stain     Final   Value: MODERATE WBC PRESENT,BOTH PMN AND MONONUCLEAR     RARE SQUAMOUS EPITHELIAL CELLS PRESENT     MODERATE GRAM POSITIVE COCCI IN PAIRS     IN CLUSTERS FEW GRAM POSITIVE RODS     FEW GRAM NEGATIVE RODS   Culture     Final   Value: Culture reincubated for better growth     Performed at [facility]   Report Status PENDING   Incomplete    Coagulation Studies:  Recent Labs  01/04/2014 1842 01/04/14 1250 01/05/14 0226 01/06/14 0215  LABPROT 28.4* 29.9* 33.0* 18.1*  INR 2.67* 2.85* 3.23* 1.50*    Imaging: Mr Kizzie Fantasia Contrast  01/06/2014   CLINICAL DATA:  63 year old male found down, unresponsive,  remains obtunded. Query  anoxia. History of renal cell carcinoma, blood clots. Initial encounter.  EXAM: MRI HEAD WITHOUT AND WITH CONTRAST  TECHNIQUE: Multiplanar, multiecho pulse sequences of the brain and surrounding structures were obtained without and with intravenous contrast.  CONTRAST:  20 mL MultiHance.  COMPARISON:  Head CT without contrast 12/17/2013. Brain MRI 07/27/2013.  FINDINGS: Diffusely abnormal gray matter diffusion, with widespread diffusion restriction in a symmetric pattern affecting both cerebral and cerebellar hemispheres. Cerebral white matter and the brainstem are relatively spared.  Associated diffuse severe cortical edema with T2 and FLAIR hyperintensity. Similar widespread deep gray matter nuclei and cerebellar hemisphere edema. Subsequent mass effect with compression on the ventricular system, and partial loss of basilar cisterns.  No acute intracranial hemorrhage identified. Major intracranial vascular flow voids are stable. No abnormal enhancement identified. No discrete intracranial mass lesion. Negative pituitary, cervicomedullary junction and visualized cervical spine. Normal bone marrow signal. Stable orbits soft tissues except for MRI evidence of papilledema.  Intubated, with mastoid effusions, paranasal sinus fluid, and fluid in the pharynx. Visualized scalp soft tissues are within normal limits.  IMPRESSION: Appearance compatible with severe a anoxic injury; diffuse cortical, deep gray matter, and cerebellar infarcts with edema.   Electronically Signed   By: Lars Pinks M.D.   On: 01/06/2014 13:29   Dg Chest Port 1 View  01/06/2014   CLINICAL DATA:  Infiltrates.  EXAM: PORTABLE CHEST - 1 VIEW  COMPARISON:  01/05/2014.  FINDINGS: The cardiopericardial silhouette is unchanged. Endotracheal tube remains present with the tip 18 mm from the carina. Enteric tube present. Patient is mildly rotated to the RIGHT there is airspace disease around the RIGHT hilum and RIGHT infrahilar  region which appears similar to prior exam. Probable RIGHT pleural effusion. Defibrillator pads have been removed. Cholecystectomy clips are present in the right upper quadrant. Monitoring leads project over the chest.  IMPRESSION: 1. Stable support apparatus. 2. RIGHT basilar atelectasis, perihilar airspace disease and probable small pleural effusion.   Electronically Signed   By: Dereck Ligas M.D.   On: 01/06/2014 07:13   Dg Chest Port 1 View  01/05/2014   CLINICAL DATA:  Assess endotracheal tube  EXAM: PORTABLE CHEST - 1 VIEW  COMPARISON:  Prior chest x-ray 01/04/2014  FINDINGS: The endotracheal tube is 2.5 cm above the carina. The nasogastric tube is incompletely imaged. The tip lies off the field of view, below the diaphragm and likely within the stomach. External defibrillator pad projects over the upper chest. Cardiac and mediastinal contours remain unchanged. Decreasing aeration in the lung bases over the last 24 hr. Likely new layering pleural effusion on the right with associated right basilar atelectasis. The left lung is relatively clear despite the lower inspiratory volumes and mild subsegmental atelectasis. No overt pulmonary edema. Osseous structures intact and unremarkable for age.  IMPRESSION: 1. New veil like opacity in the right lung base most consistent with a developing layering pleural effusion and superimposed atelectasis. In the appropriate clinical setting, aspiration could have a similar appearance. 2. Slightly lower inspiratory volumes. 3. Stable subsegmental atelectasis in the left lung base. 4. Stable and satisfactory support apparatus.   Electronically Signed   By: Jacqulynn Cadet M.D.   On: 01/05/2014 07:33    Etta Quill PA-C [facility] [phone]  01/06/2014, 2:05 PM   Assessment/Plan: 63 YO male found down by family. Patient was last spoken to 5 hours prior to being found. Currently has doll's, pupillary, but no corneal, no gag and not breathing over  the vent.  Clinical findings as well as EEG and MRI results are consistent with severe global anoxic brain injury. Prognosis is very poor for any meaningful degree of recovery.  Patient's findings and prognosis were discussed with the patient's spouse, mother and brother. They indicated that the patient would not want to exist in his current state if his condition were to reversible, and that they wanted to speak with the patient's sister who lives out of state prior to making final decision of when to discontinue life support measures.  No further neurodiagnostic studies are indicated at this point, nor further neurological intervention. We will remain available for followup and further consultation with family if needed.  Rush Farmer M.D. [facility] [phone]

## 2014-01-06 NOTE — Progress Notes (Signed)
ANTICOAGULATION CONSULT NOTE - Follow Up Consult  Pharmacy Consult for warfarin Indication: hx DVT  No Known Allergies  Patient Measurements: Height: 6' (182.9 cm) Weight: 212 lb 15.4 oz (96.6 kg) IBW/kg (Calculated) : 77.6  Vital Signs: Temp: 98.2 F (36.8 C) (07/23 0600) Temp src: Core (Comment) (07/23 0600) BP: 162/84 mmHg (07/23 0856) Pulse Rate: 75 (07/23 0856)  Labs:  Recent Labs  12/19/2013 1842  12/29/2013 2105 01/04/14 0315 01/04/14 0745 01/04/14 1250 01/05/14 0226 01/06/14 0215  HGB 12.6*  < >  --  14.7  --   --  13.4 11.4*  HCT 40.2  < >  --  44.4  --   --  39.8 34.8*  PLT 187  --   --  127*  --   --  138* 135*  APTT 34  --   --   --   --   --   --   --   LABPROT 28.4*  --   --   --   --  29.9* 33.0* 18.1*  INR 2.67*  --   --   --   --  2.85* 3.23* 1.50*  CREATININE 2.35*  < >  --  2.17*  --   --  2.66* 2.26*  TROPONINI  --   --  <0.30 <0.30 <0.30  --   --   --   < > = values in this interval not displayed.  Estimated Creatinine Clearance: 40.8 ml/min (by C-G formula based on Cr of 2.26).  Assessment: 63 y.o. male who presented on 7/20 with cardiac arrest/intentional suicide attempt. Patient found with suicide note and 2 restoril bottles by him. INR today is 1.5 with trend down (INR was 3.23 on 7/22). Hg noted 11.4 (down from 13.4 however also noted fluid postitive ~ 2.2 L)  Per warfarin clinic patient is to take 7.5 mg daily except 10 mg on Wed, Sat. Unsure of exact last dose at home.  Goal of Therapy:  INR 2-3 Monitor platelets by anticoagulation protocol: Yes   Plan:  - Coumadin 10mg  po today -Daily PT/INR -May need to consider heparin bridge if INR remains low (with last 3 INRs > 2.6 ? possible spurious lab result)  Hildred Laser, Pharm D 01/06/2014 9:11 AM

## 2014-01-06 NOTE — Progress Notes (Signed)
PULMONARY / CRITICAL CARE MEDICINE   Name: [name] MRN: [id number] DOB: 10-08-1950    ADMISSION DATE:  12/21/2013 CONSULTATION DATE:  12/28/2013  REFERRING MD :  EDP  CHIEF COMPLAINT:  Cardiac Arrest / suicide attempt.  INITIAL PRESENTATION: 63 yo with Bipolar disorder admitted 7/20 after cardiac arrest in setting of Restoril overdose. Dountime unknown. Received  epi x 8, Narcan 8mg , and Glucagon by EMS with ROSC.  STUDIES / EVENTS:  7/20  CT Head  >>> nad 7/21  TTE >>> EF 60%, possible mild hypokinesis of apical myocardium. Grade 1 DD 7/22  EEG >>> Burst suppression with minimal detectable brain activity during bursts  LINES: OETT 7/20 >>> OGT 7/20 >>> Foley 7/20 >>>  CULTURES: 7/20  MRSA PCR >>> neg 7/21  Respiratory >>>   ANTIBIOTIC: Clindamycin 7/20 >>> 7/21 Unasyn 7/21 >>>  INTERVAL HISTORY: Remains with minimal neurological findings.  EEG completed.  VITAL SIGNS: Temp:  [97 F (36.1 C)-99.3 F (37.4 C)] 98.2 F (36.8 C) (07/23 0600) Pulse Rate:  [74-89] 74 (07/23 0600) Resp:  [15-21] 15 (07/23 0600) BP: (134-167)/(73-97) 149/86 mmHg (07/23 0600) SpO2:  [99 %-100 %] 100 % (07/23 0600) FiO2 (%):  [40 %] 40 % (07/23 0600) Weight:  [96.6 kg (212 lb 15.4 oz)] 96.6 kg (212 lb 15.4 oz) (07/23 0439)  HEMODYNAMICS:   VENTILATOR SETTINGS: Vent Mode:  [-] PRVC FiO2 (%):  [40 %] 40 % Set Rate:  [16 bmp] 16 bmp Vt Set:  [620 mL] 620 mL PEEP:  [5 cmH20] 5 cmH20 Plateau Pressure:  [20 cmH20-22 cmH20] 22 cmH20  INTAKE / OUTPUT: Intake/Output     07/22 0701 - 07/23 0700 07/23 0701 - 07/24 0700   I.V. (mL/kg) 2150 (22.3)    NG/GT 1750    IV Piggyback 300    Total Intake(mL/kg) 4200 (43.5)    Urine (mL/kg/hr) 2070 (0.9)    Total Output 2070     Net +2130            PHYSICAL EXAMINATION: General:  Mechanically ventilated, synchronous Neuro:  Sluggish pupils, paucity of neurological findings otherwise HEENT:  PERRL, OETT / OGT Cardiovascular:  RRR, no  m/r/g Lungs:  Scattered rhonchi Abdomen:  Soft, nontender, bowel sounds present Musculoskeletal:  No edema Skin:  Intact  LABS:  CBC  Recent Labs Lab 01/04/14 0315 01/05/14 0226 01/06/14 0215  WBC 5.4 6.8 4.3  HGB 14.7 13.4 11.4*  HCT 44.4 39.8 34.8*  PLT 127* 138* 135*   Coag's  Recent Labs Lab 12/30/2013 1842 01/04/14 1250 01/05/14 0226 01/06/14 0215  APTT 34  --   --   --   INR 2.67* 2.85* 3.23* 1.50*   BMET  Recent Labs Lab 01/04/14 0315 01/05/14 0226 01/06/14 0215  NA 141 141 145  K 4.7 4.7 4.3  CL 102 106 113*  CO2 17* 18* 19  BUN 34* 42* 43*  CREATININE 2.17* 2.66* 2.26*  GLUCOSE 147* 138* 145*   Electrolytes  Recent Labs Lab 12/21/2013 1842 01/04/14 0315 01/05/14 0226 01/06/14 0215  CALCIUM 8.4 8.4 8.9 8.5  MG 2.1  --   --   --    Sepsis Markers  Recent Labs Lab 12/24/2013 2230 01/04/14 0315 01/04/14 1040 01/05/14 0226  LATICACIDVEN 6.1* 5.7* 3.6*  --   PROCALCITON 4.33 16.07  --  22.99   ABG  Recent Labs Lab 12/16/2013 2047 01/04/14 0003 01/04/14 0459  PHART 7.434 7.452* 7.491*  PCO2ART 22.8* 27.5* 24.9*  PO2ART 62.0*  124.0* 140.0*   Liver Enzymes  Recent Labs Lab 12/24/2013 1842 01/04/14 0315 01/05/14 0950  AST 169* 207* 126*  ALT 134* 139* 76*  ALKPHOS 97 80 73  BILITOT <0.2* <0.2* <0.2*  ALBUMIN 2.9* 3.0* 2.6*   Drugs of Abuse     Component Value Date/Time   LABOPIA NONE DETECTED 12/25/2013 1908   COCAINSCRNUR NONE DETECTED 12/31/2013 1908   LABBENZ POSITIVE* 12/31/2013 1908   AMPHETMU NONE DETECTED 01/02/2014 1908   THCU NONE DETECTED 12/17/2013 1908   LABBARB POSITIVE* 01/05/2014 1908    Cardiac Enzymes  Recent Labs Lab 01/06/2014 2105 01/04/14 0315 01/04/14 0745  TROPONINI <0.30 <0.30 <0.30   Glucose  Recent Labs Lab 01/05/14 0839 01/05/14 1153 01/05/14 1550 01/05/14 1949 01/05/14 2337 01/06/14 0340  GLUCAP 149* 151* 127* 136* 144* 141*   IMAGING:   Dg Chest Port 1 View  01/06/2014   CLINICAL  DATA:  Infiltrates.  EXAM: PORTABLE CHEST - 1 VIEW  COMPARISON:  01/05/2014.  FINDINGS: The cardiopericardial silhouette is unchanged. Endotracheal tube remains present with the tip 18 mm from the carina. Enteric tube present. Patient is mildly rotated to the RIGHT there is airspace disease around the RIGHT hilum and RIGHT infrahilar region which appears similar to prior exam. Probable RIGHT pleural effusion. Defibrillator pads have been removed. Cholecystectomy clips are present in the right upper quadrant. Monitoring leads project over the chest.  IMPRESSION: 1. Stable support apparatus. 2. RIGHT basilar atelectasis, perihilar airspace disease and probable small pleural effusion.   Electronically Signed   By: Dereck Ligas M.D.   On: 01/06/2014 07:13   Dg Chest Port 1 View  01/05/2014   CLINICAL DATA:  Assess endotracheal tube  EXAM: PORTABLE CHEST - 1 VIEW  COMPARISON:  Prior chest x-ray 01/04/2014  FINDINGS: The endotracheal tube is 2.5 cm above the carina. The nasogastric tube is incompletely imaged. The tip lies off the field of view, below the diaphragm and likely within the stomach. External defibrillator pad projects over the upper chest. Cardiac and mediastinal contours remain unchanged. Decreasing aeration in the lung bases over the last 24 hr. Likely new layering pleural effusion on the right with associated right basilar atelectasis. The left lung is relatively clear despite the lower inspiratory volumes and mild subsegmental atelectasis. No overt pulmonary edema. Osseous structures intact and unremarkable for age.  IMPRESSION: 1. New veil like opacity in the right lung base most consistent with a developing layering pleural effusion and superimposed atelectasis. In the appropriate clinical setting, aspiration could have a similar appearance. 2. Slightly lower inspiratory volumes. 3. Stable subsegmental atelectasis in the left lung base. 4. Stable and satisfactory support apparatus.    Electronically Signed   By: Jacqulynn Cadet M.D.   On: 01/05/2014 07:33   ASSESSMENT / PLAN:  PULMONARY A: Acute respiratory failure in setting of benzo / barbiturates overdose Probable aspiration pneumonia OSA on CPAP P:   Goal pH>7.30, SpO2>92 Continuous mechanical support VAP bundle Daily SBT but mental status is limiting extubation May need early tracheostomy Trend ABG/CXR  CARDIOVASCULAR A:  Cardiac arrest likely secondary the respiratory event Transient hypotension, resolved No evidence of sichemia Chronic diastolic heart faialure P:  Goal MAP>65 TTE Restart Norvasc  RENAL A:   Acute on chronic renal failure Renal CA s/p R nephrectomy P:   Trend BMP NS@100   GASTROINTESTINAL A:   Elevated transaminases, likely shocked liver in setting of arrest Doubt Tylenol toxicity ( level < 15 ) GERD on PPI P:  NPO TF per Nutritionist Protonix Repeat LFT  HEMATOLOGIC A:   H/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 Coagulopathy P:  Trend CBC Coumadin per Pharmacy - restart when appropriate   INFECTIOUS A:   Probable aspiration pneumonia P:   Cx / abx as above   ENDOCRINE A:   Hypothyroidism Hyperglycemia  P:   SSI Synthroid  NEUROLOGIC A:   Bipolar mood disorder Benzo / barbituate overdose Acute encephalopathy Possible anoxia Burst suppression on EEG 7/22, Primidone / barbiturates may be contributing P:   Goal RASS 0 to -1 Hold preadmission Depakote, Lurasidone, Primidone, Temazepam) Off sedation MRI Barbiturate level  I have personally obtained history, examined patient, evaluated and interpreted laboratory and imaging results, reviewed medical records, formulated assessment / plan and placed orders.  CRITICAL CARE:  The patient is critically ill with multiple organ systems failure and requires high complexity decision making for assessment and support, frequent evaluation and titration of therapies, application of advanced monitoring technologies and extensive  interpretation of multiple databases. Critical Care Time devoted to patient care services described in this note is 35 minutes.   Doree Fudge, MD [facility]monary and [place] Pager: [phone]  01/06/2014, 8:17 AM

## 2014-01-06 NOTE — Progress Notes (Signed)
Pt failed SBT d/t low min vol, low vt, increased PS to 15, pt tol well.  Pt placed back on full support per MD plans d/t pt going to MRI this  Morning.

## 2014-01-06 NOTE — Progress Notes (Signed)
Chaplain responded to request by Nursing Unit to provide pastoral support to family members of patient as end of life care was to begin. Chaplain presented to patients wife, and other family members present and offered words of encouragement and comfort. The family reported that they were traveling home to [facility] for the evening and would return on tomorrow to make final decisions on care of patient.  They were appreciative of all support at this very difficult time for their family.Chaplain will continue to follow-up with Nursing Staff on the patients medical status. [place],  [phone]

## 2014-01-07 DIAGNOSIS — Z515 Encounter for palliative care: Secondary | ICD-10-CM

## 2014-01-07 LAB — CULTURE, RESPIRATORY W GRAM STAIN

## 2014-01-07 LAB — CULTURE, RESPIRATORY

## 2014-01-13 NOTE — Discharge Summary (Signed)
Name: [name] MRN: [id number] DOB: 1951-02-13  PCCM DEATH NOTE  Time of death:  01-22-2014  2:55 PM  Cause of death: Barbiturates overdose  Discharge diagnoses: Acute respiratory failure in setting of benzo / barbiturates overdose  Aspiration pneumonia  Cardiac arrest likely secondary the respiratory event  OSA  Chronic diastolic heart faialure  Acute on chronic renal failure  Renal CA s/p R nephrectomy  Elevated transaminases, likely shocked liver in setting of arrest  GERD  DVT  Hypothyroidism  Hyperglycemia  Bipolar mood disorder  Benzo / barbituate overdose  Acute encephalopathy  Severe anoxia  Brief hospital course:  63 yo with Bipolar disorder admitted 7/20 after cardiac arrest in setting of benzodiazepine / barbiturates overdose. Dountime unknown. Received epi x 8, Narcan 8mg , and Glucagon by EMS with ROSC.  The patient's condition, hospital course, ongoing treatment and prognosis were discussed with the family.  Questions were answered.  The consensus was reached that following patient's wishes no cardiopulmonary resuscitation should be attempted and comfort measures should be pursued.  Life support was withdrawn and comfort measures provided.  Patient expired shortly after.  No resuscitation was attempted.  Doree Fudge, MD [facility] and [place] Pager: [phone]

## 2014-01-15 NOTE — Plan of Care (Signed)
Problem: Consults Goal: Nutrition Consult-if indicated Outcome: Not Met (add Reason) expired Goal: Diabetes Guidelines if Diabetic/Glucose > 140 If diabetic or lab glucose is > 140 mg/dl - Initiate Diabetes/Hyperglycemia Guidelines & Document Interventions  Outcome: Not Met (add Reason) expired  Problem: Phase II Progression Outcomes Goal: Date pt extubated/weaned off vent expired Goal: Time pt extubated/weaned off vent Outcome: Not Met (add Reason) expired Goal: Hemodynamically stable Outcome: Not Met (add Reason) expired Goal: Progress activities as ordered Outcome: Not Met (add Reason) expired Goal: Tolerating prescribed nutrition plan Outcome: Not Met (add Reason) expired

## 2014-01-15 NOTE — ED Provider Notes (Signed)
Medical screening examination/treatment/procedure(s) were conducted as a shared visit with non-physician practitioner(s) or resident and myself. I personally evaluated the patient during the encounter and agree with the findings.  I have personally reviewed any xrays and/ or EKG's with the provider and I agree with interpretation.  Patient presents with EMS after being found pulseless and apneic. Pt has a suicide note a bottle of benzo found per ems. Pt has ROSC with CPR/ narcan. Repeat narcan given on route with no improvement. Pt not responsive to painful stimuli in ED, vomit on face, dry mm, abd soft/ ND, no spont movement of ext, no family on arrival. Spoke with critical care and they requested not cooling as un witnessed and unknown down time.  EMERGENCY DEPARTMENT [place] CARDIAC EXAM  "Study: Limited Ultrasound of the heart and pericardium"  INDICATIONS:unresponsive  Multiple views of the heart and pericardium were obtained in real-time with a multi-frequency probe.  PERFORMED EL:FYBOFB and resident  IMAGES ARCHIVED?: yes  FINDINGS: hyperdynamic heart, no effusion  LIMITATIONS: emergent  VIEWS USED: psl, PSS  INTERPRETATION: hyperdynamic, no effusion  Ct Head Wo Contrast  01/11/2014 CLINICAL DATA: Found down. Altered mental status. Possible suicide attempt. EXAM: CT HEAD WITHOUT CONTRAST CT CERVICAL SPINE WITHOUT CONTRAST TECHNIQUE: Multidetector CT imaging of the head and cervical spine was performed following the standard protocol without intravenous contrast. Multiplanar CT image reconstructions of the cervical spine were also generated. COMPARISON: Head CT 08/14/2013. FINDINGS: CT HEAD FINDINGS No acute intracranial abnormalities. Specifically, no evidence of acute intracranial hemorrhage, no definite findings of acute/subacute cerebral ischemia, no mass, mass effect, hydrocephalus or abnormal intra or extra-axial fluid collections. No acute displaced skull fractures are identified. Mastoids are  well pneumatized bilaterally. Paranasal sinuses are remarkable for some multifocal mucosal thickening throughout the ethmoid sinuses bilaterally, and a large air-fluid level in the left sphenoid sinus. CT CERVICAL SPINE FINDINGS Patient is intubated, and an orogastric tube is in position, limiting assessment for prevertebral soft tissue swelling. No acute displaced fractures are noted. Alignment is anatomic. Within the limitations of this examination, the prevertebral soft tissues appear grossly normal. Multilevel degenerative disc disease, most severe at C4-C5, C5-C6 and C6-C7. Mild multilevel facet arthropathy. Dependent opacities in the posterior aspects of the upper lobes of the lungs bilaterally are noted, potentially sequela of recent aspiration. IMPRESSION: 1. No acute intracranial abnormalities. 2. No acute abnormality of the cervical spine. 3. Extensive dependent opacities in the upper lobes of the lungs bilaterally (right greater than left), likely sequela of recent aspiration. 4. Air-fluid level in the left sphenoid sinus may suggest acute sinusitis. 5. Mild multilevel degenerative disc disease and cervical spondylosis, as above. Electronically Signed By: Vinnie Langton M.D. On: 12/24/2013 20:32  Ct Cervical Spine Wo Contrast  12/23/2013 CLINICAL DATA: Found down. Altered mental status. Possible suicide attempt. EXAM: CT HEAD WITHOUT CONTRAST CT CERVICAL SPINE WITHOUT CONTRAST TECHNIQUE: Multidetector CT imaging of the head and cervical spine was performed following the standard protocol without intravenous contrast. Multiplanar CT image reconstructions of the cervical spine were also generated. COMPARISON: Head CT 08/14/2013. FINDINGS: CT HEAD FINDINGS No acute intracranial abnormalities. Specifically, no evidence of acute intracranial hemorrhage, no definite findings of acute/subacute cerebral ischemia, no mass, mass effect, hydrocephalus or abnormal intra or extra-axial fluid collections. No acute  displaced skull fractures are identified. Mastoids are well pneumatized bilaterally. Paranasal sinuses are remarkable for some multifocal mucosal thickening throughout the ethmoid sinuses bilaterally, and a large air-fluid level in the left sphenoid sinus. CT CERVICAL SPINE  FINDINGS Patient is intubated, and an orogastric tube is in position, limiting assessment for prevertebral soft tissue swelling. No acute displaced fractures are noted. Alignment is anatomic. Within the limitations of this examination, the prevertebral soft tissues appear grossly normal. Multilevel degenerative disc disease, most severe at C4-C5, C5-C6 and C6-C7. Mild multilevel facet arthropathy. Dependent opacities in the posterior aspects of the upper lobes of the lungs bilaterally are noted, potentially sequela of recent aspiration. IMPRESSION: 1. No acute intracranial abnormalities. 2. No acute abnormality of the cervical spine. 3. Extensive dependent opacities in the upper lobes of the lungs bilaterally (right greater than left), likely sequela of recent aspiration. 4. Air-fluid level in the left sphenoid sinus may suggest acute sinusitis. 5. Mild multilevel degenerative disc disease and cervical spondylosis, as above. Electronically Signed By: Vinnie Langton M.D. On: 12/30/2013 20:32  Dg Chest Port 1 View  01/04/2014 CLINICAL DATA: Intubation. EXAM: PORTABLE CHEST - 1 VIEW COMPARISON: 12/22/2013 FINDINGS: Endotracheal tube noted 2.6 cm above the carina. NG tube noted with tip below the left hemidiaphragm. Mild basilar atelectasis. No pleural effusion or pneumothorax. Heart size and pulmonary vascularity normal. No acute osseus abnormality. IMPRESSION: 1. Endotracheal tube noted 2.6 cm above the carina. NG tube below left hemidiaphragm. 2. Mild bibasilar atelectasis. Electronically Signed By: Marcello Moores Register On: 01/04/2014 07:29  Dg Chest Portable 1 View  12/17/2013 CLINICAL DATA: Post CPR. EXAM: PORTABLE CHEST - 1 VIEW COMPARISON:  07/25/2013 FINDINGS: Endotracheal tube is 3 cm above the carina. Heart is normal size. Lungs are clear. No effusions or pneumothorax. Significant gaseous distention of the stomach. IMPRESSION: Endotracheal tube 3 cm above the carina. No acute cardiopulmonary disease. Significant gaseous distention of the stomach. Electronically Signed By: Rolm Baptise M.D. On: 12/26/2013 19:25  Dg Abd Portable 1v  12/20/2013 CLINICAL DATA: Nasogastric tube placement. EXAM: PORTABLE ABDOMEN - 1 VIEW COMPARISON: Acute abdominal series done 12/10/2012. FINDINGS: 2241 hr. Nasogastric tube tip is in the descending duodenum. External pacer and multiple telemetry leads overlie the upper abdomen and lower chest. There are multiple surgical clips in the right abdomen. The visualized bowel gas pattern is normal. Lumbar spine degenerative changes are stable. IMPRESSION: Nasogastric tube tip in the descending duodenum. Stable nonobstructive bowel gas pattern. Electronically Signed By: Camie Patience M.D. On: 12/15/2013 23:40   CRITICAL CARE Performed by: Mariea Clonts  Total critical care time: 60 min  Critical care time was exclusive of separately billable procedures and treating other patients.  Critical care was necessary to treat or prevent imminent or life-threatening deterioration.  Critical care was time spent personally by me on the following activities: development of treatment plan with patient and/or surrogate as well as nursing, discussions with consultants, evaluation of patient's response to treatment, examination of patient, obtaining history from patient or surrogate, ordering and performing treatments and interventions, ordering and review of laboratory studies, ordering and review of radiographic studies, pulse oximetry and re-evaluation of patient's condition.    Mariea Clonts, MD 01-11-2014 [phone]

## 2014-01-15 NOTE — Progress Notes (Signed)
PULMONARY / CRITICAL CARE MEDICINE   Name: [name] MRN: [id number] DOB: 1950-07-11    ADMISSION DATE:  12/30/2013 CONSULTATION DATE:  01/14/2014  REFERRING MD :  EDP  CHIEF COMPLAINT:  Cardiac Arrest / suicide attempt.  INITIAL PRESENTATION: 63 yo with Bipolar disorder admitted 7/20 after cardiac arrest in setting of Restoril overdose. Dountime unknown. Received  epi x 8, Narcan 8mg , and Glucagon by EMS with ROSC.  STUDIES / EVENTS:  7/20  CT Head  >>> nad 7/21  TTE >>> EF 60%, possible mild hypokinesis of apical myocardium. Grade 1 DD 7/22  EEG >>> Burst suppression with minimal detectable brain activity during bursts 7/23  MRI >>> Severe anoxic injury, bilateral infarcts, edema 7/23  Neurology consulted >>> extremely poor prognosis.  Goals of care meeting >>> comfort care.  LINES: OETT 7/20 >>> OGT 7/20 >>> Foley 7/20 >>>  CULTURES: 7/20  MRSA PCR >>> neg 7/21  Respiratory >>> npof  ANTIBIOTIC: Clindamycin 7/20 >>> 7/21 Unasyn 7/21 >>> 7/23  INTERVAL HISTORY: No acute events overnight.  VITAL SIGNS: Temp:  [96.4 F (35.8 C)-98.2 F (36.8 C)] 96.4 F (35.8 C) (07/24 0700) Pulse Rate:  [62-98] 69 (07/24 0700) Resp:  [12-28] 16 (07/24 0700) BP: (144-192)/(84-105) 188/97 mmHg (07/24 0700) SpO2:  [92 %-100 %] 99 % (07/24 0700) FiO2 (%):  [40 %] 40 % (07/24 0700)  HEMODYNAMICS:   VENTILATOR SETTINGS: Vent Mode:  [-] PRVC FiO2 (%):  [40 %] 40 % Set Rate:  [16 bmp] 16 bmp Vt Set:  [620 mL] 620 mL PEEP:  [5 cmH20] 5 cmH20 Plateau Pressure:  [19 cmH20-22 cmH20] 20 cmH20  INTAKE / OUTPUT: Intake/Output     07/23 0701 - 07/24 0700 07/24 0701 - 07/25 0700   I.V. (mL/kg) 300 (3.1)    NG/GT 661.8    IV Piggyback 50    Total Intake(mL/kg) 1011.8 (10.5)    Urine (mL/kg/hr) 1843 (0.8)    Total Output 1843     Net -831.2            PHYSICAL EXAMINATION: General:  Mechanically ventilated, synchronous Neuro:  Sluggish pupils, gag / cough absent HEENT:  PERRL,  OETT / OGT Cardiovascular:  RRR, no m/r/g Lungs:  Rhonchi bilaterally Abdomen:  Soft, nontender, bowel sounds present Musculoskeletal:  No edema Skin:  Intact  LABS:  CBC  Recent Labs Lab 01/04/14 0315 01/05/14 0226 01/06/14 0215  WBC 5.4 6.8 4.3  HGB 14.7 13.4 11.4*  HCT 44.4 39.8 34.8*  PLT 127* 138* 135*   Coag's  Recent Labs Lab 12/28/2013 1842 01/04/14 1250 01/05/14 0226 01/06/14 0215  APTT 34  --   --   --   INR 2.67* 2.85* 3.23* 1.50*   BMET  Recent Labs Lab 01/04/14 0315 01/05/14 0226 01/06/14 0215  NA 141 141 145  K 4.7 4.7 4.3  CL 102 106 113*  CO2 17* 18* 19  BUN 34* 42* 43*  CREATININE 2.17* 2.66* 2.26*  GLUCOSE 147* 138* 145*   Electrolytes  Recent Labs Lab 12/25/2013 1842 01/04/14 0315 01/05/14 0226 01/06/14 0215  CALCIUM 8.4 8.4 8.9 8.5  MG 2.1  --   --   --    Sepsis Markers  Recent Labs Lab 12/16/2013 2230 01/04/14 0315 01/04/14 1040 01/05/14 0226  LATICACIDVEN 6.1* 5.7* 3.6*  --   PROCALCITON 4.33 16.07  --  22.99   ABG  Recent Labs Lab 12/18/2013 2047 01/04/14 0003 01/04/14 0459  PHART 7.434 7.452* 7.491*  PCO2ART  22.8* 27.5* 24.9*  PO2ART 62.0* 124.0* 140.0*   Liver Enzymes  Recent Labs Lab 12/29/2013 1842 01/04/14 0315 01/05/14 0950  AST 169* 207* 126*  ALT 134* 139* 76*  ALKPHOS 97 80 73  BILITOT <0.2* <0.2* <0.2*  ALBUMIN 2.9* 3.0* 2.6*   Drugs of Abuse     Component Value Date/Time   LABOPIA NONE DETECTED 01/02/2014 1908   COCAINSCRNUR NONE DETECTED  1908   LABBENZ POSITIVE* 12/15/2013 1908   AMPHETMU NONE DETECTED 12/25/2013 1908   THCU NONE DETECTED 12/25/2013 1908   LABBARB POSITIVE* 01/01/2014 1908    Cardiac Enzymes  Recent Labs Lab 12/27/2013 2105 01/04/14 0315 01/04/14 0745  TROPONINI <0.30 <0.30 <0.30   Glucose  Recent Labs Lab 01/05/14 1949 01/05/14 2337 01/06/14 0340 01/06/14 0735 01/06/14 1234 01/06/14 1611  GLUCAP 136* 144* 141* 142* 109* 151*   IMAGING:   Mr Jeri Cos Wo Contrast  01/06/2014   CLINICAL DATA:  63 year old male found down, unresponsive, remains obtunded. Query anoxia. History of renal cell carcinoma, blood clots. Initial encounter.  EXAM: MRI HEAD WITHOUT AND WITH CONTRAST  TECHNIQUE: Multiplanar, multiecho pulse sequences of the brain and surrounding structures were obtained without and with intravenous contrast.  CONTRAST:  20 mL MultiHance.  COMPARISON:  Head CT without contrast 01/02/2014. Brain MRI 07/27/2013.  FINDINGS: Diffusely abnormal gray matter diffusion, with widespread diffusion restriction in a symmetric pattern affecting both cerebral and cerebellar hemispheres. Cerebral white matter and the brainstem are relatively spared.  Associated diffuse severe cortical edema with T2 and FLAIR hyperintensity. Similar widespread deep gray matter nuclei and cerebellar hemisphere edema. Subsequent mass effect with compression on the ventricular system, and partial loss of basilar cisterns.  No acute intracranial hemorrhage identified. Major intracranial vascular flow voids are stable. No abnormal enhancement identified. No discrete intracranial mass lesion. Negative pituitary, cervicomedullary junction and visualized cervical spine. Normal bone marrow signal. Stable orbits soft tissues except for MRI evidence of papilledema.  Intubated, with mastoid effusions, paranasal sinus fluid, and fluid in the pharynx. Visualized scalp soft tissues are within normal limits.  IMPRESSION: Appearance compatible with severe a anoxic injury; diffuse cortical, deep gray matter, and cerebellar infarcts with edema.   Electronically Signed   By: Lars Pinks M.D.   On: 01/06/2014 13:29   Dg Chest Port 1 View  01/06/2014   CLINICAL DATA:  Infiltrates.  EXAM: PORTABLE CHEST - 1 VIEW  COMPARISON:  01/05/2014.  FINDINGS: The cardiopericardial silhouette is unchanged. Endotracheal tube remains present with the tip 18 mm from the carina. Enteric tube present. Patient is mildly rotated  to the RIGHT there is airspace disease around the RIGHT hilum and RIGHT infrahilar region which appears similar to prior exam. Probable RIGHT pleural effusion. Defibrillator pads have been removed. Cholecystectomy clips are present in the right upper quadrant. Monitoring leads project over the chest.  IMPRESSION: 1. Stable support apparatus. 2. RIGHT basilar atelectasis, perihilar airspace disease and probable small pleural effusion.   Electronically Signed   By: Dereck Ligas M.D.   On: 01/06/2014 07:13   ASSESSMENT / PLAN:  Acute respiratory failure in setting of benzo / barbiturates overdose Aspiration pneumonia Cardiac arrest likely secondary the respiratory event OSA Chronic diastolic heart faialure  Acute on chronic renal failure Renal CA s/p R nephrectomy Elevated transaminases, likely shocked liver in setting of arrest GERD DVT Hypothyroidism Hyperglycemia  Bipolar mood disorder Benzo / barbituate overdose Acute encephalopathy Severe anoxia   Comfort measures per family request  Withdrawal of  life sustaining measures later today when family arrives  DNR  I have personally obtained history, examined patient, evaluated and interpreted laboratory and imaging results, reviewed medical records, formulated assessment / plan and placed orders.  Doree Fudge, MD Pulmonary and [place] Pager: [phone]  01/11/2014, 8:50 AM

## 2014-01-15 NOTE — Progress Notes (Signed)
Withdrawal of life support protocol initiated.  Family, chaplin, and RN at bedside.  All questions answered.

## 2014-01-15 NOTE — Procedures (Signed)
Extubation Procedure Note  Patient Details:   Name: [name] DOB: 1950/07/22 MRN: [id number]   Airway Documentation:     Evaluation  O2 sats: withdrawal of life support initiated Complications: No apparent complications Patient did tolerate procedure well. Bilateral Breath Sounds: Diminished (coarse) Suctioning: Airway Pt appeared comfortable t/o weaning process, no distress or discomfort noted.  Pt extubated per family wishes. Family, chaplin, and RN at bedside. Pt extubated to room air.   Lenna Sciara 2014/01/25, 2:49 PM

## 2014-01-15 NOTE — Progress Notes (Signed)
Expiration note:  Patient expired at 1455. No breath sounds or heart sounds were auscultated for one minute. Paula Compton, RN at bedside to verify. Family at bedside, all questions were answered. [facility] was contacted and the patients referral was completed. Dr. Oliver Pila notified and made aware or patients expiration.

## 2014-01-15 NOTE — Progress Notes (Signed)
Narcotic Note:  85 ml Morphine wasted in sink and 35 ml versed wasted in sink. [facility], RN present to verify.

## 2014-01-15 DEATH — deceased

## 2014-01-25 ENCOUNTER — Ambulatory Visit: Payer: Self-pay | Admitting: *Deleted

## 2014-02-11 ENCOUNTER — Ambulatory Visit: Payer: Self-pay | Admitting: Cardiology

## 2014-03-17 NOTE — Telephone Encounter (Signed)
Duplicate. See other message same date.
